# Patient Record
Sex: Male | Born: 1980
Health system: Southern US, Community
[De-identification: ages and names within clinical notes are randomized; demographics above are authoritative.]

## PROBLEM LIST (undated history)

## (undated) DIAGNOSIS — Z8669 Personal history of other diseases of the nervous system and sense organs: Secondary | ICD-10-CM

## (undated) DIAGNOSIS — I10 Essential (primary) hypertension: Secondary | ICD-10-CM

## (undated) DIAGNOSIS — E119 Type 2 diabetes mellitus without complications: Secondary | ICD-10-CM

## (undated) DIAGNOSIS — E039 Hypothyroidism, unspecified: Secondary | ICD-10-CM

## (undated) DIAGNOSIS — M7989 Other specified soft tissue disorders: Secondary | ICD-10-CM

## (undated) DIAGNOSIS — E079 Disorder of thyroid, unspecified: Secondary | ICD-10-CM

## (undated) DIAGNOSIS — R0602 Shortness of breath: Secondary | ICD-10-CM

## (undated) HISTORY — DX: Type 2 diabetes mellitus without complications: E11.9

## (undated) HISTORY — DX: Disorder of thyroid, unspecified: E07.9

## (undated) HISTORY — DX: Hypothyroidism, unspecified: E03.9

## (undated) HISTORY — DX: Personal history of other diseases of the nervous system and sense organs: Z86.69

## (undated) HISTORY — DX: Shortness of breath: R06.02

## (undated) HISTORY — DX: Other specified soft tissue disorders: M79.89

## (undated) HISTORY — DX: Essential (primary) hypertension: I10

---

## 2005-08-05 ENCOUNTER — Encounter: Admission: RE | Admit: 2005-08-05 | Discharge: 2005-09-21 | Payer: Self-pay | Admitting: Family Medicine

## 2010-12-20 ENCOUNTER — Encounter: Payer: Self-pay | Attending: Endocrinology | Admitting: Dietician

## 2013-09-22 HISTORY — PX: WISDOM TOOTH EXTRACTION: SHX21

## 2014-01-10 ENCOUNTER — Ambulatory Visit (INDEPENDENT_AMBULATORY_CARE_PROVIDER_SITE_OTHER): Payer: Managed Care, Other (non HMO)

## 2014-01-10 ENCOUNTER — Encounter: Payer: Self-pay | Admitting: Podiatry

## 2014-01-10 ENCOUNTER — Ambulatory Visit (INDEPENDENT_AMBULATORY_CARE_PROVIDER_SITE_OTHER): Payer: Managed Care, Other (non HMO) | Admitting: Podiatry

## 2014-01-10 VITALS — BP 138/84 | HR 81 | Resp 16 | Ht 68.5 in | Wt 228.0 lb

## 2014-01-10 DIAGNOSIS — M79672 Pain in left foot: Secondary | ICD-10-CM

## 2014-01-10 DIAGNOSIS — M79609 Pain in unspecified limb: Secondary | ICD-10-CM

## 2014-01-10 DIAGNOSIS — M79671 Pain in right foot: Secondary | ICD-10-CM

## 2014-01-10 DIAGNOSIS — L600 Ingrowing nail: Secondary | ICD-10-CM

## 2014-01-10 MED ORDER — NEOMYCIN-POLYMYXIN-HC 3.5-10000-1 OT SOLN: OTIC | Status: DC

## 2014-01-10 NOTE — Patient Instructions (Signed)

## 2014-01-10 NOTE — Progress Notes (Signed)
   Subjective:    Patient ID: Andre Brown, male    DOB: 1981/08/27, 33 y.o.   MRN: [id number]  HPI Comments: i am a diabetic and i noticed the other night that my right great toenail had pus coming out of it. Both great toenails are sore. The right toe has got better, was soaking in epsom salt.   Last a1c was 7.8   Review of Systems  Endocrine:       Diabetic  All other systems reviewed and are negative.      Objective:   Physical Exam: I reviewed his past medical history medications allergies surgeries social history and review of systems. Pulses are strongly palpable bilateral. Neurologic sensorium is intact per Semmes-Weinstein monofilament. Deep tendon reflexes are intact and brisk bilateral. Muscle strength is 5 over 5 dorsiflexors plantar flexors inverters everters all intrinsic musculature is intact orthopedic evaluation demonstrates all joints distal to the ankle a full range of motion without crepitation. Radiographic evaluation does not demonstrate any type of osseous abnormalities. Cutaneous evaluation does demonstrate sharp incurvated nail margins with mild erythema to the tibial border hallux bilateral.        Assessment & Plan:  Assessment: Ingrown nail hallux bilateral with insulin-dependent diabetes mellitus.  Plan: We discussed the etiology pathology conservative versus surgical therapies. At this point we did discuss the pros and cons of performing a chemical matrixectomy. At this point I believe the benefits outweigh the risks. So today we performed a chemical matrixectomy to the hallux tibial border with local anesthetic. He tolerated procedure well and I expect him to heal fully from this. He will watch his sugars very carefully and will followup with me in one week he will soak twice daily in Betadine and water and Cortisporin Otic will be applied for which we have called in a prescription. I will followup with him in one week should he develop fever chills  nausea or vomiting increase in his blood sugar or pain in his toes he'll notify us immediately.

## 2014-01-17 ENCOUNTER — Encounter: Payer: Self-pay | Admitting: Podiatry

## 2014-01-17 ENCOUNTER — Ambulatory Visit (INDEPENDENT_AMBULATORY_CARE_PROVIDER_SITE_OTHER): Payer: Managed Care, Other (non HMO) | Admitting: Podiatry

## 2014-01-17 VITALS — BP 156/80 | HR 76 | Resp 12

## 2014-01-17 DIAGNOSIS — Z9889 Other specified postprocedural states: Secondary | ICD-10-CM

## 2014-01-17 NOTE — Progress Notes (Signed)
He presents today for followup of her matrixectomy hallux bilateral lateral corners. He denies fever chills nausea vomiting muscle aches and pains he states he continues to soak in Betadine and water. He continues to Cortisporin otic as directed.  Objective: Vital signs are stable he is alert and oriented x3. Pulses are strongly palpable bilateral. There is no erythema edema cellulitis drainage or odor at the surgical sites. The appear to be healing nicely.  Assessment: Well-healing surgical toes hallux bilateral.  Plan: Discontinue the use of Betadine start with Epsom salts and water continue to soak twice daily apply Cortisporin otic twice daily covered in today only continue to do all this until completely healed.

## 2014-01-27 ENCOUNTER — Ambulatory Visit: Payer: Self-pay | Admitting: Podiatrist

## 2014-03-28 ENCOUNTER — Encounter: Payer: Self-pay | Admitting: Podiatry

## 2014-03-28 ENCOUNTER — Ambulatory Visit (INDEPENDENT_AMBULATORY_CARE_PROVIDER_SITE_OTHER): Payer: Managed Care, Other (non HMO) | Admitting: Podiatry

## 2014-03-28 VITALS — BP 136/86 | HR 65 | Resp 16

## 2014-03-28 DIAGNOSIS — L600 Ingrowing nail: Secondary | ICD-10-CM

## 2014-03-28 NOTE — Progress Notes (Signed)
He presents today with a chief complaint of a painful red tibial border to the hallux right. Several months ago we had performed a chemical matrixectomy bilaterally the left foot the great the right foot at some issues.  Objective: Vital signs are stable he is alert and oriented x3. Erythema and edema along the tibial border of the hallux right with exquisite pain.  Assessment: Probably has a spicule of nail with mild paronychia hallux right tibial border.  Plan: At this point we performed a small I&D with excision of a nail spicule this was performed after 3 cc of a 50-50 mixture of Marcaine plain and lidocaine plain was injected in a hallux block. He tolerated procedure well and I will followup with him in one week if necessary or on a when necessary basis.

## 2014-04-04 ENCOUNTER — Ambulatory Visit: Payer: Managed Care, Other (non HMO) | Admitting: Podiatry

## 2014-08-05 ENCOUNTER — Ambulatory Visit (INDEPENDENT_AMBULATORY_CARE_PROVIDER_SITE_OTHER): Payer: Managed Care, Other (non HMO) | Admitting: Family Medicine

## 2014-08-05 VITALS — BP 112/70 | HR 75 | Temp 98.4°F | Resp 18 | Ht 69.0 in | Wt 228.6 lb

## 2014-08-05 DIAGNOSIS — R3915 Urgency of urination: Secondary | ICD-10-CM

## 2014-08-05 DIAGNOSIS — M545 Low back pain, unspecified: Secondary | ICD-10-CM

## 2014-08-05 DIAGNOSIS — E119 Type 2 diabetes mellitus without complications: Secondary | ICD-10-CM

## 2014-08-05 LAB — POCT UA - MICROSCOPIC ONLY
Bacteria, U Microscopic: NEGATIVE
CRYSTALS, UR, HPF, POC: NEGATIVE
Casts, Ur, LPF, POC: NEGATIVE
EPITHELIAL CELLS, URINE PER MICROSCOPY: NEGATIVE
Mucus, UA: NEGATIVE
RBC, urine, microscopic: NEGATIVE
WBC, UR, HPF, POC: NEGATIVE
Yeast, UA: NEGATIVE

## 2014-08-05 LAB — POCT CBC
Granulocyte percent: 61.5 %G (ref 37–80)
HCT, POC: 49 % (ref 43.5–53.7)
Hemoglobin: 15.9 g/dL (ref 14.1–18.1)
LYMPH, POC: 2.4 (ref 0.6–3.4)
MCH: 29 pg (ref 27–31.2)
MCHC: 32.4 g/dL (ref 31.8–35.4)
MCV: 86.5 fL (ref 80–97)
MID (CBC): 0.4 (ref 0–0.9)
MPV: 8.7 fL (ref 0–99.8)
PLATELET COUNT, POC: 257 10*3/uL (ref 142–424)
POC GRANULOCYTE: 4.5 (ref 2–6.9)
POC LYMPH %: 33.1 % (ref 10–50)
POC MID %: 5.4 %M (ref 0–12)
RBC: 5.47 M/uL (ref 4.69–6.13)
RDW, POC: 13 %
WBC: 7.3 10*3/uL (ref 4.6–10.2)

## 2014-08-05 LAB — POCT URINALYSIS DIPSTICK
Bilirubin, UA: NEGATIVE
GLUCOSE UA: NEGATIVE
Leukocytes, UA: NEGATIVE
NITRITE UA: NEGATIVE
PROTEIN UA: NEGATIVE
Spec Grav, UA: >=1.03
UROBILINOGEN UA: 0.2
pH, UA: 5.5

## 2014-08-05 MED ORDER — CIPROFLOXACIN HCL 500 MG PO TABS: 500.0000 mg | ORAL_TABLET | Freq: Two times a day (BID) | ORAL | Status: DC

## 2014-08-05 NOTE — Patient Instructions (Signed)
We can start antibiotic for possible prostate infection, but if PSA blood test normal (should receive a call in next few days), could stop antibiotic at that time. Will call you about other tests. Remind us when we call you with these results, and we can send these to your primary provider and endocrinologist. Return to the clinic or go to the nearest emergency room if any of your symptoms worsen or new symptoms occur.

## 2014-08-05 NOTE — Progress Notes (Signed)
Subjective:    Patient ID: Andre Brown, male    DOB: [birth year], 33 y.o.   MRN: [id number] This chart was scribed for Shade Flood, MD by Julian Hy, ED Scribe. The patient was seen in Room 9. The patient's care was started at 1:54 PM.   08/05/2014  Chief Complaint  Patient presents with  . Back Pain    lower back pain x1 week or more  . Urinary Frequency    pt states he has an strong urgency to go; however often it is only a small urine flow; denies fever/chills  . Other    pt wants lab work done as well     HPI HPI Comments: He is a new pt to our office. Andre Brown is a 33 y.o. male who presents to the [facility] complaining of new, constant, moderate gradually worsening lower back pain onset one week ago. Pt denies trauma or injury. He localizes his pain to the middle of his lower back bilaterally. Pt has associated pressure with urination and urgency. He states it feels like he's been holding his urine all day regardless of whether he recently urinated or not. He notes his wife had a UTI about 1.5 week ago before his symptoms began. He denies taking any medications to relieve his symptoms. He denies having past medical history of back pain.  Pt has a hx of diabetes and is followed Dr. Talmage Nap. He last saw Dr. Talmage Nap 4-5 months ago with an A1C of 7.1. Pt notes he regularly takes Lantus and his diabetes is controlled at this time. He denies past medical history of prostate infection, UTI, or bladder infection. He has a past medical history of gonorrhea several years ago. He denies concern of STI at this time. Pt denies dysuria, dyschezia, nausea, vomiting, fevers, or chills.  PCP: Dr. Juline Patch at [facility]   There are no active problems to display for this patient.  Past Medical History  Diagnosis Date  . Diabetes mellitus without complication   . Hypertension   . Thyroid disease    History reviewed. No pertinent past surgical  history. No Known Allergies Prior to Admission medications   Medication Sig Start Date End Date Taking? Authorizing Provider  Insulin Aspart (NOVOLOG FLEXPEN [place]) Inject into the skin.   Yes Historical Provider, MD  insulin glargine (LANTUS) 100 UNIT/ML injection Inject 90 Units into the skin at bedtime.   Yes Historical Provider, MD  levothyroxine (SYNTHROID, LEVOTHROID) 25 MCG tablet Take 25 mcg by mouth daily before breakfast.   Yes Historical Provider, MD  lisinopril (PRINIVIL,ZESTRIL) 10 MG tablet Take 10 mg by mouth daily.   Yes Historical Provider, MD   History   Social History  . Marital Status: Married    Spouse Name: N/A    Number of Children: N/A  . Years of Education: N/A   Occupational History  . Not on file.   Social History Main Topics  . Smoking status: Never Smoker   . Smokeless tobacco: Never Used  . Alcohol Use: Yes  . Drug Use: No  . Sexual Activity: Not on file   Other Topics Concern  . Not on file   Social History Narrative     Review of Systems  Constitutional: Negative for fever.  Gastrointestinal: Negative for nausea and vomiting.  Genitourinary: Positive for urgency. Negative for dysuria.  Musculoskeletal: Positive for back pain.   Objective:   Filed Vitals:   08/05/14 1332  BP: 112/70  Pulse: 75  Temp: 98.4 F (36.9 C)  TempSrc: Oral  Resp: 18  Height: 5\' 9"  (1.753 m)  Weight: 228 lb 9.6 oz (103.692 kg)  SpO2: 98%    Physical Exam  Constitutional: He is oriented to person, place, and time. He appears well-developed and well-nourished.  HENT:  Head: Normocephalic and atraumatic.  Eyes: EOM are normal. Pupils are equal, round, and reactive to light.  Neck: No JVD present. Carotid bruit is not present.  Cardiovascular: Normal rate, regular rhythm and normal heart sounds.   No murmur heard. Pulmonary/Chest: Effort normal and breath sounds normal. He has no rales.  Abdominal: Soft. There is no rebound and no guarding.  Suprapubic  tenderness. Flat bowel sounds.  Genitourinary:  Slight discomfort with exam, but no apparent enlargement or nodularity on exam.   Musculoskeletal: He exhibits no edema.  The are denoted for pain is on the CVA but he has no CVA tenderness upon exam. FROM of low back.  Neurological: He is alert and oriented to person, place, and time.  Skin: Skin is warm and dry.  Psychiatric: He has a normal mood and affect.  Vitals reviewed.  Results for orders placed or performed in visit on 08/05/14  POCT CBC  Result Value Ref Range   WBC 7.3 4.6 - 10.2 K/uL   Lymph, poc 2.4 0.6 - 3.4   POC LYMPH PERCENT 33.1 10 - 50 %L   MID (cbc) 0.4 0 - 0.9   POC MID % 5.4 0 - 12 %M   POC Granulocyte 4.5 2 - 6.9   Granulocyte percent 61.5 37 - 80 %G   RBC 5.47 4.69 - 6.13 M/uL   Hemoglobin 15.9 14.1 - 18.1 g/dL   HCT, POC 16.149.0 09.643.5 - 53.7 %   MCV 86.5 80 - 97 fL   MCH, POC 29.0 27 - 31.2 pg   MCHC 32.4 31.8 - 35.4 g/dL   RDW, POC 04.513.0 %   Platelet Count, POC 257 142 - 424 K/uL   MPV 8.7 0 - 99.8 fL  POCT urinalysis dipstick  Result Value Ref Range   Color, UA yellow    Clarity, UA clear    Glucose, UA neg    Bilirubin, UA neg    Ketones, UA trace    Spec Grav, UA >=1.030    Blood, UA trace-intact    pH, UA 5.5    Protein, UA neg    Urobilinogen, UA 0.2    Nitrite, UA neg    Leukocytes, UA Negative   POCT UA - Microscopic Only  Result Value Ref Range   WBC, Ur, HPF, POC neg    RBC, urine, microscopic neg    Bacteria, U Microscopic neg    Mucus, UA neg    Epithelial cells, urine per micros neg    Crystals, Ur, HPF, POC neg    Casts, Ur, LPF, POC neg    Yeast, UA neg        Assessment & Plan:  2:03 PM- Patient informed of current plan for treatment and evaluation and agrees with plan at this time. Andre JabsShamone T Kimberling is a 33 y.o. male Bilateral low back pain without sciatica - Plan: POCT CBC, Basic metabolic panel, PSA, POCT urinalysis dipstick, POCT UA - Microscopic Only, ciprofloxacin  (CIPRO) 500 MG tablet, Urine culture  Urinary urgency - Plan: POCT CBC, Basic metabolic panel, PSA, POCT urinalysis dipstick, POCT UA - Microscopic Only, ciprofloxacin (CIPRO) 500 MG tablet, Urine culture  Type 2 diabetes mellitus without complication   Possible early prostatitis, with urgency and back symptoms. Options discussed with waiting on PSA result, then starting abx if possible, or starting cipro now, then D/c if PSA ok. Decided to start Cipro, check BMP (underlying DM, check renal function), and urine culture, but reassuring U/A overall in office.  Back care manual given for possible MSK cause of pain. SED and tendon precautions/risks discussed with Cipro. RTC precautions discussed.   Pt requests outside labs to be sent to PCP and endocrinologist.    Meds ordered this encounter  Medications  . ciprofloxacin (CIPRO) 500 MG tablet    Sig: Take 1 tablet (500 mg total) by mouth 2 (two) times daily.    Dispense:  20 tablet    Refill:  0   Patient Instructions  We can start antibiotic for possible prostate infection, but if PSA blood test normal (should receive a call in next few days), could stop antibiotic at that time. Will call you about other tests. Remind us when we call you with these results, and we can send these to your primary provider and endocrinologist. Return to the clinic or go to the nearest emergency room if any of your symptoms worsen or new symptoms occur.

## 2014-08-06 LAB — BASIC METABOLIC PANEL
BUN: 9 mg/dL (ref 6–23)
CALCIUM: 9.6 mg/dL (ref 8.4–10.5)
CO2: 27 meq/L (ref 19–32)
CREATININE: 1 mg/dL (ref 0.50–1.35)
Chloride: 101 mEq/L (ref 96–112)
Glucose, Bld: 103 mg/dL — ABNORMAL HIGH (ref 70–99)
Potassium: 4.4 mEq/L (ref 3.5–5.3)
SODIUM: 138 meq/L (ref 135–145)

## 2014-08-06 LAB — URINE CULTURE
COLONY COUNT: NO GROWTH
ORGANISM ID, BACTERIA: NO GROWTH

## 2014-08-07 LAB — PSA: PSA: 0.59 ng/mL (ref <=4.00)

## 2014-12-14 ENCOUNTER — Other Ambulatory Visit: Payer: Self-pay | Admitting: Internal Medicine

## 2014-12-14 DIAGNOSIS — E079 Disorder of thyroid, unspecified: Secondary | ICD-10-CM

## 2014-12-20 ENCOUNTER — Ambulatory Visit
Admission: RE | Admit: 2014-12-20 | Discharge: 2014-12-20 | Disposition: A | Discharge status: Home / Self Care | Payer: BLUE CROSS/BLUE SHIELD | Source: Ambulatory Visit | Attending: Internal Medicine | Admitting: Internal Medicine

## 2014-12-20 DIAGNOSIS — E079 Disorder of thyroid, unspecified: Secondary | ICD-10-CM

## 2015-12-27 DIAGNOSIS — I10 Essential (primary) hypertension: Secondary | ICD-10-CM | POA: Diagnosis not present

## 2015-12-27 DIAGNOSIS — E039 Hypothyroidism, unspecified: Secondary | ICD-10-CM | POA: Diagnosis not present

## 2015-12-27 DIAGNOSIS — E109 Type 1 diabetes mellitus without complications: Secondary | ICD-10-CM | POA: Diagnosis not present

## 2015-12-27 DIAGNOSIS — E7800 Pure hypercholesterolemia, unspecified: Secondary | ICD-10-CM | POA: Diagnosis not present

## 2016-06-30 DIAGNOSIS — E039 Hypothyroidism, unspecified: Secondary | ICD-10-CM | POA: Diagnosis not present

## 2016-06-30 DIAGNOSIS — E7800 Pure hypercholesterolemia, unspecified: Secondary | ICD-10-CM | POA: Diagnosis not present

## 2016-06-30 DIAGNOSIS — E109 Type 1 diabetes mellitus without complications: Secondary | ICD-10-CM | POA: Diagnosis not present

## 2016-06-30 DIAGNOSIS — R809 Proteinuria, unspecified: Secondary | ICD-10-CM | POA: Diagnosis not present

## 2016-07-17 DIAGNOSIS — E109 Type 1 diabetes mellitus without complications: Secondary | ICD-10-CM | POA: Diagnosis not present

## 2016-11-03 DIAGNOSIS — E7800 Pure hypercholesterolemia, unspecified: Secondary | ICD-10-CM | POA: Diagnosis not present

## 2016-11-03 DIAGNOSIS — E559 Vitamin D deficiency, unspecified: Secondary | ICD-10-CM | POA: Diagnosis not present

## 2016-11-03 DIAGNOSIS — E039 Hypothyroidism, unspecified: Secondary | ICD-10-CM | POA: Diagnosis not present

## 2016-11-03 DIAGNOSIS — I10 Essential (primary) hypertension: Secondary | ICD-10-CM | POA: Diagnosis not present

## 2016-11-03 DIAGNOSIS — E109 Type 1 diabetes mellitus without complications: Secondary | ICD-10-CM | POA: Diagnosis not present

## 2016-11-12 DIAGNOSIS — F4321 Adjustment disorder with depressed mood: Secondary | ICD-10-CM | POA: Diagnosis not present

## 2016-11-19 DIAGNOSIS — F4321 Adjustment disorder with depressed mood: Secondary | ICD-10-CM | POA: Diagnosis not present

## 2016-11-25 DIAGNOSIS — F4321 Adjustment disorder with depressed mood: Secondary | ICD-10-CM | POA: Diagnosis not present

## 2016-12-02 DIAGNOSIS — F4321 Adjustment disorder with depressed mood: Secondary | ICD-10-CM | POA: Diagnosis not present

## 2016-12-08 DIAGNOSIS — F4321 Adjustment disorder with depressed mood: Secondary | ICD-10-CM | POA: Diagnosis not present

## 2016-12-18 DIAGNOSIS — F4321 Adjustment disorder with depressed mood: Secondary | ICD-10-CM | POA: Diagnosis not present

## 2016-12-25 DIAGNOSIS — F4321 Adjustment disorder with depressed mood: Secondary | ICD-10-CM | POA: Diagnosis not present

## 2017-01-06 DIAGNOSIS — F4321 Adjustment disorder with depressed mood: Secondary | ICD-10-CM | POA: Diagnosis not present

## 2017-01-12 DIAGNOSIS — F4321 Adjustment disorder with depressed mood: Secondary | ICD-10-CM | POA: Diagnosis not present

## 2017-01-16 DIAGNOSIS — Z0000 Encounter for general adult medical examination without abnormal findings: Secondary | ICD-10-CM | POA: Diagnosis not present

## 2017-01-16 DIAGNOSIS — E109 Type 1 diabetes mellitus without complications: Secondary | ICD-10-CM | POA: Diagnosis not present

## 2017-01-16 DIAGNOSIS — E039 Hypothyroidism, unspecified: Secondary | ICD-10-CM | POA: Diagnosis not present

## 2017-01-16 DIAGNOSIS — N529 Male erectile dysfunction, unspecified: Secondary | ICD-10-CM | POA: Diagnosis not present

## 2017-01-16 DIAGNOSIS — I10 Essential (primary) hypertension: Secondary | ICD-10-CM | POA: Diagnosis not present

## 2017-02-19 DIAGNOSIS — E039 Hypothyroidism, unspecified: Secondary | ICD-10-CM | POA: Diagnosis not present

## 2017-02-19 DIAGNOSIS — R072 Precordial pain: Secondary | ICD-10-CM | POA: Diagnosis not present

## 2017-02-19 DIAGNOSIS — R42 Dizziness and giddiness: Secondary | ICD-10-CM | POA: Diagnosis not present

## 2017-02-19 DIAGNOSIS — R030 Elevated blood-pressure reading, without diagnosis of hypertension: Secondary | ICD-10-CM | POA: Diagnosis not present

## 2017-02-23 DIAGNOSIS — E039 Hypothyroidism, unspecified: Secondary | ICD-10-CM | POA: Diagnosis not present

## 2017-04-20 DIAGNOSIS — Z6835 Body mass index (BMI) 35.0-35.9, adult: Secondary | ICD-10-CM | POA: Diagnosis not present

## 2017-04-20 DIAGNOSIS — E109 Type 1 diabetes mellitus without complications: Secondary | ICD-10-CM | POA: Diagnosis not present

## 2017-04-20 DIAGNOSIS — I10 Essential (primary) hypertension: Secondary | ICD-10-CM | POA: Diagnosis not present

## 2017-05-04 DIAGNOSIS — E039 Hypothyroidism, unspecified: Secondary | ICD-10-CM | POA: Diagnosis not present

## 2017-05-04 DIAGNOSIS — E109 Type 1 diabetes mellitus without complications: Secondary | ICD-10-CM | POA: Diagnosis not present

## 2017-05-04 DIAGNOSIS — E7800 Pure hypercholesterolemia, unspecified: Secondary | ICD-10-CM | POA: Diagnosis not present

## 2017-05-04 DIAGNOSIS — I10 Essential (primary) hypertension: Secondary | ICD-10-CM | POA: Diagnosis not present

## 2017-07-31 DIAGNOSIS — F4321 Adjustment disorder with depressed mood: Secondary | ICD-10-CM | POA: Diagnosis not present

## 2017-08-07 DIAGNOSIS — F4321 Adjustment disorder with depressed mood: Secondary | ICD-10-CM | POA: Diagnosis not present

## 2017-08-20 DIAGNOSIS — F4321 Adjustment disorder with depressed mood: Secondary | ICD-10-CM | POA: Diagnosis not present

## 2017-08-26 DIAGNOSIS — F4321 Adjustment disorder with depressed mood: Secondary | ICD-10-CM | POA: Diagnosis not present

## 2017-09-25 DIAGNOSIS — E7800 Pure hypercholesterolemia, unspecified: Secondary | ICD-10-CM | POA: Diagnosis not present

## 2017-09-25 DIAGNOSIS — I10 Essential (primary) hypertension: Secondary | ICD-10-CM | POA: Diagnosis not present

## 2017-09-25 DIAGNOSIS — E109 Type 1 diabetes mellitus without complications: Secondary | ICD-10-CM | POA: Diagnosis not present

## 2017-09-25 DIAGNOSIS — E039 Hypothyroidism, unspecified: Secondary | ICD-10-CM | POA: Diagnosis not present

## 2017-10-20 DIAGNOSIS — F4321 Adjustment disorder with depressed mood: Secondary | ICD-10-CM | POA: Diagnosis not present

## 2017-10-27 DIAGNOSIS — F4321 Adjustment disorder with depressed mood: Secondary | ICD-10-CM | POA: Diagnosis not present

## 2017-11-03 DIAGNOSIS — F4321 Adjustment disorder with depressed mood: Secondary | ICD-10-CM | POA: Diagnosis not present

## 2017-11-19 DIAGNOSIS — E039 Hypothyroidism, unspecified: Secondary | ICD-10-CM | POA: Diagnosis not present

## 2017-11-24 DIAGNOSIS — F4321 Adjustment disorder with depressed mood: Secondary | ICD-10-CM | POA: Diagnosis not present

## 2017-12-23 DIAGNOSIS — F4321 Adjustment disorder with depressed mood: Secondary | ICD-10-CM | POA: Diagnosis not present

## 2018-01-13 DIAGNOSIS — F4321 Adjustment disorder with depressed mood: Secondary | ICD-10-CM | POA: Diagnosis not present

## 2018-01-19 DIAGNOSIS — Z0000 Encounter for general adult medical examination without abnormal findings: Secondary | ICD-10-CM | POA: Diagnosis not present

## 2018-01-19 DIAGNOSIS — E039 Hypothyroidism, unspecified: Secondary | ICD-10-CM | POA: Diagnosis not present

## 2018-01-19 DIAGNOSIS — I10 Essential (primary) hypertension: Secondary | ICD-10-CM | POA: Diagnosis not present

## 2018-01-19 DIAGNOSIS — E109 Type 1 diabetes mellitus without complications: Secondary | ICD-10-CM | POA: Diagnosis not present

## 2018-01-19 DIAGNOSIS — E7800 Pure hypercholesterolemia, unspecified: Secondary | ICD-10-CM | POA: Diagnosis not present

## 2018-01-20 DIAGNOSIS — M5386 Other specified dorsopathies, lumbar region: Secondary | ICD-10-CM | POA: Diagnosis not present

## 2018-01-20 DIAGNOSIS — Z0000 Encounter for general adult medical examination without abnormal findings: Secondary | ICD-10-CM | POA: Diagnosis not present

## 2018-01-20 DIAGNOSIS — M545 Low back pain: Secondary | ICD-10-CM | POA: Diagnosis not present

## 2018-01-20 DIAGNOSIS — E109 Type 1 diabetes mellitus without complications: Secondary | ICD-10-CM | POA: Diagnosis not present

## 2018-02-17 ENCOUNTER — Ambulatory Visit: Payer: BLUE CROSS/BLUE SHIELD | Admitting: *Deleted

## 2018-02-25 ENCOUNTER — Ambulatory Visit: Payer: BLUE CROSS/BLUE SHIELD | Admitting: Registered"

## 2018-03-09 DIAGNOSIS — L989 Disorder of the skin and subcutaneous tissue, unspecified: Secondary | ICD-10-CM | POA: Diagnosis not present

## 2018-03-09 DIAGNOSIS — Z794 Long term (current) use of insulin: Secondary | ICD-10-CM | POA: Diagnosis not present

## 2018-03-09 DIAGNOSIS — E1065 Type 1 diabetes mellitus with hyperglycemia: Secondary | ICD-10-CM | POA: Diagnosis not present

## 2018-03-09 DIAGNOSIS — E039 Hypothyroidism, unspecified: Secondary | ICD-10-CM | POA: Diagnosis not present

## 2018-03-10 ENCOUNTER — Encounter: Payer: BLUE CROSS/BLUE SHIELD | Attending: Internal Medicine | Admitting: *Deleted

## 2018-03-10 DIAGNOSIS — Z713 Dietary counseling and surveillance: Secondary | ICD-10-CM | POA: Insufficient documentation

## 2018-03-10 DIAGNOSIS — E119 Type 2 diabetes mellitus without complications: Secondary | ICD-10-CM | POA: Diagnosis not present

## 2018-03-10 NOTE — Patient Instructions (Signed)
Plan:  We discussed using 15 grams of carbohydrate as a bench mark for carb choices and you can use the food group to help with this  Include protein in moderation with your meals and snacks Consider reading food labels for Total Carbohydrate of foods Continue with your activity level daily as tolerated Consider checking BG with Libre at alternate times per day   Continue taking medication as directed by MD

## 2018-03-18 NOTE — Progress Notes (Signed)
Diabetes Self-Management Education  Visit Type: First/Initial  Appt. Start Time: 1115 Appt. End Time: 1245  03/18/2018  [name], identified by name and date of birth, is a 37 y.o. male with a diagnosis of Diabetes: Type 2. 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 Diet history shows good variety of all food groups and beverages are mostly water. He gets some activity daily with walking and lifting car parts, but finds he winds up with hypoglycemia during the night if he works out at [employer] He uses the [employer] CGM instead of fingersticks.   ASSESSMENT  Height 5\' 8"  (1.727 m), weight 234 lb 3.2 oz (106.2 kg). Body mass index is 35.61 kg/m.  Diabetes Self-Management Education - 03/18/18 1521      Visit Information   Visit Type  First/Initial      Initial Visit   Diabetes Type  Type 2    Are you currently following a meal plan?  No    Are you taking your medications as prescribed?  Yes    Date Diagnosed  age 89 (2001)      Health Coping   How would you rate your overall health?  Good      Psychosocial Assessment   Patient Belief/Attitude about Diabetes  Motivated to manage diabetes    Other persons present  Patient    Patient Concerns  Nutrition/Meal planning;Glycemic Control;Weight Control    Special Needs  None    Learning Readiness  Change in progress      Pre-Education Assessment   Patient understands the diabetes disease and treatment process.  Needs Review    Patient understands incorporating nutritional management into lifestyle.  Needs Instruction    Patient undertands incorporating physical activity into lifestyle.  Needs Review    Patient understands using medications safely.  Needs Review    Patient understands monitoring blood glucose, interpreting and using results  Demonstrates understanding / competency    Patient understands prevention, detection, and treatment of acute complications.   Demonstrates understanding / competency    Patient understands prevention, detection, and treatment of chronic complications.  Demonstrates understanding / competency    Patient understands how to develop strategies to address psychosocial issues.  Demonstrates understanding / competency    Patient understands how to develop strategies to promote health/change behavior.  Demonstrates understanding / competency      Complications   Last HgB A1C per patient/outside source  8 %    How often do you check your blood sugar?  > 4 times/day Has Libre CGM    Have you had a dilated eye exam in the past 12 months?  Yes    Have you had a dental exam in the past 12 months?  Yes    Are you checking your feet?  Yes    How many days per week are you checking your feet?  1      Dietary Intake   Breakfast  regular oatmeal in 12 oz bowl OR bacon, eggs sandwich. will have juice if BG is low    Lunch  chicken with veggies and sweet potato fries OR deli sandwich with lean meat on whole grain bread OR frozen convenience meal x 2    Snack (afternoon)  [place] bar with almonds OR chips    Dinner  meat, starch and vegetables, occasionally with a salad    Beverage(s)  water, occasionally one beer with dinner  Exercise   Exercise Type  Light (walking / raking leaves) heavy lifting when working on cars, walks at work daily    How many days per week to you exercise?  5    How many minutes per day do you exercise?  60    Total minutes per week of exercise  300      Patient Education   Previous Diabetes Education  No    Disease state   Factors that contribute to the development of diabetes    Nutrition management   Role of diet in the treatment of diabetes and the relationship between the three main macronutrients and blood glucose level;Carbohydrate counting;Meal timing in regards to the patients' current diabetes medication.;Food label reading, portion sizes and measuring food.;Reviewed blood glucose goals for  pre and post meals and how to evaluate the patients' food intake on their blood glucose level.;Effects of alcohol on blood glucose and safety factors with consumption of alcohol.;Information on hints to eating out and maintain blood glucose control.    Physical activity and exercise   Role of exercise on diabetes management, blood pressure control and cardiac health.    Medications  Reviewed patients medication for diabetes, action, purpose, timing of dose and side effects.    Monitoring  Identified appropriate SMBG and/or A1C goals.      Individualized Goals (developed by patient)   Nutrition  Follow meal plan discussed    Physical Activity  Exercise 3-5 times per week    Medications  take my medication as prescribed    Monitoring   test blood glucose pre and post meals as discussed      Post-Education Assessment   Patient understands the diabetes disease and treatment process.  Demonstrates understanding / competency    Patient understands incorporating nutritional management into lifestyle.  Demonstrates understanding / competency    Patient undertands incorporating physical activity into lifestyle.  Demonstrates understanding / competency    Patient understands using medications safely.  Demonstrates understanding / competency    Patient understands monitoring blood glucose, interpreting and using results  Demonstrates understanding / competency    Patient understands prevention, detection, and treatment of acute complications.  Demonstrates understanding / competency    Patient understands prevention, detection, and treatment of chronic complications.  Demonstrates understanding / competency    Patient understands how to develop strategies to address psychosocial issues.  Demonstrates understanding / competency    Patient understands how to develop strategies to promote health/change behavior.  Demonstrates understanding / competency      Outcomes   Expected Outcomes  Demonstrated  interest in learning. Expect positive outcomes    Future DMSE  4-6 wks    Program Status  Completed       Individualized Plan for Diabetes Self-Management Training:   Learning Objective:  Patient will have a greater understanding of diabetes self-management. Patient education plan is to attend individual and/or group sessions per assessed needs and concerns.   Plan:   Patient Instructions  Plan:  We discussed using 15 grams of carbohydrate as a bench mark for carb choices and you can use the food group to help with this  Include protein in moderation with your meals and snacks Consider reading food labels for Total Carbohydrate of foods Continue with your activity level daily as tolerated Consider checking BG with Libre at alternate times per day   Continue taking medication as directed by MD We may consider using an Insulin to Carb ratio and a Correction Factor  in the future for you, if you are interested.  Expected Outcomes:  Demonstrated interest in learning. Expect positive outcomes  Education material provided: A1C conversion sheet, Meal plan card and Carbohydrate counting sheet, Insulin delivery options including insulin pumps  If problems or questions, patient to contact team via:  Phone  Future DSME appointment: 4-6 wks

## 2018-03-22 DIAGNOSIS — H25812 Combined forms of age-related cataract, left eye: Secondary | ICD-10-CM | POA: Diagnosis not present

## 2018-03-22 DIAGNOSIS — E109 Type 1 diabetes mellitus without complications: Secondary | ICD-10-CM | POA: Diagnosis not present

## 2018-04-27 ENCOUNTER — Encounter: Payer: BLUE CROSS/BLUE SHIELD | Attending: Internal Medicine | Admitting: *Deleted

## 2018-04-27 DIAGNOSIS — Z713 Dietary counseling and surveillance: Secondary | ICD-10-CM | POA: Diagnosis not present

## 2018-04-27 DIAGNOSIS — E119 Type 2 diabetes mellitus without complications: Secondary | ICD-10-CM | POA: Diagnosis not present

## 2018-04-27 DIAGNOSIS — E108 Type 1 diabetes mellitus with unspecified complications: Secondary | ICD-10-CM

## 2018-04-30 NOTE — Patient Instructions (Addendum)
Plan:   Contniue using 15 grams of carbohydrate as a bench mark for carb choices and you can use the food group to help with this   You appear to be eating about 60 grams of Carbohydrate at each meal so you can consider using a Carb Ratio of 1 unit / 5 grams of carbohydrate OR 3 units / Carb Choice to adjust your meal insulin based on actual food intake if OK with your MD.  Include protein in moderation with your meals and snacks  Continue reading food labels for Total Carbohydrate of foods  Continue with your activity level daily as tolerated, great job!  Consider checking BG with Libre at alternate times per day    Continue taking medication as directed by MD

## 2018-04-30 NOTE — Progress Notes (Signed)
Diabetes Self-Management Education  Visit Type: Follow-up  Appt. Start Time: 1545 Appt. End Time: 1615  04/30/2018  [name], identified by name and date of birth, is a 37 y.o. male with a diagnosis of Diabetes: Type 1. Patient states he is doing better, and he is now biking on Saturdays with a group for about 20 miles. He states he also uses his home gym equipment several days a week. He states his Basaglar insulin was decreased from 100 to only 50 units once a day. He feels his BG are higher as a result, so plans to discuss a compromise with his MD at next visit. He is Still taking 12 units of Novolog for his meals, we discussed the Correction dose of 1 unit for every 4 points above his target as being very strong. I suggested he review this with his MD as well.   ASSESSMENT  Height 5' 8.5" (1.74 m), weight 233 lb 3.2 oz (105.8 kg). Body mass index is 34.94 kg/m.  Diabetes Self-Management Education - 04/30/18 1338      Visit Information   Visit Type  Follow-up      Initial Visit   Diabetes Type  Type 1    Are you currently following a meal plan?  Yes    Are you taking your medications as prescribed?  Yes    Date Diagnosed  2001      Psychosocial Assessment   Patient Belief/Attitude about Diabetes  Motivated to manage diabetes    Self-care barriers  None    Other persons present  Patient    Patient Concerns  Glycemic Control;Medication;Monitoring;Support    Special Needs  None    Learning Readiness  Change in progress      Complications   Have you had a dilated eye exam in the past 12 months?  Yes    Have you had a dental exam in the past 12 months?  Yes      Exercise   Exercise Type  Moderate (swimming / aerobic walking)    How many days per week to you exercise?  5    How many minutes per day do you exercise?  60    Total minutes per week of exercise  300      Patient Education   Physical activity and exercise   Identified with patient nutritional and/or  medication changes necessary with exercise.    Medications  Reviewed patients medication for diabetes, action, purpose, timing of dose and side effects.    Monitoring  Other (comment)      Individualized Goals (developed by patient)   Nutrition  Follow meal plan discussed    Physical Activity  Exercise 3-5 times per week    Medications  take my medication as prescribed    Monitoring   test blood glucose pre and post meals as discussed      Patient Self-Evaluation of Goals - Patient rates self as meeting previously set goals (% of time)   Nutrition  >75%    Physical Activity  >75%    Medications  >75%    Monitoring  >75%    Problem Solving  >75%    Reducing Risk  >75%    Health Coping  >75%      Post-Education Assessment   Patient understands the diabetes disease and treatment process.  Demonstrates understanding / competency    Patient understands incorporating nutritional management into lifestyle.  Demonstrates understanding / competency    Patient undertands incorporating  physical activity into lifestyle.  Demonstrates understanding / competency    Patient understands using medications safely.  Demonstrates understanding / competency    Patient understands monitoring blood glucose, interpreting and using results  Demonstrates understanding / competency    Patient understands prevention, detection, and treatment of acute complications.  Demonstrates understanding / competency    Patient understands prevention, detection, and treatment of chronic complications.  Demonstrates understanding / competency    Patient understands how to develop strategies to address psychosocial issues.  Demonstrates understanding / competency    Patient understands how to develop strategies to promote health/change behavior.  Demonstrates understanding / competency      Outcomes   Expected Outcomes  Demonstrated interest in learning. Expect positive outcomes    Future DMSE  PRN    Program Status   Completed      Subsequent Visit   Since your last visit have you continued or begun to take your medications as prescribed?  Yes    Since your last visit have you experienced any weight changes?  No change    Since your last visit, are you checking your blood glucose at least once a day?  Yes       Individualized Plan for Diabetes Self-Management Training:   Learning Objective:  Patient will have a greater understanding of diabetes self-management. Patient education plan is to attend individual and/or group sessions per assessed needs and concerns.   Plan:   Patient Instructions  Plan:   Contniue using 15 grams of carbohydrate as a bench mark for carb choices and you can use the food group to help with this   You appear to be eating about 60 grams of Carbohydrate at each meal so you can consider using a Carb Ratio of 1 unit / 5 grams of carbohydrate OR 3 units / Carb Choice to adjust your meal insulin based on actual food intake if OK with your MD.  Include protein in moderation with your meals and snacks  Continue reading food labels for Total Carbohydrate of foods  Continue with your activity level daily as tolerated, great job!  Consider checking BG with Libre at alternate times per day    Continue taking medication as directed by MD  Expected Outcomes:  Demonstrated interest in learning. Expect positive outcomes  Education material provided: No new handouts today  If problems or questions, patient to contact team via:  Phone  Future DSME appointment: PRN (Informed him of Support Group)

## 2018-06-08 DIAGNOSIS — L819 Disorder of pigmentation, unspecified: Secondary | ICD-10-CM | POA: Diagnosis not present

## 2018-06-08 DIAGNOSIS — E039 Hypothyroidism, unspecified: Secondary | ICD-10-CM | POA: Diagnosis not present

## 2018-06-08 DIAGNOSIS — E1065 Type 1 diabetes mellitus with hyperglycemia: Secondary | ICD-10-CM | POA: Diagnosis not present

## 2018-07-02 DIAGNOSIS — H25813 Combined forms of age-related cataract, bilateral: Secondary | ICD-10-CM | POA: Diagnosis not present

## 2018-09-22 ENCOUNTER — Emergency Department (HOSPITAL_COMMUNITY): Payer: BLUE CROSS/BLUE SHIELD

## 2018-09-22 ENCOUNTER — Emergency Department (HOSPITAL_COMMUNITY)
Admission: EM | Admit: 2018-09-22 | Discharge: 2018-09-23 | Disposition: A | Discharge status: Home / Self Care | Payer: BLUE CROSS/BLUE SHIELD | Attending: Emergency Medicine | Admitting: Emergency Medicine

## 2018-09-22 ENCOUNTER — Ambulatory Visit (HOSPITAL_COMMUNITY)
Admission: EM | Admit: 2018-09-22 | Discharge: 2018-09-22 | Disposition: A | Discharge status: Home / Self Care | Payer: BLUE CROSS/BLUE SHIELD | Source: Home / Self Care

## 2018-09-22 DIAGNOSIS — R51 Headache: Secondary | ICD-10-CM | POA: Insufficient documentation

## 2018-09-22 DIAGNOSIS — R519 Headache, unspecified: Secondary | ICD-10-CM

## 2018-09-22 DIAGNOSIS — R739 Hyperglycemia, unspecified: Secondary | ICD-10-CM

## 2018-09-22 DIAGNOSIS — R0602 Shortness of breath: Secondary | ICD-10-CM | POA: Diagnosis not present

## 2018-09-22 DIAGNOSIS — R400 Somnolence: Secondary | ICD-10-CM | POA: Insufficient documentation

## 2018-09-22 DIAGNOSIS — E1065 Type 1 diabetes mellitus with hyperglycemia: Secondary | ICD-10-CM | POA: Insufficient documentation

## 2018-09-22 DIAGNOSIS — Z794 Long term (current) use of insulin: Secondary | ICD-10-CM | POA: Diagnosis not present

## 2018-09-22 DIAGNOSIS — R531 Weakness: Secondary | ICD-10-CM | POA: Diagnosis not present

## 2018-09-22 DIAGNOSIS — I10 Essential (primary) hypertension: Secondary | ICD-10-CM | POA: Insufficient documentation

## 2018-09-22 DIAGNOSIS — R110 Nausea: Secondary | ICD-10-CM | POA: Diagnosis not present

## 2018-09-22 DIAGNOSIS — R21 Rash and other nonspecific skin eruption: Secondary | ICD-10-CM | POA: Insufficient documentation

## 2018-09-22 DIAGNOSIS — R4182 Altered mental status, unspecified: Secondary | ICD-10-CM | POA: Diagnosis not present

## 2018-09-22 DIAGNOSIS — Z79899 Other long term (current) drug therapy: Secondary | ICD-10-CM | POA: Diagnosis not present

## 2018-09-22 DIAGNOSIS — Z23 Encounter for immunization: Secondary | ICD-10-CM | POA: Insufficient documentation

## 2018-09-22 LAB — URINALYSIS, ROUTINE W REFLEX MICROSCOPIC
Bilirubin Urine: NEGATIVE
Glucose, UA: >=500 mg/dL — AB
Hgb urine dipstick: NEGATIVE
KETONES UR: 20 mg/dL — AB
Leukocytes, UA: NEGATIVE
Nitrite: NEGATIVE
Protein, ur: NEGATIVE mg/dL
Specific Gravity, Urine: 1.025 (ref 1.005–1.030)
pH: 6 (ref 5.0–8.0)

## 2018-09-22 LAB — RAPID URINE DRUG SCREEN, HOSP PERFORMED
Amphetamines: NOT DETECTED
BARBITURATES: NOT DETECTED
Benzodiazepines: NOT DETECTED
COCAINE: NOT DETECTED
Opiates: NOT DETECTED
TETRAHYDROCANNABINOL: NOT DETECTED

## 2018-09-22 LAB — COMPREHENSIVE METABOLIC PANEL
ALBUMIN: 4.2 g/dL (ref 3.5–5.0)
ALK PHOS: 62 U/L (ref 38–126)
ALT: 23 U/L (ref 0–44)
AST: 32 U/L (ref 15–41)
Anion gap: 10 (ref 5–15)
BILIRUBIN TOTAL: 1.4 mg/dL — AB (ref 0.3–1.2)
BUN: 12 mg/dL (ref 6–20)
CALCIUM: 9.2 mg/dL (ref 8.9–10.3)
CO2: 24 mmol/L (ref 22–32)
Chloride: 99 mmol/L (ref 98–111)
Creatinine, Ser: 1.13 mg/dL (ref 0.61–1.24)
GFR calc Af Amer: >60 mL/min (ref >60)
GFR calc non Af Amer: >60 mL/min (ref >60)
Glucose, Bld: 349 mg/dL — ABNORMAL HIGH (ref 70–99)
Potassium: 6.2 mmol/L — ABNORMAL HIGH (ref 3.5–5.1)
Sodium: 133 mmol/L — ABNORMAL LOW (ref 135–145)
TOTAL PROTEIN: 7.5 g/dL (ref 6.5–8.1)

## 2018-09-22 LAB — INFLUENZA PANEL BY PCR (TYPE A & B)
INFLBPCR: NEGATIVE
Influenza A By PCR: NEGATIVE

## 2018-09-22 LAB — CBC
HEMATOCRIT: 47.4 % (ref 39.0–52.0)
HEMOGLOBIN: 15.8 g/dL (ref 13.0–17.0)
MCH: 29.4 pg (ref 26.0–34.0)
MCHC: 33.3 g/dL (ref 30.0–36.0)
MCV: 88.3 fL (ref 80.0–100.0)
Platelets: 245 10*3/uL (ref 150–400)
RBC: 5.37 MIL/uL (ref 4.22–5.81)
RDW: 12 % (ref 11.5–15.5)
WBC: 6.9 10*3/uL (ref 4.0–10.5)
nRBC: 0 % (ref 0.0–0.2)

## 2018-09-22 LAB — CBG MONITORING, ED
Glucose-Capillary: 254 mg/dL — ABNORMAL HIGH (ref 70–99)
Glucose-Capillary: 272 mg/dL — ABNORMAL HIGH (ref 70–99)
Glucose-Capillary: 218 mg/dL — ABNORMAL HIGH (ref 70–99)
Glucose-Capillary: 233 mg/dL — ABNORMAL HIGH (ref 70–99)

## 2018-09-22 LAB — BASIC METABOLIC PANEL
Anion gap: 10 (ref 5–15)
BUN: 11 mg/dL (ref 6–20)
CALCIUM: 9.3 mg/dL (ref 8.9–10.3)
CO2: 21 mmol/L — AB (ref 22–32)
Chloride: 102 mmol/L (ref 98–111)
Creatinine, Ser: 1.12 mg/dL (ref 0.61–1.24)
GFR calc Af Amer: >60 mL/min (ref >60)
GFR calc non Af Amer: >60 mL/min (ref >60)
GLUCOSE: 381 mg/dL — AB (ref 70–99)
Potassium: 5.3 mmol/L — ABNORMAL HIGH (ref 3.5–5.1)
Sodium: 133 mmol/L — ABNORMAL LOW (ref 135–145)

## 2018-09-22 LAB — I-STAT CG4 LACTIC ACID, ED
LACTIC ACID, VENOUS: 1.44 mmol/L (ref 0.5–1.9)
Lactic Acid, Venous: 1.42 mmol/L (ref 0.5–1.9)

## 2018-09-22 LAB — I-STAT TROPONIN, ED: Troponin i, poc: 0 ng/mL (ref 0.00–0.08)

## 2018-09-22 LAB — I-STAT VENOUS BLOOD GAS, ED
ACID-BASE DEFICIT: 1 mmol/L (ref 0.0–2.0)
Bicarbonate: 24.8 mmol/L (ref 20.0–28.0)
O2 Saturation: 73 %
TCO2: 26 mmol/L (ref 22–32)
pCO2, Ven: 42.1 mmHg — ABNORMAL LOW (ref 44.0–60.0)
pH, Ven: 7.378 (ref 7.250–7.430)
pO2, Ven: 40 mmHg (ref 32.0–45.0)

## 2018-09-22 LAB — ETHANOL: Alcohol, Ethyl (B): <10 mg/dL (ref <10)

## 2018-09-22 MED ORDER — DIPHENHYDRAMINE HCL 50 MG/ML IJ SOLN
25.0000 mg | Freq: Once | INTRAMUSCULAR | Status: AC
  Administered 2018-09-22: 25 mg via INTRAVENOUS
  Filled 2018-09-22: qty 1

## 2018-09-22 MED ORDER — METOCLOPRAMIDE HCL 5 MG/ML IJ SOLN
10.0000 mg | Freq: Once | INTRAMUSCULAR | Status: AC
  Administered 2018-09-22: 10 mg via INTRAVENOUS
  Filled 2018-09-22: qty 2

## 2018-09-22 MED ORDER — GADOBUTROL 1 MMOL/ML IV SOLN
10.0000 mL | Freq: Once | INTRAVENOUS | Status: AC | PRN
  Administered 2018-09-22: 10 mL via INTRAVENOUS

## 2018-09-22 MED ORDER — TETANUS-DIPHTH-ACELL PERTUSSIS 5-2.5-18.5 LF-MCG/0.5 IM SUSP
0.5000 mL | Freq: Once | INTRAMUSCULAR | Status: AC
  Administered 2018-09-22: 0.5 mL via INTRAMUSCULAR
  Filled 2018-09-22: qty 0.5

## 2018-09-22 MED ORDER — IBUPROFEN 800 MG PO TABS
800.0000 mg | ORAL_TABLET | Freq: Once | ORAL | Status: AC
  Administered 2018-09-23: 800 mg via ORAL
  Filled 2018-09-22: qty 1

## 2018-09-22 MED ORDER — SODIUM CHLORIDE 0.9 % IV BOLUS
1000.0000 mL | Freq: Once | INTRAVENOUS | Status: AC
  Administered 2018-09-22: 1000 mL via INTRAVENOUS

## 2018-09-22 MED ORDER — HYDROCODONE-ACETAMINOPHEN 5-325 MG PO TABS
1.0000 | ORAL_TABLET | Freq: Once | ORAL | Status: AC
  Administered 2018-09-23: 1 via ORAL
  Filled 2018-09-22: qty 1

## 2018-09-22 MED ORDER — LIDOCAINE HCL (PF) 1 % IJ SOLN
20.0000 mL | Freq: Once | INTRAMUSCULAR | Status: DC
  Filled 2018-09-22: qty 20

## 2018-09-22 MED ORDER — INSULIN ASPART 100 UNIT/ML ~~LOC~~ SOLN
5.0000 [IU] | Freq: Once | SUBCUTANEOUS | Status: AC
  Administered 2018-09-22: 5 [IU] via SUBCUTANEOUS

## 2018-09-22 NOTE — ED Notes (Signed)
Called MRI to check wait time. Per MRI, 2 more pts ahead of him. Informed family. Denies other needs at this time

## 2018-09-22 NOTE — ED Triage Notes (Signed)
Pt here with c/o h/a and general not feeling well . Pt is diabetic , cbg was 247

## 2018-09-22 NOTE — ED Provider Notes (Signed)
[facility] EMERGENCY DEPARTMENT Provider Note   CSN: [id number] Arrival date & time: 09/22/18  1204     History   Chief Complaint Chief Complaint  Patient presents with  . Generalized Body Aches    HPI Andre Brown is a 38 y.o. male.  HPI  Patient is a 38 year old male with a history of type 1 diabetes mellitus, hypertension presenting for left-sided headache, generalized weakness, and feeling "sleepy".  Patient reports that he began having a headache yesterday, which was gradual in onset and on the left side of his head.  He reports that it was more severe this morning and sharp.  He reports that he feels so sleepy that "he can barely open his eyes".  Patient and his wife initially presented to urgent care today, however upon triage in urgent care, it was felt that he was too somnolent to be evaluated there, and triage note indicates that patient stated that he looked like he was "about to pass out".  Patient denies any history of regular headaches.  He denies any focal weakness or numbness, but does report feeling that he has "pins-and-needles" in all of his extremities.  He reports that he ambulated to the car, however has been in a wheelchair ever since.  Patient denies any drug use, reports that he took 1 shot of moonshine last night, from a batch that he has had previously.  He reports that he drinks approximately every couple weeks.  Patient reports that he can best describe the sensation he is feeling as hypoglycemia, however he was unable to assess his blood sugar when his symptoms first began.  He reports that he does not have his meter that continuously measures his blood glucose, and he does not have a point-of-care fingerstick glucometer.  He does report that upon feeling this way this morning, he ate several glucose tabs.  Patient reports vomiting once after his wife gave him food.  Patient denies any neck stiffness or pain.  Denies sore throat, congestion,  or rhinorrhea. Patient denies fever or chills, and is unsure if he was diaphoretic today.  He did receive Tylenol prior to arrival.  [place], and frequently [place] outside.  He does report that he was working on his car yesterday and had a couple of puncture wounds to his left hand.  Tetanus status unknown.  Past Medical History:  Diagnosis Date  . Diabetes mellitus without complication   . Hypertension   . Thyroid disease     There are no active problems to display for this patient.   No past surgical history on file.      Home Medications    Prior to Admission medications   Medication Sig Start Date End Date Taking? Authorizing Provider  ciprofloxacin (CIPRO) 500 MG tablet Take 1 tablet (500 mg total) by mouth 2 (two) times daily. 08/05/14   Shade Flood, MD  Insulin Aspart (NOVOLOG FLEXPEN [place]) Inject 12 Units into the skin 3 (three) times daily before meals.     [provider]  Insulin Glargine (BASAGLAR KWIKPEN) 100 UNIT/ML SOPN Inject 50 Units into the skin at bedtime.    [provider]  insulin glargine (LANTUS) 100 UNIT/ML injection Inject 90 Units into the skin at bedtime.    [provider]  levothyroxine (SYNTHROID, LEVOTHROID) 25 MCG tablet Take 25 mcg by mouth daily before breakfast.    [provider]  lisinopril (PRINIVIL,ZESTRIL) 10 MG  tablet Take 10 mg by mouth daily.    [provider]    Family History No family history on file.  Social History Social History   Tobacco Use  . Smoking status: Never Smoker  . Smokeless tobacco: Never Used  Substance Use Topics  . Alcohol use: Yes  . Drug use: No     Allergies   Patient has no known allergies.   Review of Systems Review of Systems  Constitutional: Positive for fatigue. Negative for chills and fever.  HENT: Negative for congestion and sore throat.   Eyes: Negative for visual disturbance.  Respiratory: Negative for  cough, chest tightness and shortness of breath.   Cardiovascular: Negative for chest pain.  Gastrointestinal: Positive for nausea. Negative for abdominal pain, constipation, diarrhea and vomiting.  Genitourinary: Negative for dysuria and flank pain.  Musculoskeletal: Negative for back pain and myalgias.  Skin: Positive for rash.  Neurological: Positive for weakness and headaches. Negative for dizziness, syncope and light-headedness.       +Generalized weakness.     Physical Exam Updated Vital Signs BP (!) 145/89   Pulse 81   Temp 98.3 F (36.8 C) (Oral)   Resp 14   SpO2 97%   Physical Exam Vitals signs and nursing note reviewed.  Constitutional:      General: He is not in acute distress.    Appearance: He is well-developed.  HENT:     Head: Normocephalic and atraumatic.     Comments: Oropharynx clear and moist. No tonsillar erythema.     Nose: Nose normal.  Eyes:     Conjunctiva/sclera: Conjunctivae normal.     Pupils: Pupils are equal, round, and reactive to light.  Neck:     Musculoskeletal: Normal range of motion and neck supple. No neck rigidity.     Comments: Negative Brudzinski sign.  Negative Kernig sign. Cardiovascular:     Rate and Rhythm: Normal rate and regular rhythm.     Heart sounds: S1 normal and S2 normal. No murmur.  Pulmonary:     Effort: Pulmonary effort is normal.     Breath sounds: Normal breath sounds. No wheezing or rales.  Abdominal:     General: There is no distension.     Palpations: Abdomen is soft.     Tenderness: There is no abdominal tenderness. There is no guarding.  Musculoskeletal: Normal range of motion.        General: No deformity.  Lymphadenopathy:     Cervical: No cervical adenopathy.  Skin:    General: Skin is warm and dry.     Findings: Rash present. No erythema.     Comments: Patient exhibits a fine, maculopapular eruption of the left shoulder and right anterior chest.  Appears migratory on reassessments.  Remainder of skin  of face, neck, trunk, and bilateral lower extremities assessed without any further rash identified.  Neurological:     Mental Status: He is alert.     Comments: Mental Status:  Alert, oriented, but appears slightly somnolent.  Speech fluent without evidence of aphasia. Able to follow 2 step commands without difficulty.  Cranial Nerves:  II:  Peripheral visual fields grossly normal, pupils equal, round, reactive to light III,IV, VI: ptosis not present, extra-ocular motions intact bilaterally  V,VII: smile symmetric, facial light touch sensation equal VIII: hearing grossly normal to voice  X: uvula elevates symmetrically  XI: bilateral shoulder shrug symmetric and strong XII: midline tongue extension without fassiculations Motor:  Normal tone. 5/5 in upper and  lower extremities bilaterally including strong and equal grip strength and dorsiflexion/plantar flexion  Deep Tendon Reflexes: 2+ and symmetric in the biceps and patella. No clonus. Cerebellar: normal finger-to-nose with bilateral upper extremities Gait: Gait deferred.  Stance: No pronator drift and good coordination, strength, and position sense with tapping of bilateral arms (performed in sitting position). CV: distal pulses palpable throughout    Psychiatric:        Behavior: Behavior normal.        Thought Content: Thought content normal.        Judgment: Judgment normal.        ED Treatments / Results  Labs (all labs ordered are listed, but only abnormal results are displayed) Labs Reviewed  COMPREHENSIVE METABOLIC PANEL - Abnormal; Notable for the following components:      Result Value   Sodium 133 (*)    Potassium 6.2 (*)    Glucose, Bld 349 (*)    Total Bilirubin 1.4 (*)    All other components within normal limits  URINALYSIS, ROUTINE W REFLEX MICROSCOPIC - Abnormal; Notable for the following components:   Glucose, UA >=500 (*)    Ketones, ur 20 (*)    Bacteria, UA RARE (*)    All other components within  normal limits  BASIC METABOLIC PANEL - Abnormal; Notable for the following components:   Sodium 133 (*)    Potassium 5.3 (*)    CO2 21 (*)    Glucose, Bld 381 (*)    All other components within normal limits  CBG MONITORING, ED - Abnormal; Notable for the following components:   Glucose-Capillary 254 (*)    All other components within normal limits  I-STAT VENOUS BLOOD GAS, ED - Abnormal; Notable for the following components:   pCO2, Ven 42.1 (*)    All other components within normal limits  CBG MONITORING, ED - Abnormal; Notable for the following components:   Glucose-Capillary 272 (*)    All other components within normal limits  CBG MONITORING, ED - Abnormal; Notable for the following components:   Glucose-Capillary 218 (*)    All other components within normal limits  CULTURE, BLOOD (ROUTINE X 2)  CULTURE, BLOOD (ROUTINE X 2)  CBC  INFLUENZA PANEL BY PCR (TYPE A & B)  ROCKY MTN SPOTTED FVR ABS PNL(IGG+IGM)  I-STAT CG4 LACTIC ACID, ED  I-STAT TROPONIN, ED  I-STAT CG4 LACTIC ACID, ED    EKG EKG Interpretation  Date/Time:  Wednesday September 22 2018 15:52:04 EST Ventricular Rate:  82 PR Interval:    QRS Duration: 77 QT Interval:  369 QTC Calculation: 431 R Axis:   54 Text Interpretation:  Sinus rhythm Nonspecific T wave abnormality No previous tracing Confirmed by Cathren Laine ([id number]) on 09/22/2018 4:06:46 PM   Radiology Dg Chest 2 View  Result Date: 09/22/2018 CLINICAL DATA:  Chills, shortness of breath, and headache. EXAM: CHEST - 2 VIEW COMPARISON:  None. FINDINGS: The cardiomediastinal silhouette is within normal limits for AP technique. The lungs are mildly hypoinflated. No airspace consolidation, edema, pleural effusion, pneumothorax is identified. No acute osseous abnormality is seen. IMPRESSION: No active cardiopulmonary disease. Electronically Signed   By: Sebastian Ache M.D.   On: 09/22/2018 14:35   Ct Head Wo Contrast  Result Date: 09/22/2018 CLINICAL DATA:   Headache. EXAM: CT HEAD WITHOUT CONTRAST TECHNIQUE: Contiguous axial images were obtained from the base of the skull through the vertex without intravenous contrast. COMPARISON:  None. FINDINGS: Brain: No evidence of acute infarction, hemorrhage, hydrocephalus,  extra-axial collection or mass lesion/mass effect. Vascular: No hyperdense vessel or unexpected calcification. Skull: Normal. Negative for fracture or focal lesion. Sinuses/Orbits: No acute finding. Other: None. IMPRESSION: Normal head CT. Electronically Signed   By: Lupita RaiderJames  Green Jr, M.D.   On: 09/22/2018 15:39    Procedures Procedures (including critical care time)  Medications Ordered in ED Medications  Tdap (BOOSTRIX) injection 0.5 mL (has no administration in time range)  sodium chloride 0.9 % bolus 1,000 mL (has no administration in time range)  metoCLOPramide (REGLAN) injection 10 mg (has no administration in time range)  diphenhydrAMINE (BENADRYL) injection 25 mg (has no administration in time range)     Initial Impression / Assessment and Plan / ED Course  I have reviewed the triage vital signs and the nursing notes.  Pertinent labs & imaging results that were available during my care of the patient were reviewed by me and considered in my medical decision making (see chart for details).  Clinical Course as of Sep 22 1812  Wed Sep 22, 2018  1506 Likely 2/2 hemolysis.   Potassium(!): 6.2 [AM]  1546 Reassessed patient at bedside. Patient reporting that he does not want lumbar puncture, due to the fact that he does not want a needle in the spine.  Risks and benefits were discussed with the patient as well as alternatives.  Stated that lumbar puncture was recommended to ultimately rule out subarachnoid hemorrhage or meningitis.   [AM]  1547 Unlcear etiology. No anion gap.   Potassium(!): 5.3 [AM]  1735 Spoke with Dr. Grace IsaacWatts, neuroradiologist.  He recommends MRI with and without contrast if evaluated for encephalitis.  Did state  that if patient has true meningitis, and it is early, and has no meningitis on exam, the MRI may not be sensitive for this. Appreciate this consult.    [AM]    Clinical Course User Index [AM] Elisha PonderMurray, Dan Scearce B, PA-C    Patient is overall nontoxic-appearing, hemodynamically stable, afebrile, however appears slightly somnolent, requires frequent arousals to perform exams and ask questions.  Slight hypertension but not elevated to the level expected with hypertensive encephalopathy. He exhibits no clear meningismus.  He has no focal neurologic deficits.  The sudden onset somnolence, however is very atypical for him per patient and his wife, as he usually does not seek medical care.  He may have had a hypoglycemic event that he was unable to identify due to not having a meter, however it is completely resolved on presentation emergency department without improvement in somnolence.  Differential diagnosis includes encephalitis, CVA, meningitis, ICH, complicated migraine.  Given the insidious onset of the headache, do have lower suspicion for ICH.  Unclear etiology of rash.  Does not appear to be meningococcal in nature.  Does appear to be migratory.  [facility], however rash is not classic, and this would be an atypical season to see this pathology. No known recent tick exposures.   Extensive work-up initiated on patient revealing no leukocytosis, slight hyperkalemia, but otherwise normal electrolytes with no anion gap.  Normal lactic acid.  Negative troponin.  Normal venous blood gas. CT head wo contrast unremarkable.   Recommended to patient lumbar puncture, however patient is declining due to concerns about having a needle in his back.  Risks, benefits, and alternatives were discussed with the patient as well as all questions regarding procedure addressed.  Lumbar puncture was discussed twice with patient by about this Clinical research associatewriter attending physician.  Patient continuing to  request alternatives.  Per discussion with attending physician, recommended MRI.  Consulted with radiology, recommends MRI of brain with and without contrast.  If patient has complete resolution of symptoms, normal neurologic exam on reassessment, normal MRI, may consider discharge, however patient is continuing to have any abnormal reassessments, or abnormalities on MRI, anticipate neurologic consultation.  This is a shared visit with Dr. Shaune Pollack. Patient was independently evaluated by this attending physician. Attending physician consulted in evaluation and management.  Care signed out to Langston Masker, PA-C at 6:14 PM.    Final Clinical Impressions(s) / ED Diagnoses   Final diagnoses:  Left-sided headache  Generalized weakness  Hyperglycemia    ED Discharge Orders    None       Delia Chimes 09/22/18 1817    Shaune Pollack, MD 09/23/18 Windell Moment

## 2018-09-22 NOTE — ED Provider Notes (Signed)
Pt's care turned over to me at 5:50 pm.  MRi pending.  Pt's mri returned and is normal.  Pt continues to refuse LP.  Pt reports he has drank the same moonshine in the same jar several times without problem. (Pt advised not to drink and counseled on dangers)   Pt is able to walk without dizziness or weakness,  He reports headache resolved but seems to be returning slightly.  Pt typing on cell phone sitting on the side of bed.    Dr. Denton Lank in to see and examine pt     Pt labs are normal  I doubt headache is related.  Pt advised to return if symptoms worse.  He should recheck tomorrow with his primary care.  Pt is given referrals to neurology if headaches persist.  Pt advised he has test pending.   Pts repeat cbg has improved.  He should monitor his glucose closely    [name] 09/22/18 2339    Cathren Laine, MD 09/23/18 [phone]

## 2018-09-22 NOTE — Discharge Instructions (Addendum)
See your Physician for recheck tomorrow.  Monitor your glucose. Schedule to see Neurology for evaluation if headaches persist.  Return if symptoms worsen or change.  You have test pending

## 2018-09-22 NOTE — ED Notes (Signed)
ED Provider at bedside. 

## 2018-09-22 NOTE — ED Notes (Signed)
Pt refusing LP

## 2018-09-22 NOTE — ED Triage Notes (Signed)
Pt here with wife, per wife, pt felt dizzy with headache today with high blood pressure of 140s, diabetic. Pt had sugar checked and it was 140 and 200s, pt is nodding off in the wheelchair stating he feels like he is going to pass out. Per Amy Pa, pt needs eval in ER. Family agreeable to plan, will drive him there.

## 2018-09-23 NOTE — ED Notes (Signed)
Patient Alert and oriented to baseline. Stable and ambulatory to baseline. Patient verbalized understanding of the discharge instructions.  Patient belongings were taken by the patient.   

## 2018-09-25 LAB — ROCKY MTN SPOTTED FVR ABS PNL(IGG+IGM)
RMSF IgG: NEGATIVE
RMSF IgM: 0.22 index (ref 0.00–0.89)

## 2018-09-27 LAB — CULTURE, BLOOD (ROUTINE X 2)
Culture: NO GROWTH
Culture: NO GROWTH
SPECIAL REQUESTS: ADEQUATE
Special Requests: ADEQUATE

## 2018-10-29 ENCOUNTER — Ambulatory Visit (HOSPITAL_COMMUNITY)
Admission: EM | Admit: 2018-10-29 | Discharge: 2018-10-29 | Disposition: A | Discharge status: Home / Self Care | Payer: BLUE CROSS/BLUE SHIELD | Attending: Internal Medicine | Admitting: Internal Medicine

## 2018-10-29 ENCOUNTER — Encounter (HOSPITAL_COMMUNITY): Payer: Self-pay | Admitting: Emergency Medicine

## 2018-10-29 ENCOUNTER — Ambulatory Visit (INDEPENDENT_AMBULATORY_CARE_PROVIDER_SITE_OTHER): Payer: BLUE CROSS/BLUE SHIELD

## 2018-10-29 DIAGNOSIS — R05 Cough: Secondary | ICD-10-CM | POA: Diagnosis not present

## 2018-10-29 DIAGNOSIS — R062 Wheezing: Secondary | ICD-10-CM

## 2018-10-29 DIAGNOSIS — R509 Fever, unspecified: Secondary | ICD-10-CM

## 2018-10-29 DIAGNOSIS — J209 Acute bronchitis, unspecified: Secondary | ICD-10-CM | POA: Diagnosis not present

## 2018-10-29 DIAGNOSIS — J029 Acute pharyngitis, unspecified: Secondary | ICD-10-CM | POA: Diagnosis not present

## 2018-10-29 DIAGNOSIS — R69 Illness, unspecified: Secondary | ICD-10-CM

## 2018-10-29 DIAGNOSIS — J111 Influenza due to unidentified influenza virus with other respiratory manifestations: Secondary | ICD-10-CM

## 2018-10-29 DIAGNOSIS — R5381 Other malaise: Secondary | ICD-10-CM

## 2018-10-29 MED ORDER — ALBUTEROL SULFATE HFA 108 (90 BASE) MCG/ACT IN AERS: 1.0000 | INHALATION_SPRAY | RESPIRATORY_TRACT | 0 refills | Status: DC | PRN

## 2018-10-29 MED ORDER — IPRATROPIUM-ALBUTEROL 0.5-2.5 (3) MG/3ML IN SOLN
3.0000 mL | Freq: Once | RESPIRATORY_TRACT | Status: AC
  Administered 2018-10-29: 3 mL via RESPIRATORY_TRACT

## 2018-10-29 MED ORDER — BENZONATATE 200 MG PO CAPS: 200.0000 mg | ORAL_CAPSULE | Freq: Three times a day (TID) | ORAL | 0 refills | Status: DC | PRN

## 2018-10-29 MED ORDER — AMOXICILLIN 875 MG PO TABS: 875.0000 mg | ORAL_TABLET | Freq: Two times a day (BID) | ORAL | 0 refills | Status: AC

## 2018-10-29 MED ORDER — IPRATROPIUM-ALBUTEROL 0.5-2.5 (3) MG/3ML IN SOLN
RESPIRATORY_TRACT | Status: AC
  Filled 2018-10-29: qty 3

## 2018-10-29 NOTE — ED Provider Notes (Signed)
[facility]    CSN: [id number] Arrival date & time: 10/29/18  0935     History   Chief Complaint Chief Complaint  Patient presents with  . Flu Like Symptoms    HPI Andre Brown is a 38 y.o. male.   He presents today with 4-day history of achiness, malaise, fatigue, tactile temperature.  Lots of productive cough, some nausea.  Head congestion, nasal discharge.  Some sore throat, mostly notices this with cough.  Also back is kind of achy.  No vomiting, no diarrhea.  Symptoms came on fairly abruptly about 4 days ago.  Daughter had strep throat last week.    HPI  Past Medical History:  Diagnosis Date  . Diabetes mellitus without complication (HCC)   . Hypertension   . Thyroid disease      History reviewed. No pertinent surgical history.     Home Medications    Prior to Admission medications   Medication Sig Start Date End Date Taking? Authorizing Provider  albuterol (PROVENTIL HFA;VENTOLIN HFA) 108 (90 Base) MCG/ACT inhaler Inhale 1-2 puffs into the lungs every 4 (four) hours as needed for wheezing or shortness of breath. 10/29/18   Isa RankinMurray, Tangala Wiegert Wilson, MD  amoxicillin (AMOXIL) 875 MG tablet Take 1 tablet (875 mg total) by mouth 2 (two) times daily for 10 days. 10/29/18 11/08/18  Isa RankinMurray, Goran Olden Wilson, MD  benzonatate (TESSALON) 200 MG capsule Take 1 capsule (200 mg total) by mouth 3 (three) times daily as needed for cough. 10/29/18   Isa RankinMurray, Dominik Yordy Wilson, MD  Insulin Aspart (NOVOLOG FLEXPEN [place]) Inject 12 Units into the skin 3 (three) times daily before meals.     [provider]  Insulin Degludec (TRESIBA FLEXTOUCH [place]) Inject 50 Units into the skin every morning.    [provider]  levothyroxine (SYNTHROID, LEVOTHROID) 100 MCG tablet Take 100 mcg by mouth daily before breakfast.     [provider]    Family History No family history on file.  Social History Social History   Tobacco Use  . Smoking status: Never Smoker  .  Smokeless tobacco: Never Used  Substance Use Topics  . Alcohol use: Yes  . Drug use: No     Allergies   Patient has no known allergies.   Review of Systems Review of Systems  All other systems reviewed and are negative.    Physical Exam Triage Vital Signs ED Triage Vitals  Enc Vitals Group     BP 10/29/18 1015 127/72     Pulse Rate 10/29/18 1015 71     Resp 10/29/18 1015 18     Temp 10/29/18 1015 98.4 F (36.9 C)     Temp src --      SpO2 10/29/18 1015 100 %     Weight --      Height --      Pain Score 10/29/18 1016 0     Pain Loc --    Updated Vital Signs BP 127/72   Pulse 71   Temp 98.4 F (36.9 C)   Resp 18   SpO2 100%  Physical Exam Vitals signs and nursing note reviewed.  Constitutional:      Comments: Alert, nicely groomed Looks ill but not toxic Very frequent coughing during exam  HENT:     Head: Atraumatic.     Comments: Bilateral TMs are quite dull, red-tinged bilaterally, right greater than left Marked nasal congestion bilaterally with mucopurulent material present Throat is pink with noninflamed tonsils, some  postnasal drainage evident Eyes:     Comments: Conjugate gaze, no eye redness/drainage  Neck:     Musculoskeletal: Neck supple.  Cardiovascular:     Rate and Rhythm: Normal rate and regular rhythm.  Pulmonary:     Effort: No respiratory distress.     Breath sounds: No wheezing or rales.     Comments: Coarse but symmetric breath sounds throughout Patient reports that wife thought the breath sounds were little louder on one side than the other Transient wheeze in the right lower posterior base, clear after cough Abdominal:     General: There is no distension.  Musculoskeletal: Normal range of motion.  Skin:    General: Skin is warm and dry.     Comments: No cyanosis  Neurological:     Mental Status: He is alert and oriented to person, place, and time.      UC Treatments / Results   Radiology Dg Chest 2 View  Result Date:  10/29/2018 CLINICAL DATA:  Cough, congestion, and fever for 1 week. EXAM: CHEST - 2 VIEW COMPARISON:  09/22/2018 FINDINGS: The cardiomediastinal silhouette is unchanged and within normal limits. The lungs are slightly hypoinflated with mild peribronchial thickening. No confluent airspace opacity, edema, pleural effusion, pneumothorax is identified. No acute osseous abnormality is seen. IMPRESSION: Mild peribronchial thickening which may reflect bronchitis. Electronically Signed   By: Sebastian Ache M.D.   On: 10/29/2018 11:18    Procedures Procedures (including critical care time)  Medications Ordered in UC Medications  ipratropium-albuterol (DUONEB) 0.5-2.5 (3) MG/3ML nebulizer solution 3 mL (3 mLs Nebulization Given 10/29/18 1112)    Final Clinical Impressions(s) / UC Diagnoses   Final diagnoses:  Influenza-like illness  Bronchitis with bronchospasm     Discharge Instructions     Anticipate gradual improvement in cough, fevers, well being, over the next several days.  Cough may take a couple weeks to subside.  Chest xray did not show any pneumonia today, but did suggest mild bronchitis (which is often part of a viral process or may be related to history of asthma).  Breathing treatment was given at the urgent care today with improvement in cough.  Prescriptions for albuterol inhaler and benzonatate (for cough) were printed, along with prescription for amoxicillin (antibiotic) for early signs of sinus/ear infection.  Recheck for persistent (>3 more days) ever >100.5, increasing phlegm production/nasal discharge, or if not starting to improve in a few days.       ED Prescriptions    Medication Sig Dispense Auth. Provider   amoxicillin (AMOXIL) 875 MG tablet Take 1 tablet (875 mg total) by mouth 2 (two) times daily for 10 days. 20 tablet Isa Rankin, MD   albuterol (PROVENTIL HFA;VENTOLIN HFA) 108 (90 Base) MCG/ACT inhaler Inhale 1-2 puffs into the lungs every 4 (four) hours as needed  for wheezing or shortness of breath. 1 Inhaler Isa Rankin, MD   benzonatate (TESSALON) 200 MG capsule Take 1 capsule (200 mg total) by mouth 3 (three) times daily as needed for cough. 30 capsule Isa Rankin, MD     Windell Norfolk, MD 10/30/18 [phone]

## 2018-10-29 NOTE — ED Triage Notes (Signed)
Pt c/o flu like symptoms, body aches, cough, started on Monday. Fever and chills.

## 2018-10-29 NOTE — Discharge Instructions (Addendum)
Anticipate gradual improvement in cough, fevers, well being, over the next several days.  Cough may take a couple weeks to subside.  Chest xray did not show any pneumonia today, but did suggest mild bronchitis (which is often part of a viral process or may be related to history of asthma).  Breathing treatment was given at the urgent care today with improvement in cough.  Prescriptions for albuterol inhaler and benzonatate (for cough) were printed, along with prescription for amoxicillin (antibiotic) for early signs of sinus/ear infection.  Recheck for persistent (>3 more days) ever >100.5, increasing phlegm production/nasal discharge, or if not starting to improve in a few days.

## 2018-11-11 DIAGNOSIS — Z794 Long term (current) use of insulin: Secondary | ICD-10-CM | POA: Diagnosis not present

## 2018-11-11 DIAGNOSIS — E109 Type 1 diabetes mellitus without complications: Secondary | ICD-10-CM | POA: Diagnosis not present

## 2018-11-11 DIAGNOSIS — E039 Hypothyroidism, unspecified: Secondary | ICD-10-CM | POA: Diagnosis not present

## 2018-11-11 DIAGNOSIS — E663 Overweight: Secondary | ICD-10-CM | POA: Diagnosis not present

## 2018-11-13 DIAGNOSIS — N4889 Other specified disorders of penis: Secondary | ICD-10-CM | POA: Diagnosis not present

## 2018-11-13 DIAGNOSIS — Z7251 High risk heterosexual behavior: Secondary | ICD-10-CM | POA: Diagnosis not present

## 2018-11-15 DIAGNOSIS — E7800 Pure hypercholesterolemia, unspecified: Secondary | ICD-10-CM | POA: Diagnosis not present

## 2018-11-15 DIAGNOSIS — E109 Type 1 diabetes mellitus without complications: Secondary | ICD-10-CM | POA: Diagnosis not present

## 2018-11-15 DIAGNOSIS — E039 Hypothyroidism, unspecified: Secondary | ICD-10-CM | POA: Diagnosis not present

## 2018-11-15 DIAGNOSIS — Z0000 Encounter for general adult medical examination without abnormal findings: Secondary | ICD-10-CM | POA: Diagnosis not present

## 2018-11-19 DIAGNOSIS — Z136 Encounter for screening for cardiovascular disorders: Secondary | ICD-10-CM | POA: Diagnosis not present

## 2018-11-19 DIAGNOSIS — Z713 Dietary counseling and surveillance: Secondary | ICD-10-CM | POA: Diagnosis not present

## 2018-11-19 DIAGNOSIS — Z6834 Body mass index (BMI) 34.0-34.9, adult: Secondary | ICD-10-CM | POA: Diagnosis not present

## 2018-11-19 DIAGNOSIS — Z13220 Encounter for screening for lipoid disorders: Secondary | ICD-10-CM | POA: Diagnosis not present

## 2018-12-22 ENCOUNTER — Ambulatory Visit (INDEPENDENT_AMBULATORY_CARE_PROVIDER_SITE_OTHER): Payer: BLUE CROSS/BLUE SHIELD | Admitting: Endocrinology

## 2018-12-22 ENCOUNTER — Encounter: Payer: Self-pay | Admitting: Endocrinology

## 2018-12-22 ENCOUNTER — Other Ambulatory Visit: Payer: Self-pay

## 2018-12-22 ENCOUNTER — Encounter: Payer: BLUE CROSS/BLUE SHIELD | Attending: Endocrinology | Admitting: Nutrition

## 2018-12-22 DIAGNOSIS — Z794 Long term (current) use of insulin: Secondary | ICD-10-CM | POA: Insufficient documentation

## 2018-12-22 DIAGNOSIS — E119 Type 2 diabetes mellitus without complications: Secondary | ICD-10-CM | POA: Insufficient documentation

## 2018-12-22 DIAGNOSIS — E10649 Type 1 diabetes mellitus with hypoglycemia without coma: Secondary | ICD-10-CM

## 2018-12-22 DIAGNOSIS — Z8739 Personal history of other diseases of the musculoskeletal system and connective tissue: Secondary | ICD-10-CM

## 2018-12-22 DIAGNOSIS — E109 Type 1 diabetes mellitus without complications: Secondary | ICD-10-CM

## 2018-12-22 MED ORDER — INSULIN DEGLUDEC 100 UNIT/ML ~~LOC~~ SOPN: 80.0000 [IU] | PEN_INJECTOR | Freq: Every day | SUBCUTANEOUS | 3 refills | Status: DC

## 2018-12-22 MED ORDER — INSULIN ASPART 100 UNIT/ML FLEXPEN: 12.0000 [IU] | PEN_INJECTOR | Freq: Three times a day (TID) | SUBCUTANEOUS | 3 refills | Status: DC

## 2018-12-22 MED ORDER — GLUCAGON (RDNA) 1 MG IJ KIT: 1.0000 mg | PACK | Freq: Once | INTRAMUSCULAR | 12 refills | Status: DC | PRN

## 2018-12-22 NOTE — Patient Instructions (Signed)
Go on line and look at each pump model.   Fill out paperwork and fax, or bring to office once you decide which pump you want.  Call when pump comes in to set up an appointment for training

## 2018-12-22 NOTE — Progress Notes (Signed)
Subjective:    Patient ID: Andre Brown, male    DOB: July 05, 1981, 38 y.o.   MRN: [id number]  HPI pt is referred by Dr Nehemiah Settle, for diabetes.  Pt states DM was dx'ed in 1999; he has mild if any neuropathy of the lower extremities; he is unaware of any associated chronic complications; he has been on insulin since 2002; pt says his diet and exercise are fair; he had pancreatitis once, in 2016. he has never had pancreatic surgery.  Last episode of severe hypoglycemia was 2017.  He had DKA once (2002).  He takes tresiba 80-90/day, and novolog 12-25 units 3 times a day (just before each meal).  He has a dexcom.  He awakens with severe muscle stiffness.   Past Medical History:  Diagnosis Date  . Diabetes mellitus without complication (HCC)   . Hypertension   . Thyroid disease     No past surgical history on file.  Social History   Socioeconomic History  . Marital status: Married    Spouse name: Not on file  . Number of children: Not on file  . Years of education: Not on file  . Highest education level: Not on file  Occupational History  . Not on file  Social Needs  . Financial resource strain: Not on file  . Food insecurity:    Worry: Not on file    Inability: Not on file  . Transportation needs:    Medical: Not on file    Non-medical: Not on file  Tobacco Use  . Smoking status: Never Smoker  . Smokeless tobacco: Never Used  Substance and Sexual Activity  . Alcohol use: Yes  . Drug use: No  . Sexual activity: Not on file  Lifestyle  . Physical activity:    Days per week: Not on file    Minutes per session: Not on file  . Stress: Not on file  Relationships  . Social connections:    Talks on phone: Not on file    Gets together: Not on file    Attends religious service: Not on file    Active member of club or organization: Not on file    Attends meetings of clubs or organizations: Not on file    Relationship status: Not on file  . Intimate partner violence:    Fear  of current or ex partner: Not on file    Emotionally abused: Not on file    Physically abused: Not on file    Forced sexual activity: Not on file  Other Topics Concern  . Not on file  Social History Narrative  . Not on file    Current Outpatient Medications on File Prior to Visit  Medication Sig Dispense Refill  . levothyroxine (SYNTHROID, LEVOTHROID) 100 MCG tablet Take 175 mcg by mouth daily before breakfast.     . albuterol (PROVENTIL HFA;VENTOLIN HFA) 108 (90 Base) MCG/ACT inhaler Inhale 1-2 puffs into the lungs every 4 (four) hours as needed for wheezing or shortness of breath. (Patient not taking: Reported on 12/22/2018) 1 Inhaler 0  . benzonatate (TESSALON) 200 MG capsule Take 1 capsule (200 mg total) by mouth 3 (three) times daily as needed for cough. (Patient not taking: Reported on 12/22/2018) 30 capsule 0   No current facility-administered medications on file prior to visit.     No Known Allergies  Family History  Problem Relation Age of Onset  . Diabetes Neg Hx     BP (!) 142/92 (BP Location: Right  Arm, Patient Position: Sitting, Cuff Size: Normal)   Pulse 68   Temp 98.1 F (36.7 C)   Ht 5\' 9"  (1.753 m)   Wt 235 lb 9.6 oz (106.9 kg)   SpO2 97%   BMI 34.79 kg/m     Review of Systems denies weight loss, blurry vision, headache, chest pain, sob, n/v, urinary frequency, muscle cramps, excessive diaphoresis, memory loss, depression, cold intolerance, rhinorrhea, and easy bruising.      Objective:   Physical Exam VS: see vs page GEN: no distress HEAD: head: no deformity eyes: no periorbital swelling, no proptosis external nose and ears are normal mouth: no lesion seen NECK: supple, thyroid is not enlarged.  CHEST WALL: no deformity LUNGS: clear to auscultation CV: reg rate and rhythm, no murmur ABD: abdomen is soft, nontender.  no hepatosplenomegaly.  not distended.  no hernia MUSCULOSKELETAL: muscle bulk and strength are grossly normal.  no obvious joint  swelling.  gait is normal and steady EXTEMITIES: no deformity.  no ulcer on the feet.  feet are of normal color and temp.  Trace bilat leg edema PULSES: dorsalis pedis intact bilat.  no carotid bruit NEURO:  cn 2-12 grossly intact.   readily moves all 4's.  sensation is intact to touch on the feet SKIN:  Normal texture and temperature.  No rash or suspicious lesion is visible.   NODES:  None palpable at the neck PSYCH: alert, well-oriented.  Does not appear anxious nor depressed.  outside test results are reviewed: A1c=8.4%  I have reviewed outside records, and summarized: Pt was noted to have elevated a1c, and referred here.  He has been seen several times in ER over the past few months, but not specifically for DM  I personally reviewed electrocardiogram tracing (09/22/18): Indication: toxin ingestion.  Impression: NSR.  No MI.  NS-TWA No comparison is available      Assessment & Plan:  Muscle stiffness, new: he should r/o stiff-person syndrome.  Type 1 DM: he needs increased rx.  Hypoglycemia: severe exacerbation.  We discussed pump rx.  He agrees to pursue.  Pancreatitis: This limits rx options.    Patient Instructions  Your blood pressure is high today.  Please see your primary care provider soon, to have it rechecked good diet and exercise significantly improve the control of your diabetes.  please let me know if you wish to be referred to a dietician.  high blood sugar is very risky to your health.  you should see an eye doctor and dentist every year.  It is very important to get all recommended vaccinations.  Controlling your blood pressure and cholesterol drastically reduces the damage diabetes does to your body.  Those who smoke should quit.  Please discuss these with your doctor.   Please see a rheumatology specialist.  you will receive a phone call, about a day and time for an appointment.   Please continue the same insulins for now Please see linda, to consider an insulin  pump. Please come back for a follow-up appointment in 2 months.

## 2018-12-22 NOTE — Progress Notes (Signed)
Andre Brown was shown the 3 different insulin pumps.  We discussed how they work, and the advantages and disadvantages of pump therapy as well as each individual pump. He was given brochures on each pump, and he was encouraged to go on line to investigate them further.  He was told that once he decides on which model he wants, he will need to fill out paperwork and fax to the company.  He agreed to do this and had no final questions He is wearing a Dexcom sensor, and was shown how he can get it sent to his phone.  He downloaded the app for this and said he will set up the account later for this.  He had no final questions

## 2018-12-22 NOTE — Patient Instructions (Addendum)
Your blood pressure is high today.  Please see your primary care provider soon, to have it rechecked good diet and exercise significantly improve the control of your diabetes.  please let me know if you wish to be referred to a dietician.  high blood sugar is very risky to your health.  you should see an eye doctor and dentist every year.  It is very important to get all recommended vaccinations.  Controlling your blood pressure and cholesterol drastically reduces the damage diabetes does to your body.  Those who smoke should quit.  Please discuss these with your doctor.   Please see a rheumatology specialist.  you will receive a phone call, about a day and time for an appointment.   Please continue the same insulins for now Please see Andre Brown, to consider an insulin pump. Please come back for a follow-up appointment in 2 months.

## 2019-02-17 ENCOUNTER — Other Ambulatory Visit: Payer: Self-pay

## 2019-02-21 ENCOUNTER — Other Ambulatory Visit: Payer: Self-pay

## 2019-02-21 ENCOUNTER — Ambulatory Visit (INDEPENDENT_AMBULATORY_CARE_PROVIDER_SITE_OTHER): Payer: BLUE CROSS/BLUE SHIELD | Admitting: Endocrinology

## 2019-02-21 ENCOUNTER — Encounter: Payer: Self-pay | Admitting: Endocrinology

## 2019-02-21 VITALS — BP 130/78 | HR 75 | Temp 97.7°F | Wt 231.0 lb

## 2019-02-21 DIAGNOSIS — E10649 Type 1 diabetes mellitus with hypoglycemia without coma: Secondary | ICD-10-CM | POA: Diagnosis not present

## 2019-02-21 DIAGNOSIS — I10 Essential (primary) hypertension: Secondary | ICD-10-CM

## 2019-02-21 DIAGNOSIS — E109 Type 1 diabetes mellitus without complications: Secondary | ICD-10-CM

## 2019-02-21 LAB — POCT GLYCOSYLATED HEMOGLOBIN (HGB A1C): Hemoglobin A1C: 7 % — AB (ref 4.0–5.6)

## 2019-02-21 MED ORDER — INSULIN DEGLUDEC 100 UNIT/ML ~~LOC~~ SOPN: 45.0000 [IU] | PEN_INJECTOR | Freq: Every day | SUBCUTANEOUS | 3 refills | Status: DC

## 2019-02-21 NOTE — Patient Instructions (Addendum)
Your blood pressure is high today.  Please see your primary care provider soon, to have it rechecked.   Please decrease the tresiba to 45 units daily, and:  Take 5 more units with supper.  Please come back for a follow-up appointment in 2 months.

## 2019-02-21 NOTE — Progress Notes (Signed)
Subjective:    Patient ID: Andre Brown, male    DOB: September 03, 1981, 38 y.o.   MRN: [id number]  HPI Pt returns for f/u of diabetes mellitus: DM type: 1 Dx'ed: 1999 Complications: none Therapy: insulin since 2002 DKA: once (2002) Severe hypoglycemia: last episode was 2017 Pancreatitis: once (2016) Pancreatic imaging: never.   Other: he has dexcom continuous glucose monitor.  He is considering pump rx.   Interval history:  pt states he feels well in general.  I reviewed continuous glucose monitor data.  Glucose varies from 68-345.  It is in general highest at HS.  He takes tresiba 50 units qd, and novolog 20-25 units 3 times a day (just before each meal).   Past Medical History:  Diagnosis Date  . Diabetes mellitus without complication (HCC)   . Hypertension   . Thyroid disease     No past surgical history on file.  Social History   Socioeconomic History  . Marital status: Married    Spouse name: Not on file  . Number of children: Not on file  . Years of education: Not on file  . Highest education level: Not on file  Occupational History  . Not on file  Social Needs  . Financial resource strain: Not on file  . Food insecurity:    Worry: Not on file    Inability: Not on file  . Transportation needs:    Medical: Not on file    Non-medical: Not on file  Tobacco Use  . Smoking status: Never Smoker  . Smokeless tobacco: Never Used  Substance and Sexual Activity  . Alcohol use: Yes  . Drug use: No  . Sexual activity: Not on file  Lifestyle  . Physical activity:    Days per week: Not on file    Minutes per session: Not on file  . Stress: Not on file  Relationships  . Social connections:    Talks on phone: Not on file    Gets together: Not on file    Attends religious service: Not on file    Active member of club or organization: Not on file    Attends meetings of clubs or organizations: Not on file    Relationship status: Not on file  . Intimate partner  violence:    Fear of current or ex partner: Not on file    Emotionally abused: Not on file    Physically abused: Not on file    Forced sexual activity: Not on file  Other Topics Concern  . Not on file  Social History Narrative  . Not on file    Current Outpatient Medications on File Prior to Visit  Medication Sig Dispense Refill  . albuterol (PROVENTIL HFA;VENTOLIN HFA) 108 (90 Base) MCG/ACT inhaler Inhale 1-2 puffs into the lungs every 4 (four) hours as needed for wheezing or shortness of breath. (Patient not taking: Reported on 12/22/2018) 1 Inhaler 0  . benzonatate (TESSALON) 200 MG capsule Take 1 capsule (200 mg total) by mouth 3 (three) times daily as needed for cough. (Patient not taking: Reported on 12/22/2018) 30 capsule 0  . glucagon (GLUCAGON EMERGENCY) 1 MG injection Inject 1 mg into the muscle once as needed for up to 1 dose. 1 each 12  . insulin aspart (NOVOLOG FLEXPEN) 100 UNIT/ML FlexPen Inject 12-25 Units into the skin 3 (three) times daily with meals. 20 pen 3  . levothyroxine (SYNTHROID, LEVOTHROID) 100 MCG tablet Take 175 mcg by mouth daily before breakfast.  No current facility-administered medications on file prior to visit.     No Known Allergies  Family History  Problem Relation Age of Onset  . Diabetes Neg Hx     BP 130/78 (BP Location: Left Arm, Patient Position: Sitting, Cuff Size: Normal)   Pulse 75   Temp 97.7 F (36.5 C) (Oral)   Wt 231 lb (104.8 kg)   SpO2 99%   BMI 34.11 kg/m    Review of Systems He denies LOC    Objective:   Physical Exam VITAL SIGNS:  See vs page GENERAL: no distress Pulses: dorsalis pedis intact bilat.   MSK: no deformity of the feet CV: no leg edema Skin:  no ulcer on the feet.  normal color and temp on the feet. Neuro: sensation is intact to touch on the feet   Lab Results  Component Value Date   HGBA1C 7.0 (A) 02/21/2019       Assessment & Plan:  HTN: is noted today Type 1 DM: The pattern of his cbg's  indicates he needs some adjustment in his therapy. Hypoglycemia: mild, but this limits rx options   Patient Instructions  Your blood pressure is high today.  Please see your primary care provider soon, to have it rechecked.   Please decrease the tresiba to 45 units daily, and:  Take 5 more units with supper.  Please come back for a follow-up appointment in 2 months.

## 2019-03-05 ENCOUNTER — Other Ambulatory Visit: Payer: Self-pay | Admitting: Endocrinology

## 2019-04-05 ENCOUNTER — Other Ambulatory Visit: Payer: Self-pay | Admitting: Endocrinology

## 2019-04-19 DIAGNOSIS — Z794 Long term (current) use of insulin: Secondary | ICD-10-CM | POA: Diagnosis not present

## 2019-04-19 DIAGNOSIS — E109 Type 1 diabetes mellitus without complications: Secondary | ICD-10-CM | POA: Diagnosis not present

## 2019-04-26 ENCOUNTER — Telehealth: Payer: Self-pay | Admitting: Nutrition

## 2019-04-26 NOTE — Telephone Encounter (Signed)
Patient left message on my phone to schedule his pump start.  I called him back and told him I can not do this until Dr. Loanne Drilling returns from vacation after 8/17.  Gave him times I can do this on that date.  Telephone number given to call me back

## 2019-05-06 ENCOUNTER — Other Ambulatory Visit: Payer: Self-pay

## 2019-05-09 ENCOUNTER — Telehealth: Payer: Self-pay | Admitting: Endocrinology

## 2019-05-09 ENCOUNTER — Other Ambulatory Visit: Payer: Self-pay

## 2019-05-09 ENCOUNTER — Encounter: Payer: BLUE CROSS/BLUE SHIELD | Admitting: Nutrition

## 2019-05-09 MED ORDER — INSULIN ASPART 100 UNIT/ML ~~LOC~~ SOLN: SUBCUTANEOUS | 2 refills | Status: DC

## 2019-05-09 MED ORDER — INSULIN ASPART 100 UNIT/ML ~~LOC~~ SOLN: SUBCUTANEOUS | 11 refills | Status: DC

## 2019-05-09 NOTE — Telephone Encounter (Signed)
-----   Message from Ocie Doyne, RN sent at 05/09/2019  8:15 AM EDT ----- Please order Novolog in a vial for him.  He will be on approx. 140u/day.  I will be starting him remotely tomorrow.

## 2019-05-09 NOTE — Telephone Encounter (Signed)
OK, I have sent a prescription to your pharmacy.  

## 2019-05-10 ENCOUNTER — Ambulatory Visit: Payer: BLUE CROSS/BLUE SHIELD | Admitting: Endocrinology

## 2019-05-10 ENCOUNTER — Other Ambulatory Visit: Payer: Self-pay

## 2019-05-10 ENCOUNTER — Encounter: Payer: BC Managed Care – PPO | Attending: Endocrinology | Admitting: Nutrition

## 2019-05-10 DIAGNOSIS — E1165 Type 2 diabetes mellitus with hyperglycemia: Secondary | ICD-10-CM

## 2019-05-10 DIAGNOSIS — E119 Type 2 diabetes mellitus without complications: Secondary | ICD-10-CM | POA: Insufficient documentation

## 2019-05-10 DIAGNOSIS — Z794 Long term (current) use of insulin: Secondary | ICD-10-CM | POA: Insufficient documentation

## 2019-05-11 NOTE — Patient Instructions (Addendum)
Pick up instructions sheets at front desk Make an appointment with Dr. Loanne Drilling for Thursday or Friday. Call [employer] help line if questions. Call office if Blood sugars drop below 70 or remain above 250.

## 2019-05-11 NOTE — Progress Notes (Signed)
We discussed the disadvantages of telemedicne, but he chose to do this as an on line training.  We discussed how this pump delivers insulin, and he reported good understanding of this. He was instructed on how to put in the settings per Dr. Cordelia Pen orders.  Basal rate: 1.4, I/C 1.5, ISF: 50, target 110.  Timing 5 hours.  He was instructed on how to give a bolus, and he redemonstrated this appropriately X2,  His blood sugar was 186 fasting and he did a correction dose at this time (9AM).   He is currently wearing a Dexcom G6 with readings going to his phone and his reader.  He was told to stop the reader and we linked his Dexcom to his [employer] pump.  Warm up period will take 2 hours. I will call him at noon to see how he is doing with all of this.   He reports knowing how to count carbs and we reviewed this a little.  He had no questions about this.    12PM:  Patient reported no difficulty giving breakfast bolus, but reports that readings were not still going to his pump.  He had forgotten to DC his reader, and he did this at this time.  He was told to call [employer]'s help desk if the readings did not link to his pump.  He agreed to do this.   He did not want a phone call this evening, and said he would call me if he had questions.   He was told to call the office and make an appointment with Dr. Loanne Drilling on Thursday or Friday of this week.  He agreed to do this, and to come back and see me on Monday, for the rest of the training.   He was told to call the office today before 5PM if blood sugar readings dropped or remained over 250.  He agreed to do this as well.   He filled  A cartridge with Novolog insulin and attached the infusion set to his left upper abdomen with some assistance from his wife,(who is a nurse).  The infusion set was attached to the needle,and he was shown how to attach and release this connection for when he takes a shower.  He did this without any difficulty. He was told that I left  instruction sheets up front for him to pick up on how to "give a bolus", how to "change pump settings", and "how to put the pump in start/stop"., and " how to change the cartridge" I will call him tomorrow to make sure he is doing well, without questions and that he ahs made his appointment with Dr. Loanne Drilling.

## 2019-05-13 ENCOUNTER — Ambulatory Visit (INDEPENDENT_AMBULATORY_CARE_PROVIDER_SITE_OTHER): Payer: BC Managed Care – PPO | Admitting: Endocrinology

## 2019-05-13 ENCOUNTER — Encounter: Payer: Self-pay | Admitting: Endocrinology

## 2019-05-13 ENCOUNTER — Other Ambulatory Visit: Payer: Self-pay

## 2019-05-13 VITALS — BP 118/60 | HR 75 | Ht 69.0 in | Wt 238.6 lb

## 2019-05-13 DIAGNOSIS — E109 Type 1 diabetes mellitus without complications: Secondary | ICD-10-CM | POA: Diagnosis not present

## 2019-05-13 LAB — POCT GLYCOSYLATED HEMOGLOBIN (HGB A1C): Hemoglobin A1C: 7.2 % — AB (ref 4.0–5.6)

## 2019-05-13 NOTE — Progress Notes (Signed)
Subjective:    Patient ID: Andre Brown, male    DOB: 04/25/1981, 38 y.o.   MRN: [id number]  HPI Pt returns for f/u of diabetes mellitus: DM type: 1 Dx'ed: 8938 Complications: none Therapy: insulin since 2002, and pump rx since 2020 DKA: once (2002) Severe hypoglycemia: last episode was 2017 Pancreatitis: once (2016) Pancreatic imaging: never.   Other: he has dexcom continuous glucose monitor.  He is considering pump rx.   Interval history: he takes these settings: basal rate of 1.4 units/hr, 124 hrs per day bolus of 1 unit/1.5 grams carbohydrate correction bolus (which some people call "sensitivity," or "insulin sensitivity ratio," or just "isr") of 1 unit for each 50 by which your glucose exceeds 100  I reviewed continuous glucose monitor data.  Glucose varies from 40-350.  It is in general higher PC than AC. Pt says meal bolus is approx 40 units/day.  However, download says TDD is only 13 units/day.  Past Medical History:  Diagnosis Date  . Diabetes mellitus without complication ([place])   . Hypertension   . Thyroid disease     No past surgical history on file.  Social History   Socioeconomic History  . Marital status: Married    Spouse name: Not on file  . Number of children: Not on file  . Years of education: Not on file  . Highest education level: Not on file  Occupational History  . Not on file  Social Needs  . Financial resource strain: Not on file  . Food insecurity    Worry: Not on file    Inability: Not on file  . Transportation needs    Medical: Not on file    Non-medical: Not on file  Tobacco Use  . Smoking status: Never Smoker  . Smokeless tobacco: Never Used  Substance and Sexual Activity  . Alcohol use: Yes  . Drug use: No  . Sexual activity: Not on file  Lifestyle  . Physical activity    Days per week: Not on file    Minutes per session: Not on file  . Stress: Not on file  Relationships  . Social Herbalist on phone: Not on  file    Gets together: Not on file    Attends religious service: Not on file    Active member of club or organization: Not on file    Attends meetings of clubs or organizations: Not on file    Relationship status: Not on file  . Intimate partner violence    Fear of current or ex partner: Not on file    Emotionally abused: Not on file    Physically abused: Not on file    Forced sexual activity: Not on file  Other Topics Concern  . Not on file  Social History Narrative  . Not on file    Current Outpatient Medications on File Prior to Visit  Medication Sig Dispense Refill  . albuterol (PROVENTIL HFA;VENTOLIN HFA) 108 (90 Base) MCG/ACT inhaler Inhale 1-2 puffs into the lungs every 4 (four) hours as needed for wheezing or shortness of breath. 1 Inhaler 0  . glucagon (GLUCAGON EMERGENCY) 1 MG injection Inject 1 mg into the muscle once as needed for up to 1 dose. 1 each 12  . insulin aspart (NOVOLOG) 100 UNIT/ML injection For use in pump, total of 140 units per day 126 mL 2  . levothyroxine (SYNTHROID, LEVOTHROID) 100 MCG tablet Take 175 mcg by mouth daily before breakfast.  No current facility-administered medications on file prior to visit.     No Known Allergies  Family History  Problem Relation Age of Onset  . Diabetes Neg Hx     BP 118/60 (BP Location: Left Arm, Patient Position: Sitting, Cuff Size: Large)   Pulse 75   Ht 5\' 9"  (1.753 m)   Wt 238 lb 9.6 oz (108.2 kg)   SpO2 96%   BMI 35.24 kg/m    Review of Systems Denies LOC    Objective:   Physical Exam VITAL SIGNS:  See vs page GENERAL: no distress Pulses: dorsalis pedis intact bilat.   MSK: no deformity of the feet CV: trace bilat leg edema Skin:  no ulcer on the feet.  normal color and temp on the feet. Neuro: sensation is intact to touch on the feet       Assessment & Plan:  Type 1 DM: The pattern of his cbg's indicates he needs some adjustment in his therapy.   Device malfunction: We discussed.   In view of this, he declines to make adjustments today Hypoglycemia: this limits aggressiveness of glycemic control   Patient Instructions  Please take these pump settings: basal rate of 1.4 units/hr, 24 HRS per day bolus of 1 unit/1.5 grams carbohydrate.   correction bolus (which some people call "sensitivity," or "insulin sensitivity ratio," or just "isr") of 1 unit for each 50 by which your glucose exceeds 100.  Please see Bonita QuinLinda and I both next week.

## 2019-05-13 NOTE — Patient Instructions (Addendum)
Please take these pump settings: basal rate of 1.4 units/hr, 24 HRS per day bolus of 1 unit/1.5 grams carbohydrate.   correction bolus (which some people call "sensitivity," or "insulin sensitivity ratio," or just "isr") of 1 unit for each 50 by which your glucose exceeds 100.  Please see Vaughan Basta and I both next week.

## 2019-05-16 ENCOUNTER — Other Ambulatory Visit: Payer: Self-pay

## 2019-05-16 ENCOUNTER — Ambulatory Visit (INDEPENDENT_AMBULATORY_CARE_PROVIDER_SITE_OTHER): Payer: BC Managed Care – PPO | Admitting: Endocrinology

## 2019-05-16 ENCOUNTER — Encounter: Payer: Self-pay | Admitting: Endocrinology

## 2019-05-16 ENCOUNTER — Encounter: Payer: BC Managed Care – PPO | Admitting: Nutrition

## 2019-05-16 VITALS — BP 110/64 | HR 75 | Ht 69.0 in | Wt 236.0 lb

## 2019-05-16 DIAGNOSIS — E109 Type 1 diabetes mellitus without complications: Secondary | ICD-10-CM

## 2019-05-16 DIAGNOSIS — E119 Type 2 diabetes mellitus without complications: Secondary | ICD-10-CM

## 2019-05-16 DIAGNOSIS — L089 Local infection of the skin and subcutaneous tissue, unspecified: Secondary | ICD-10-CM | POA: Diagnosis not present

## 2019-05-16 MED ORDER — GLUCAGON EMERGENCY 1 MG IJ KIT: 1.0000 mg | PACK | Freq: Once | INTRAMUSCULAR | 12 refills | Status: AC | PRN

## 2019-05-16 MED ORDER — GLUCAGON EMERGENCY 1 MG IJ KIT: 1.0000 mg | PACK | Freq: Once | INTRAMUSCULAR | 12 refills | Status: DC | PRN

## 2019-05-16 NOTE — Patient Instructions (Addendum)
Please take these pump settings: basal rate of 1.2 units/hr, 24 HRS per day.   bolus of 1 unit/1.2 grams carbohydrate.   correction bolus (which some people call "sensitivity," or "insulin sensitivity ratio," or just "isr") of 1 unit for each 50 by which your glucose exceeds 100.  Please call if you continue to have pus at the infusion sites.   Please come back for a follow-up appointment in 2 weeks.

## 2019-05-16 NOTE — Progress Notes (Signed)
Subjective:    Patient ID: Andre Brown, male    DOB: Jul 01, 1981, 38 y.o.   MRN: [id number]  HPI Pt returns for f/u of diabetes mellitus: DM type: 1 Dx'ed: 1999 Complications: none Therapy: insulin since 2002, and pump rx since 2020 DKA: once (2002) Severe hypoglycemia: last episode was 2017 Pancreatitis: once (2016) Pancreatic imaging: never.   Other: he has dexcom continuous glucose monitor.  He is considering pump rx.   Interval history: he takes these settings: basal rate of 1.4 units/hr, 124 hrs per day bolus of 1 unit/1.5 grams carbohydrate correction bolus (which some people call "sensitivity," or "insulin sensitivity ratio," or just "isr") of 1 unit for each 50 by which your glucose exceeds 100  I reviewed continuous glucose monitor data.  Glucose varies from 40-400.  It is in general higher PC than AC.  Inconsistency has been reconciled, and insulin is delivered as rx'ed.  Pt says he does not miss any boluses.  TDD is 30 units.   He has slight itching at infusion site, and assoc purulent drainage.   Past Medical History:  Diagnosis Date  . Diabetes mellitus without complication ([facility])   . Hypertension   . Thyroid disease     No past surgical history on file.  Social History   Socioeconomic History  . Marital status: Married    Spouse name: Not on file  . Number of children: Not on file  . Years of education: Not on file  . Highest education level: Not on file  Occupational History  . Not on file  Social Needs  . Financial resource strain: Not on file  . Food insecurity    Worry: Not on file    Inability: Not on file  . Transportation needs    Medical: Not on file    Non-medical: Not on file  Tobacco Use  . Smoking status: Never Smoker  . Smokeless tobacco: Never Used  Substance and Sexual Activity  . Alcohol use: Yes  . Drug use: No  . Sexual activity: Not on file  Lifestyle  . Physical activity    Days per week: Not on file    Minutes per  session: Not on file  . Stress: Not on file  Relationships  . Social Musicianconnections    Talks on phone: Not on file    Gets together: Not on file    Attends religious service: Not on file    Active member of club or organization: Not on file    Attends meetings of clubs or organizations: Not on file    Relationship status: Not on file  . Intimate partner violence    Fear of current or ex partner: Not on file    Emotionally abused: Not on file    Physically abused: Not on file    Forced sexual activity: Not on file  Other Topics Concern  . Not on file  Social History Narrative  . Not on file    Current Outpatient Medications on File Prior to Visit  Medication Sig Dispense Refill  . albuterol (PROVENTIL HFA;VENTOLIN HFA) 108 (90 Base) MCG/ACT inhaler Inhale 1-2 puffs into the lungs every 4 (four) hours as needed for wheezing or shortness of breath. 1 Inhaler 0  . insulin aspart (NOVOLOG) 100 UNIT/ML injection For use in pump, total of 140 units per day 126 mL 2  . levothyroxine (SYNTHROID, LEVOTHROID) 100 MCG tablet Take 175 mcg by mouth daily before breakfast.  No current facility-administered medications on file prior to visit.     No Known Allergies  Family History  Problem Relation Age of Onset  . Diabetes Neg Hx     BP 110/64 (BP Location: Left Arm, Patient Position: Sitting, Cuff Size: Large)   Pulse 75   Ht 5\' 9"  (1.753 m)   Wt 236 lb (107 kg)   SpO2 97%   BMI 34.85 kg/m   Review of Systems Denies LOC and fever.    Objective:   Physical Exam VITAL SIGNS:  See vs page GENERAL: no distress SKIN:  Insulin infusion site at the anterior abdomen has a few mm pustule.       Assessment & Plan:  Type 1 DM: The pattern of his cbg's indicates he needs some adjustment in his therapy Infusion site infection, new. He may need to change site more often.   Patient Instructions  Please take these pump settings: basal rate of 1.2 units/hr, 24 HRS per day.   bolus of 1  unit/1.2 grams carbohydrate.   correction bolus (which some people call "sensitivity," or "insulin sensitivity ratio," or just "isr") of 1 unit for each 50 by which your glucose exceeds 100.  Please call if you continue to have pus at the infusion sites.   Please come back for a follow-up appointment in 2 weeks.

## 2019-05-17 NOTE — Progress Notes (Signed)
We reviewed all topics on the pump checklist and sick day guidelines, high blood sugar protocols and using the pump and CGM.  He signed the checklist as understanding all topics.  He was given the checklist of topics that he did not pick up last weekend.  He downloaded the new t-connect mobil app that will allow continuous streaming of his pump to the clouds. He had no final questions.

## 2019-05-17 NOTE — Patient Instructions (Signed)
Read over handouts given Download the new t connect mobil app

## 2019-05-26 DIAGNOSIS — H25812 Combined forms of age-related cataract, left eye: Secondary | ICD-10-CM | POA: Diagnosis not present

## 2019-05-26 DIAGNOSIS — Z01818 Encounter for other preprocedural examination: Secondary | ICD-10-CM | POA: Diagnosis not present

## 2019-05-31 ENCOUNTER — Other Ambulatory Visit: Payer: Self-pay

## 2019-06-02 ENCOUNTER — Ambulatory Visit (INDEPENDENT_AMBULATORY_CARE_PROVIDER_SITE_OTHER): Payer: BC Managed Care – PPO | Admitting: Endocrinology

## 2019-06-02 ENCOUNTER — Other Ambulatory Visit: Payer: Self-pay

## 2019-06-02 ENCOUNTER — Encounter: Payer: Self-pay | Admitting: Endocrinology

## 2019-06-02 ENCOUNTER — Telehealth: Payer: Self-pay | Admitting: Endocrinology

## 2019-06-02 VITALS — BP 98/66 | HR 75 | Temp 97.4°F | Wt 239.6 lb

## 2019-06-02 DIAGNOSIS — L539 Erythematous condition, unspecified: Secondary | ICD-10-CM

## 2019-06-02 DIAGNOSIS — T8089XA Other complications following infusion, transfusion and therapeutic injection, initial encounter: Secondary | ICD-10-CM

## 2019-06-02 DIAGNOSIS — L299 Pruritus, unspecified: Secondary | ICD-10-CM

## 2019-06-02 DIAGNOSIS — E109 Type 1 diabetes mellitus without complications: Secondary | ICD-10-CM | POA: Diagnosis not present

## 2019-06-02 NOTE — Telephone Encounter (Signed)
please contact patient: As I am going through your pump download more, I see your basal is really 1.5 units/hr. Therefore, please reduce to 1.4 units/hr.  I'll see you next time.

## 2019-06-02 NOTE — Patient Instructions (Addendum)
Please take these pump settings: basal rate of 1.3 units/hr, 24 HRS per day.   bolus of 1 unit/1.5 grams carbohydrate.   correction bolus (which some people call "sensitivity," or "insulin sensitivity ratio," or just "isr") of 1 unit for each 50 by which your glucose exceeds 100.  Please see Andre Brown, about the problems at the infusion sites.   Please come back for a follow-up appointment in 2 weeks.

## 2019-06-02 NOTE — Progress Notes (Signed)
Subjective:    Patient ID: Andre Brown, male    DOB: Jul 10, 1981, 38 y.o.   MRN: [id number]  HPI Pt returns for f/u of diabetes mellitus: DM type: 1 Dx'ed: 6295 Complications: none Therapy: insulin since 2002, and pump rx since 2020 DKA: once (2002) Severe hypoglycemia: last episode was 2017 Pancreatitis: once (2016) Pancreatic imaging: never.   Other: he has dexcom continuous glucose monitor.  Interval history: he takes these settings: basal rate of 1.4 units/hr, 24 HRS per day.   bolus of 1 unit/1.5 grams carbohydrate.   correction bolus (which some people call "sensitivity," or "insulin sensitivity ratio," or just "isr") of 1 unit for each 50 by which your glucose exceeds 100.   I reviewed continuous glucose monitor data.  Glucose varies from 50-400.  It is highest and most variable at 6 PM.  Pt says this is due to drinking soda to rx hypoglycemia at work.  He still has erythema at infusion sites, and itching there.   TDD is 85 units (53% bolus) Past Medical History:  Diagnosis Date  . Diabetes mellitus without complication ([place])   . Hypertension   . Thyroid disease     History reviewed. No pertinent surgical history.  Social History   Socioeconomic History  . Marital status: Married    Spouse name: Not on file  . Number of children: Not on file  . Years of education: Not on file  . Highest education level: Not on file  Occupational History  . Not on file  Social Needs  . Financial resource strain: Not on file  . Food insecurity    Worry: Not on file    Inability: Not on file  . Transportation needs    Medical: Not on file    Non-medical: Not on file  Tobacco Use  . Smoking status: Never Smoker  . Smokeless tobacco: Never Used  Substance and Sexual Activity  . Alcohol use: Yes  . Drug use: No  . Sexual activity: Not on file  Lifestyle  . Physical activity    Days per week: Not on file    Minutes per session: Not on file  . Stress: Not on file   Relationships  . Social Herbalist on phone: Not on file    Gets together: Not on file    Attends religious service: Not on file    Active member of club or organization: Not on file    Attends meetings of clubs or organizations: Not on file    Relationship status: Not on file  . Intimate partner violence    Fear of current or ex partner: Not on file    Emotionally abused: Not on file    Physically abused: Not on file    Forced sexual activity: Not on file  Other Topics Concern  . Not on file  Social History Narrative  . Not on file    Current Outpatient Medications on File Prior to Visit  Medication Sig Dispense Refill  . albuterol (PROVENTIL HFA;VENTOLIN HFA) 108 (90 Base) MCG/ACT inhaler Inhale 1-2 puffs into the lungs every 4 (four) hours as needed for wheezing or shortness of breath. 1 Inhaler 0  . glucagon (GLUCAGON EMERGENCY) 1 MG injection Inject 1 mg into the muscle once as needed for up to 1 dose. 1 each 12  . insulin aspart (NOVOLOG) 100 UNIT/ML injection For use in pump, total of 140 units per day 126 mL 2  . levothyroxine (SYNTHROID,  LEVOTHROID) 100 MCG tablet Take 175 mcg by mouth daily before breakfast.      No current facility-administered medications on file prior to visit.     No Known Allergies  Family History  Problem Relation Age of Onset  . Diabetes Neg Hx     BP 98/66   Pulse 75   Temp (!) 97.4 F (36.3 C)   Wt 239 lb 9.6 oz (108.7 kg)   SpO2 97%   BMI 35.38 kg/m    Review of Systems Denies LOC    Objective:   Physical Exam VITAL SIGNS:  See vs page GENERAL: no distress Pulses: dorsalis pedis intact bilat.   MSK: no deformity of the feet CV: trace bilat leg edema Skin:  no ulcer on the feet.  normal color and temp on the feet. Neuro: sensation is intact to touch on the feet  Lab Results  Component Value Date   HGBA1C 7.2 (A) 05/13/2019       Assessment & Plan:  Type 1 DM: The pattern of his cbg's indicates he needs  some adjustment in his therapy Hypoglycemia: this limits aggressiveness of glycemic control Infusion site reaction: please see Bonita QuinLinda, to address  Patient Instructions  Please take these pump settings: basal rate of 1.3 units/hr, 24 HRS per day.   bolus of 1 unit/1.5 grams carbohydrate.   correction bolus (which some people call "sensitivity," or "insulin sensitivity ratio," or just "isr") of 1 unit for each 50 by which your glucose exceeds 100.  Please see Bonita QuinLinda, about the problems at the infusion sites.   Please come back for a follow-up appointment in 2 weeks.

## 2019-06-02 NOTE — Telephone Encounter (Signed)
Called pt and gave him MD message. Pt verbalized understanding and started that he did know how to change the basal rate himself.

## 2019-06-10 DIAGNOSIS — H2512 Age-related nuclear cataract, left eye: Secondary | ICD-10-CM | POA: Diagnosis not present

## 2019-06-10 DIAGNOSIS — H25812 Combined forms of age-related cataract, left eye: Secondary | ICD-10-CM | POA: Diagnosis not present

## 2019-06-13 ENCOUNTER — Other Ambulatory Visit: Payer: Self-pay

## 2019-06-15 ENCOUNTER — Telehealth: Payer: Self-pay | Admitting: Endocrinology

## 2019-06-15 ENCOUNTER — Ambulatory Visit: Payer: BC Managed Care – PPO | Admitting: Nutrition

## 2019-06-15 ENCOUNTER — Other Ambulatory Visit: Payer: Self-pay

## 2019-06-15 ENCOUNTER — Ambulatory Visit (INDEPENDENT_AMBULATORY_CARE_PROVIDER_SITE_OTHER): Payer: BC Managed Care – PPO | Admitting: Endocrinology

## 2019-06-15 ENCOUNTER — Encounter: Payer: Self-pay | Admitting: Endocrinology

## 2019-06-15 ENCOUNTER — Encounter: Payer: BC Managed Care – PPO | Attending: Endocrinology | Admitting: Nutrition

## 2019-06-15 VITALS — BP 100/66 | HR 91 | Ht 69.0 in | Wt 235.2 lb

## 2019-06-15 DIAGNOSIS — E1165 Type 2 diabetes mellitus with hyperglycemia: Secondary | ICD-10-CM | POA: Insufficient documentation

## 2019-06-15 DIAGNOSIS — E119 Type 2 diabetes mellitus without complications: Secondary | ICD-10-CM | POA: Insufficient documentation

## 2019-06-15 DIAGNOSIS — Z794 Long term (current) use of insulin: Secondary | ICD-10-CM | POA: Diagnosis not present

## 2019-06-15 DIAGNOSIS — E109 Type 1 diabetes mellitus without complications: Secondary | ICD-10-CM | POA: Diagnosis not present

## 2019-06-15 NOTE — Progress Notes (Signed)
Subjective:    Patient ID: Andre Brown, male    DOB: 1981-03-15, 38 y.o.   MRN: [id number]  HPI Pt returns for f/u of diabetes mellitus: DM type: 1 Dx'ed: 6160 Complications: none Therapy: insulin since 2002, and pump rx since 2020 DKA: once (2002) Severe hypoglycemia: last episode was 2017 Pancreatitis: once (2016) Pancreatic imaging: never.   Other: he has dexcom continuous glucose monitor.  Interval history: he takes these settings:  basal rate of 1.4 units/hr, 24 HRS per day.   bolus of 1 unit/1.5 grams carbohydrate.   correction bolus (which some people call "sensitivity," or "insulin sensitivity ratio," or just "isr") of 1 unit for each 50 by which your glucose exceeds 100.  I reviewed continuous glucose monitor data.  Glucose varies from 40-381.  It is highest and most variable in the evening.  Pt says this is due to drinking soda to rx hypoglycemia at work.   TDD is 78 units (56% bolus).   Past Medical History:  Diagnosis Date  . Diabetes mellitus without complication ([place])   . Hypertension   . Thyroid disease     No past surgical history on file.  Social History   Socioeconomic History  . Marital status: Married    Spouse name: Not on file  . Number of children: Not on file  . Years of education: Not on file  . Highest education level: Not on file  Occupational History  . Not on file  Social Needs  . Financial resource strain: Not on file  . Food insecurity    Worry: Not on file    Inability: Not on file  . Transportation needs    Medical: Not on file    Non-medical: Not on file  Tobacco Use  . Smoking status: Never Smoker  . Smokeless tobacco: Never Used  Substance and Sexual Activity  . Alcohol use: Yes  . Drug use: No  . Sexual activity: Not on file  Lifestyle  . Physical activity    Days per week: Not on file    Minutes per session: Not on file  . Stress: Not on file  Relationships  . Social Herbalist on phone: Not on file     Gets together: Not on file    Attends religious service: Not on file    Active member of club or organization: Not on file    Attends meetings of clubs or organizations: Not on file    Relationship status: Not on file  . Intimate partner violence    Fear of current or ex partner: Not on file    Emotionally abused: Not on file    Physically abused: Not on file    Forced sexual activity: Not on file  Other Topics Concern  . Not on file  Social History Narrative  . Not on file    Current Outpatient Medications on File Prior to Visit  Medication Sig Dispense Refill  . albuterol (PROVENTIL HFA;VENTOLIN HFA) 108 (90 Base) MCG/ACT inhaler Inhale 1-2 puffs into the lungs every 4 (four) hours as needed for wheezing or shortness of breath. 1 Inhaler 0  . glucagon (GLUCAGON EMERGENCY) 1 MG injection Inject 1 mg into the muscle once as needed for up to 1 dose. 1 each 12  . insulin aspart (NOVOLOG) 100 UNIT/ML injection For use in pump, total of 140 units per day 126 mL 2  . levothyroxine (SYNTHROID, LEVOTHROID) 100 MCG tablet Take 175 mcg by mouth  daily before breakfast.      No current facility-administered medications on file prior to visit.     No Known Allergies  Family History  Problem Relation Age of Onset  . Diabetes Neg Hx     BP 100/66 (BP Location: Left Arm, Patient Position: Sitting, Cuff Size: Large)   Pulse 91   Ht 5\' 9"  (1.753 m)   Wt 235 lb 3.2 oz (106.7 kg)   SpO2 98%   BMI 34.73 kg/m    Review of Systems Denies LOC.  He attributes insomnia to nocturia and fluctuating cbg's.      Objective:   Physical Exam VITAL SIGNS:  See vs page GENERAL: no distress Pulses: dorsalis pedis intact bilat.   MSK: no deformity of the feet CV: trace bilat leg edema Skin:  no ulcer on the feet.  normal color and temp on the feet.  Slight erythema at infusion sites.   Neuro: sensation is intact to touch on the feet.      Assessment & Plan:  Type 1 DM: not well-controlled.   Device malfunction: new.   Hypoglycemia: this complicates glycemic control.    Patient Instructions  Please take these pump settings: basal rate of 1.3 units/hr, 24 HRS per day.   bolus of 1 unit/1.5 grams carbohydrate.   correction bolus (which some people call "sensitivity," or "insulin sensitivity ratio," or just "isr") of 1 unit for each 50 by which your glucose exceeds 100.  Please see , about the problems at the infusion sites, and the problems with your continuous glucose monitor  Please come back for a follow-up appointment in 2 weeks.

## 2019-06-15 NOTE — Telephone Encounter (Signed)
Pt states he does not understand your question. He eats in the evening and this would account for variations in his blood sugars. He expressed confusion about why he would be asked this because he felt this would be assumed and felt you may be reading the wrong data.

## 2019-06-15 NOTE — Telephone Encounter (Signed)
LVM requesting returned call 

## 2019-06-15 NOTE — Progress Notes (Signed)
Patient says he has not felt well since going on this pump.  Infusion sites are red and stay red for 1-2 weeks after the canula is removed.  Red area is only at the canula site, not the tape.  He was given a needle infusion to try for this.   His infusion set is 1 inch away from his sensor.  Said that the infusion set must be at least 4 inches away to get a true reading. Tested blood sugar.  His blood reading was 50 points higher than his sensor reading.   He has never calibrated his blood sugar with his Dexcom.  He was shown how to do this on his pump. Says blood sugars are climbing from 3AM to 6AM.  Suggested he talk to Dr. Loanne Drilling about this, so his basal rate may need to be adjusted upward at that time.  Pump download show this.

## 2019-06-15 NOTE — Telephone Encounter (Signed)
Please add 4 units to your calculated supper bolus. I'll see you next time.

## 2019-06-15 NOTE — Telephone Encounter (Signed)
please contact patient: I didn't catch you when you were here.  I got continuous glucose monitor info.  Why do you think it varies so much in the evening?

## 2019-06-15 NOTE — Patient Instructions (Addendum)
Please take these pump settings: basal rate of 1.3 units/hr, 24 HRS per day.   bolus of 1 unit/1.5 grams carbohydrate.   correction bolus (which some people call "sensitivity," or "insulin sensitivity ratio," or just "isr") of 1 unit for each 50 by which your glucose exceeds 100.  Please see Vaughan Basta, about the problems at the infusion sites, and the problems with your continuous glucose monitor  Please come back for a follow-up appointment in 2 weeks.

## 2019-06-16 NOTE — Telephone Encounter (Signed)
Called pt and made him aware. Verbalized acceptance and understanding. 

## 2019-06-16 NOTE — Patient Instructions (Signed)
Calibrate sensor once a week.   Keep infusion set 4 inches from sensor. Try a needle infusion and let me know if this causes redness.   Tell Dr. Loanne Drilling about blood sugar rises from 3-6AM.   Call if questions

## 2019-06-17 ENCOUNTER — Other Ambulatory Visit: Payer: Self-pay

## 2019-06-17 DIAGNOSIS — Z20822 Contact with and (suspected) exposure to covid-19: Secondary | ICD-10-CM

## 2019-06-17 DIAGNOSIS — R6889 Other general symptoms and signs: Secondary | ICD-10-CM | POA: Diagnosis not present

## 2019-06-18 LAB — NOVEL CORONAVIRUS, NAA: SARS-CoV-2, NAA: NOT DETECTED

## 2019-06-19 ENCOUNTER — Telehealth: Payer: Self-pay | Admitting: Family Medicine

## 2019-06-19 ENCOUNTER — Other Ambulatory Visit: Payer: Self-pay | Admitting: Endocrinology

## 2019-06-19 DIAGNOSIS — E1065 Type 1 diabetes mellitus with hyperglycemia: Secondary | ICD-10-CM

## 2019-06-19 NOTE — Telephone Encounter (Signed)
Received call from [facility] RN. Patient is out of his CGM and needs a refill so he can monitor his glucose. He needs a 90 day supply. There is a refill request in the system and I went ahead and refilled for 90 days. I will send to Dr Ellison to arrange for further refills.  

## 2019-06-19 NOTE — Telephone Encounter (Signed)
Received call from [facility] RN. Patient is out of his CGM and needs a refill so he can monitor his glucose. He needs a 90 day supply. There is a refill request in the system and I went ahead and refilled for 90 days. I will send to Dr Loanne Drilling to arrange for further refills.

## 2019-06-19 NOTE — Telephone Encounter (Signed)
thanks

## 2019-06-24 DIAGNOSIS — H2511 Age-related nuclear cataract, right eye: Secondary | ICD-10-CM | POA: Diagnosis not present

## 2019-06-24 DIAGNOSIS — H25811 Combined forms of age-related cataract, right eye: Secondary | ICD-10-CM | POA: Diagnosis not present

## 2019-06-28 ENCOUNTER — Other Ambulatory Visit: Payer: Self-pay

## 2019-06-28 DIAGNOSIS — Z20828 Contact with and (suspected) exposure to other viral communicable diseases: Secondary | ICD-10-CM | POA: Diagnosis not present

## 2019-06-28 DIAGNOSIS — Z20822 Contact with and (suspected) exposure to covid-19: Secondary | ICD-10-CM

## 2019-06-29 ENCOUNTER — Other Ambulatory Visit: Payer: Self-pay

## 2019-07-01 ENCOUNTER — Ambulatory Visit: Payer: BC Managed Care – PPO | Admitting: Endocrinology

## 2019-07-01 DIAGNOSIS — H2513 Age-related nuclear cataract, bilateral: Secondary | ICD-10-CM | POA: Diagnosis not present

## 2019-07-01 LAB — NOVEL CORONAVIRUS, NAA: SARS-CoV-2, NAA: NOT DETECTED

## 2019-07-04 ENCOUNTER — Ambulatory Visit: Payer: BC Managed Care – PPO | Admitting: Endocrinology

## 2019-07-04 DIAGNOSIS — Z0289 Encounter for other administrative examinations: Secondary | ICD-10-CM

## 2019-07-06 DIAGNOSIS — H2513 Age-related nuclear cataract, bilateral: Secondary | ICD-10-CM | POA: Diagnosis not present

## 2019-08-08 ENCOUNTER — Telehealth: Payer: Self-pay

## 2019-08-08 NOTE — Telephone Encounter (Signed)
Company: [employer]  Document: [employer] pump supplies Other records requested: None requested  All above requested information has been faxed successfully to [employer] listed above. Documents and fax confirmation have been placed in the faxed file for future reference.

## 2019-08-09 DIAGNOSIS — E109 Type 1 diabetes mellitus without complications: Secondary | ICD-10-CM | POA: Diagnosis not present

## 2019-08-09 DIAGNOSIS — E1065 Type 1 diabetes mellitus with hyperglycemia: Secondary | ICD-10-CM | POA: Diagnosis not present

## 2019-08-09 DIAGNOSIS — Z794 Long term (current) use of insulin: Secondary | ICD-10-CM | POA: Diagnosis not present

## 2019-08-16 ENCOUNTER — Other Ambulatory Visit: Payer: Self-pay | Admitting: Internal Medicine

## 2019-08-16 ENCOUNTER — Ambulatory Visit
Admission: RE | Admit: 2019-08-16 | Discharge: 2019-08-16 | Disposition: A | Discharge status: Home / Self Care | Payer: BC Managed Care – PPO | Source: Ambulatory Visit | Attending: Internal Medicine | Admitting: Internal Medicine

## 2019-08-16 DIAGNOSIS — R05 Cough: Secondary | ICD-10-CM

## 2019-08-16 DIAGNOSIS — E039 Hypothyroidism, unspecified: Secondary | ICD-10-CM | POA: Diagnosis not present

## 2019-08-16 DIAGNOSIS — R059 Cough, unspecified: Secondary | ICD-10-CM

## 2019-08-16 DIAGNOSIS — E7800 Pure hypercholesterolemia, unspecified: Secondary | ICD-10-CM | POA: Diagnosis not present

## 2019-08-16 DIAGNOSIS — Z23 Encounter for immunization: Secondary | ICD-10-CM | POA: Diagnosis not present

## 2019-08-31 ENCOUNTER — Ambulatory Visit
Admission: EM | Admit: 2019-08-31 | Discharge: 2019-08-31 | Disposition: A | Discharge status: Home / Self Care | Payer: BC Managed Care – PPO | Attending: Emergency Medicine | Admitting: Emergency Medicine

## 2019-08-31 DIAGNOSIS — N481 Balanitis: Secondary | ICD-10-CM | POA: Insufficient documentation

## 2019-08-31 DIAGNOSIS — R360 Urethral discharge without blood: Secondary | ICD-10-CM | POA: Diagnosis not present

## 2019-08-31 MED ORDER — TRIAMCINOLONE ACETONIDE 0.1 % EX CREA: 1.0000 "application " | TOPICAL_CREAM | Freq: Two times a day (BID) | CUTANEOUS | 0 refills | Status: AC

## 2019-08-31 MED ORDER — KETOCONAZOLE 2 % EX CREA: 1.0000 "application " | TOPICAL_CREAM | Freq: Every day | CUTANEOUS | 0 refills | Status: AC

## 2019-08-31 NOTE — Discharge Instructions (Addendum)
Testing for chlamydia, gonorrhea, trichomonas is pending: please look for these results on the MyChart app/website.  We will notify you if you are positive and outline treatment at that time.  Apply equal part mixture of ketoconazole, triamcinolone and apply to affected area.

## 2019-08-31 NOTE — ED Provider Notes (Signed)
Andre Brown    CSN: [id number] Arrival date & time: 08/31/19  0904      History   Chief Complaint Chief Complaint  Patient presents with  . penile discharge    HPI Andre Brown is a 38 y.o. male with history diabetes, hypertension, presenting for 2-day course of meatal irritation, swelling, penile discharge.  Endorsing burning with urination this morning.  Denies abdominal, back pain.  No hematuria, decreased urine stream.  Patient currently sexually active with 1 male partner not routinely using condoms.  Patient does have history of balanitis second to yeast, for which he uses ketoconazole.  States he ran out of this, so he tried Listerine.  Denies testicular pain, swelling.   Past Medical History:  Diagnosis Date  . Diabetes mellitus without complication (HCC)   . Thyroid disease     Patient Active Problem List   Diagnosis Date Noted  . History of muscle stiffness 12/22/2018  . Diabetes (HCC) 12/22/2018    History reviewed. No pertinent surgical history.     Home Medications    Prior to Admission medications   Medication Sig Start Date End Date Taking? Authorizing Provider  Insulin Degludec (TRESIBA [place]) Inject into the skin.   Yes [provider]  albuterol (PROVENTIL HFA;VENTOLIN HFA) 108 (90 Base) MCG/ACT inhaler Inhale 1-2 puffs into the lungs every 4 (four) hours as needed for wheezing or shortness of breath. 10/29/18   Isa RankinMurray, Laura Wilson, MD  Continuous Blood Gluc Sensor (DEXCOM G6 SENSOR) MISC USE TO MONITOR GLUCOSE. CHANGE EVERY 10 DAYS. 06/19/19   Glori LuisSonnenberg, Eric G, MD  Continuous Blood Gluc Transmit (DEXCOM G6 TRANSMITTER) MISC 1 EACH BY MISC.(NON-DRUG COMBO ROUTE) ROUTE EVERY 3 MONTHS. 06/19/19   Glori LuisSonnenberg, Eric G, MD  glucagon (GLUCAGON EMERGENCY) 1 MG injection Inject 1 mg into the muscle once as needed for up to 1 dose. 05/16/19   Romero BellingEllison, Sean, MD  insulin aspart (NOVOLOG) 100 UNIT/ML injection For use in pump, total of 140  units per day 05/09/19   Romero BellingEllison, Sean, MD  ketoconazole (NIZORAL) 2 % cream Apply 1 application topically daily. 08/31/19   Hall-Potvin, [place], PA-C  levothyroxine (SYNTHROID, LEVOTHROID) 100 MCG tablet Take 175 mcg by mouth daily before breakfast.     [provider]  triamcinolone cream (KENALOG) 0.1 % Apply 1 application topically 2 (two) times daily. 08/31/19   Hall-Potvin, [place], PA-C    Family History Family History  Problem Relation Age of Onset  . Diabetes Neg Hx     Social History Social History   Tobacco Use  . Smoking status: Never Smoker  . Smokeless tobacco: Never Used  Substance Use Topics  . Alcohol use: Yes  . Drug use: No     Allergies   Patient has no known allergies.   Review of Systems Review of Systems  Constitutional: Negative for fatigue and fever.  Respiratory: Negative for cough and shortness of breath.   Cardiovascular: Negative for chest pain and palpitations.  Gastrointestinal: Negative for abdominal pain, diarrhea and vomiting.  Genitourinary: Positive for discharge and dysuria. Negative for flank pain, frequency, genital sores, hematuria, penile pain, penile swelling, scrotal swelling, testicular pain and urgency.  Musculoskeletal: Negative for arthralgias and myalgias.  Skin: Negative for rash and wound.  Neurological: Negative for speech difficulty and headaches.  All other systems reviewed and are negative.    Physical Exam Triage Vital Signs ED Triage Vitals  Enc Vitals Group     BP  Pulse      Resp      Temp      Temp src      SpO2      Weight      Height      Head Circumference      Peak Flow      Pain Score      Pain Loc      Pain Edu?      Excl. in [place]?    No data found.  Updated Vital Signs BP (!) 158/99 (BP Location: Left Arm)   Pulse 95   Temp 98.4 F (36.9 C) (Oral)   Resp 18   SpO2 95%   Visual Acuity Right Eye Distance:   Left Eye Distance:   Bilateral Distance:    Right Eye Near:    Left Eye Near:    Bilateral Near:     Physical Exam Exam conducted with a chaperone present.  Constitutional:      General: He is not in acute distress. HENT:     Head: Normocephalic and atraumatic.  Eyes:     General: No scleral icterus.    Pupils: Pupils are equal, round, and reactive to light.  Cardiovascular:     Rate and Rhythm: Normal rate.  Pulmonary:     Effort: Pulmonary effort is normal. No respiratory distress.     Breath sounds: No wheezing.  Genitourinary:    Penis: Uncircumcised. No phimosis, paraphimosis, tenderness or lesions.      Scrotum/Testes: Normal.     Epididymis:     Right: Normal.     Left: Normal.     Tanner stage (genital): 5.     Comments: Mild erythema, swelling at meatus with scant clear discharge, no smegma. Skin:    Coloration: Skin is not jaundiced or pale.  Neurological:     Mental Status: He is alert and oriented to person, place, and time.      UC Treatments / Results  Labs (all labs ordered are listed, but only abnormal results are displayed) Labs Reviewed  CYTOLOGY, (ORAL, ANAL, URETHRAL) ANCILLARY ONLY    EKG   Radiology No results found.  Procedures Procedures (including critical Brown time)  Medications Ordered in UC Medications - No data to display  Initial Impression / Assessment and Plan / UC Course  I have reviewed the triage vital signs and the nursing notes.  Pertinent labs & imaging results that were available during my Brown of the patient were reviewed by me and considered in my medical decision making (see chart for details).     Patient afebrile, nontoxic.  Symptoms likely second to meatal irritation from Listerine use.  Low concern for epididymitis, UTI at this time.  STD panel pending: We will treat if indicated.  Will provide symptom relief with equal part mixture of ketoconazole, triamcinolone as outlined below.  Return precautions discussed, patient verbalized understanding and is agreeable to plan.  Final Clinical Impressions(s) / UC Diagnoses   Final diagnoses:  Balanitis  Penile discharge, without blood     Discharge Instructions     Testing for chlamydia, gonorrhea, trichomonas is pending: please look for these results on the MyChart app/website.  We will notify you if you are positive and outline treatment at that time.  Apply equal part mixture of ketoconazole, triamcinolone and apply to affected area.    ED Prescriptions    Medication Sig Dispense Auth. Provider   ketoconazole (NIZORAL) 2 % cream Apply 1 application topically  daily. 15 g Hall-Potvin, [place], PA-C   triamcinolone cream (KENALOG) 0.1 % Apply 1 application topically 2 (two) times daily. 30 g Hall-Potvin, [place], PA-C     PDMP not reviewed this encounter.   Hall-Potvin, [place], [place] 08/31/19 1014

## 2019-08-31 NOTE — ED Triage Notes (Signed)
Pt c/o clear penile discharge 2days and had burning on urination this am

## 2019-09-01 ENCOUNTER — Other Ambulatory Visit: Payer: Self-pay

## 2019-09-01 ENCOUNTER — Ambulatory Visit
Admission: EM | Admit: 2019-09-01 | Discharge: 2019-09-01 | Disposition: A | Discharge status: Home / Self Care | Payer: BC Managed Care – PPO | Attending: Emergency Medicine | Admitting: Emergency Medicine

## 2019-09-01 DIAGNOSIS — R8281 Pyuria: Secondary | ICD-10-CM | POA: Diagnosis not present

## 2019-09-01 LAB — POCT URINALYSIS DIP (MANUAL ENTRY)
Bilirubin, UA: NEGATIVE
Blood, UA: NEGATIVE
Glucose, UA: NEGATIVE mg/dL
Nitrite, UA: NEGATIVE
Protein Ur, POC: 30 mg/dL — AB
Spec Grav, UA: >=1.03 — AB (ref 1.010–1.025)
Urobilinogen, UA: 1 E.U./dL
pH, UA: 6 (ref 5.0–8.0)

## 2019-09-01 LAB — CYTOLOGY, (ORAL, ANAL, URETHRAL) ANCILLARY ONLY
Chlamydia: NEGATIVE
Neisseria Gonorrhea: NEGATIVE
Trichomonas: NEGATIVE

## 2019-09-01 NOTE — ED Notes (Signed)
Spoke with [place], APP about results.  Patient informed no need for treatment based on results at this time.  Will send for culture.  Discussed AZO for possible symptom treatment, told him for further medical advice he would need to be seen for a full visit.  Patient verbalized understanding of discharge instructions and follow-up.

## 2019-09-01 NOTE — ED Notes (Signed)
Patient able to ambulate independently  

## 2019-09-01 NOTE — ED Triage Notes (Signed)
PT seen yesterday for penile discomfort, and requesting following up UA.  After discussion with [place], APP, verbal order received for urinalysis and nurse visit for patient.  No further APP assessment necessary at this time.

## 2019-09-02 ENCOUNTER — Other Ambulatory Visit: Payer: Self-pay | Admitting: Endocrinology

## 2019-09-02 DIAGNOSIS — E1065 Type 1 diabetes mellitus with hyperglycemia: Secondary | ICD-10-CM

## 2019-09-02 MED ORDER — DEXCOM G6 TRANSMITTER MISC: 1.0000 | 0 refills | Status: DC

## 2019-09-02 MED ORDER — DEXCOM G6 SENSOR MISC: 3.0000 | 0 refills | Status: DC

## 2019-09-03 LAB — URINE CULTURE: Culture: NO GROWTH

## 2019-09-05 ENCOUNTER — Other Ambulatory Visit: Payer: Self-pay

## 2019-09-05 ENCOUNTER — Ambulatory Visit
Admission: EM | Admit: 2019-09-05 | Discharge: 2019-09-05 | Disposition: A | Discharge status: Home / Self Care | Payer: BC Managed Care – PPO | Attending: Emergency Medicine | Admitting: Emergency Medicine

## 2019-09-05 ENCOUNTER — Encounter: Payer: Self-pay | Admitting: Emergency Medicine

## 2019-09-05 DIAGNOSIS — H1013 Acute atopic conjunctivitis, bilateral: Secondary | ICD-10-CM

## 2019-09-05 DIAGNOSIS — H5789 Other specified disorders of eye and adnexa: Secondary | ICD-10-CM

## 2019-09-05 MED ORDER — KETOTIFEN FUMARATE 0.025 % OP SOLN: 1.0000 [drp] | Freq: Two times a day (BID) | OPHTHALMIC | 0 refills | Status: DC

## 2019-09-05 NOTE — Discharge Instructions (Signed)
Keep follow-up ointment with corneal specialist on Wednesday for further evaluation/management if needed. Use antihistamine drops as directed. If you use your lubricant/moisturizing drops be sure to wait 10 to 50 minutes prior to application as this can decrease efficacy of antihistamine. Follow-up with eye doctor sooner if you develop change in vision, fever, headache, mucoid discharge.

## 2019-09-05 NOTE — ED Notes (Signed)
Patient able to ambulate independently  

## 2019-09-05 NOTE — ED Triage Notes (Addendum)
PT presents to [facility] for assessment of bilateral itching and redness x 2 days.  States he stopped taking his Claritin because he felt it was causing his penile issues he was seen for recently, and now his eyes are itching.  Cataract surgery in September

## 2019-09-05 NOTE — ED Provider Notes (Signed)
[facility]    CSN: [id number] Arrival date & time: 09/05/19  1050      History   Chief Complaint Chief Complaint  Patient presents with  . Itchy eyes    HPI Andre Brown is a 38 y.o. male with history of diabetes, thyroid disease presenting for 2-day course of bilateral eye redness, itching.  States he stopped taking Claritin for allergies due to clear penile discharge that developed shortly after beginning course.  Denies change in vision, mucoid discharge, fever, eye pain.  No foreign body sensation or exposure.  Has tried lubricant eyedrops with minimal relief.  Of note patient is status post cataract surgery: Has follow-up with his eye provider on Wednesday, intends to keep appointment.   Past Medical History:  Diagnosis Date  . Diabetes mellitus without complication ([place])   . Thyroid disease     Patient Active Problem List   Diagnosis Date Noted  . History of muscle stiffness 12/22/2018  . Diabetes ([facility]) 12/22/2018    History reviewed. No pertinent surgical history.     Home Medications    Prior to Admission medications   Medication Sig Start Date End Date Taking? Authorizing Provider  albuterol (PROVENTIL HFA;VENTOLIN HFA) 108 (90 Base) MCG/ACT inhaler Inhale 1-2 puffs into the lungs every 4 (four) hours as needed for wheezing or shortness of breath. 10/29/18   Wynona Luna, MD  Continuous Blood Gluc Sensor (DEXCOM G6 SENSOR) MISC USE TO MONITOR GLUCOSE. CHANGE EVERY 10 DAYS. 09/02/19   Renato Shin, MD  Continuous Blood Gluc Sensor (DEXCOM G6 SENSOR) MISC 3 each by Other route See admin instructions. Apply one sensor to body once every 10 days. 09/02/19   Elayne Snare, MD  Continuous Blood Gluc Transmit (DEXCOM G6 TRANSMITTER) MISC USE AS DIRECTED EVERY 3 MONTHS 09/02/19   Renato Shin, MD  Continuous Blood Gluc Transmit (DEXCOM G6 TRANSMITTER) MISC 1 each by Other route See admin instructions. Use one transmitter once every 90 days.  09/02/19   Elayne Snare, MD  glucagon (GLUCAGON EMERGENCY) 1 MG injection Inject 1 mg into the muscle once as needed for up to 1 dose. 05/16/19   Renato Shin, MD  insulin aspart (NOVOLOG) 100 UNIT/ML injection For use in pump, total of 140 units per day 05/09/19   Renato Shin, MD  Insulin Degludec (TRESIBA [place]) Inject into the skin.    [provider]  ketoconazole (NIZORAL) 2 % cream Apply 1 application topically daily. 08/31/19   Hall-Potvin, [place], PA-C  ketotifen (ZADITOR) 0.025 % ophthalmic solution Place 1 drop into both eyes 2 (two) times daily. 09/05/19   Hall-Potvin, [place], PA-C  levothyroxine (SYNTHROID, LEVOTHROID) 100 MCG tablet Take 175 mcg by mouth daily before breakfast.     [provider]  triamcinolone cream (KENALOG) 0.1 % Apply 1 application topically 2 (two) times daily. 08/31/19   Hall-Potvin, [place], PA-C    Family History Family History  Problem Relation Age of Onset  . Diabetes Neg Hx     Social History Social History   Tobacco Use  . Smoking status: Never Smoker  . Smokeless tobacco: Never Used  Substance Use Topics  . Alcohol use: Yes  . Drug use: No     Allergies   Patient has no known allergies.   Review of Systems Review of Systems  Constitutional: Negative for fatigue and fever.  HENT: Negative for congestion, dental problem, ear pain, facial swelling, hearing loss, sinus pain, sore throat, trouble swallowing and voice change.  Eyes: Positive for discharge, redness and itching. Negative for photophobia, pain and visual disturbance.  Respiratory: Negative for cough and shortness of breath.   Cardiovascular: Negative for chest pain and palpitations.  Gastrointestinal: Negative for diarrhea and vomiting.  Musculoskeletal: Negative for arthralgias and myalgias.  Neurological: Negative for dizziness and headaches.     Physical Exam Triage Vital Signs ED Triage Vitals [09/05/19 1109]  Enc Vitals Group     BP 134/88      Pulse Rate 85     Resp 18     Temp 98.2 F (36.8 C)     Temp Source Oral     SpO2 98 %     Weight      Height      Head Circumference      Peak Flow      Pain Score 0     Pain Loc      Pain Edu?      Excl. in [place]?    No data found.  Updated Vital Signs BP 134/88 (BP Location: Left Arm)   Pulse 85   Temp 98.2 F (36.8 C) (Oral)   Resp 18   SpO2 98%   Visual Acuity Right Eye Distance:   Left Eye Distance:   Bilateral Distance:    Right Eye Near:   Left Eye Near:    Bilateral Near:     Physical Exam Constitutional:      General: He is not in acute distress. HENT:     Head: Normocephalic and atraumatic.     Nose: Nose normal.     Mouth/Throat:     Mouth: Mucous membranes are moist.     Pharynx: Oropharynx is clear.  Eyes:     General: No scleral icterus.    Extraocular Movements: Extraocular movements intact.     Pupils: Pupils are equal, round, and reactive to light.     Comments: Bilateral conjunctival injection with clear, watery discharge.  No foreign body with lid eversion.  Cardiovascular:     Rate and Rhythm: Normal rate.  Pulmonary:     Effort: Pulmonary effort is normal. No respiratory distress.     Breath sounds: No wheezing.  Musculoskeletal:     Cervical back: No tenderness.  Lymphadenopathy:     Cervical: No cervical adenopathy.  Skin:    Coloration: Skin is not jaundiced or pale.  Neurological:     Mental Status: He is alert and oriented to person, place, and time.      UC Treatments / Results  Labs (all labs ordered are listed, but only abnormal results are displayed) Labs Reviewed - No data to display  EKG   Radiology No results found.  Procedures Procedures (including critical care time)  Medications Ordered in UC Medications - No data to display  Initial Impression / Assessment and Plan / UC Course  I have reviewed the triage vital signs and the nursing notes.  Pertinent labs & imaging results that were available  during my care of the patient were reviewed by me and considered in my medical decision making (see chart for details).     H&P consistent with allergic conjunctivitis versus viral: We will try antihistamines or eyedrops keep follow-up appointment with corneal specialist on Wednesday.  Return precautions discussed, patient verbalized understanding and is agreeable to plan. Final Clinical Impressions(s) / UC Diagnoses   Final diagnoses:  Allergic conjunctivitis of both eyes     Discharge Instructions     Keep follow-up ointment  with corneal specialist on Wednesday for further evaluation/management if needed. Use antihistamine drops as directed. If you use your lubricant/moisturizing drops be sure to wait 10 to 50 minutes prior to application as this can decrease efficacy of antihistamine. Follow-up with eye doctor sooner if you develop change in vision, fever, headache, mucoid discharge.    ED Prescriptions    Medication Sig Dispense Auth. Provider   ketotifen (ZADITOR) 0.025 % ophthalmic solution Place 1 drop into both eyes 2 (two) times daily. 10 mL Hall-Potvin, [place], PA-C     PDMP not reviewed this encounter.   Hall-Potvin, [place], [place] 09/05/19 1130

## 2019-09-07 LAB — NOVEL CORONAVIRUS, NAA: SARS-CoV-2, NAA: NOT DETECTED

## 2019-09-23 HISTORY — PX: CATARACT EXTRACTION, BILATERAL: SHX1313

## 2019-10-05 DIAGNOSIS — N476 Balanoposthitis: Secondary | ICD-10-CM | POA: Diagnosis not present

## 2019-10-05 DIAGNOSIS — N481 Balanitis: Secondary | ICD-10-CM | POA: Diagnosis not present

## 2019-10-05 DIAGNOSIS — N5201 Erectile dysfunction due to arterial insufficiency: Secondary | ICD-10-CM | POA: Diagnosis not present

## 2019-10-05 DIAGNOSIS — R369 Urethral discharge, unspecified: Secondary | ICD-10-CM | POA: Diagnosis not present

## 2019-11-06 ENCOUNTER — Other Ambulatory Visit: Payer: Self-pay | Admitting: Endocrinology

## 2019-11-09 ENCOUNTER — Other Ambulatory Visit: Payer: Self-pay | Admitting: Endocrinology

## 2019-11-10 ENCOUNTER — Encounter: Payer: Self-pay | Admitting: Endocrinology

## 2019-11-10 ENCOUNTER — Other Ambulatory Visit: Payer: Self-pay

## 2019-11-10 DIAGNOSIS — E109 Type 1 diabetes mellitus without complications: Secondary | ICD-10-CM

## 2019-11-10 MED ORDER — INSULIN ASPART 100 UNIT/ML ~~LOC~~ SOLN: 140.0000 [IU] | Freq: Every day | SUBCUTANEOUS | 0 refills | Status: DC

## 2019-11-14 ENCOUNTER — Ambulatory Visit: Payer: BC Managed Care – PPO | Admitting: Endocrinology

## 2019-11-14 ENCOUNTER — Other Ambulatory Visit: Payer: Self-pay

## 2019-11-14 ENCOUNTER — Encounter: Payer: Self-pay | Admitting: Endocrinology

## 2019-11-14 VITALS — BP 110/68 | HR 82 | Ht 69.0 in | Wt 237.8 lb

## 2019-11-14 DIAGNOSIS — E109 Type 1 diabetes mellitus without complications: Secondary | ICD-10-CM | POA: Diagnosis not present

## 2019-11-14 DIAGNOSIS — N529 Male erectile dysfunction, unspecified: Secondary | ICD-10-CM | POA: Diagnosis not present

## 2019-11-14 LAB — POCT GLYCOSYLATED HEMOGLOBIN (HGB A1C): Hemoglobin A1C: 7.3 % — AB (ref 4.0–5.6)

## 2019-11-14 LAB — TSH: TSH: 0.11 u[IU]/mL — ABNORMAL LOW (ref 0.35–4.50)

## 2019-11-14 MED ORDER — SILDENAFIL CITRATE 100 MG PO TABS: 50.0000 mg | ORAL_TABLET | Freq: Every day | ORAL | 11 refills | Status: DC | PRN

## 2019-11-14 MED ORDER — NOVOLOG FLEXPEN 100 UNIT/ML ~~LOC~~ SOPN: 15.0000 [IU] | PEN_INJECTOR | Freq: Three times a day (TID) | SUBCUTANEOUS | 3 refills | Status: DC

## 2019-11-14 MED ORDER — TRESIBA FLEXTOUCH 200 UNIT/ML ~~LOC~~ SOPN: 90.0000 [IU] | PEN_INJECTOR | Freq: Every day | SUBCUTANEOUS | 3 refills | Status: DC

## 2019-11-14 NOTE — Patient Instructions (Addendum)
Please decrease the Tresiba to 90 units daily, and: Please continue the same Novolog Blood tests are requested for you today.  We'll let you know about the results.  Please come back for a follow-up appointment in 2-3 months.

## 2019-11-14 NOTE — Progress Notes (Signed)
Subjective:    Patient ID: Andre Brown, male    DOB: [birth year], 39 y.o.   MRN: [id number]  HPI Pt returns for f/u of diabetes mellitus: DM type: 1 Dx'ed: 4332 Complications: none Therapy: insulin since 2002, and pump rx since 2020 DKA: once (2002) Severe hypoglycemia: last episode was 2017 Pancreatitis: once (2016) Pancreatic imaging: never.   Other: he has dexcom continuous glucose monitor; he took pump rx for a few mos in 2020 Interval history:  He chose to d/c insulin pump 2 mos ago.  He takes [place] 100/d, and Novolog (12 units + more according to the size of the meal, for a total of 15-25 units 3 times a day (just before each meal).  However, he is having to reduce the Novolog from the calculated dose, due to postprandial hypoglycemia.  I reviewed continuous glucose monitor data.  Glucose varies from 65-320.  There is no trend throughout the day, except it is highest at HS.   Past Medical History:  Diagnosis Date  . Diabetes mellitus without complication ([place])   . Thyroid disease     No past surgical history on file.  Social History   Socioeconomic History  . Marital status: Married    Spouse name: Not on file  . Number of children: Not on file  . Years of education: Not on file  . Highest education level: Not on file  Occupational History  . Not on file  Tobacco Use  . Smoking status: Never Smoker  . Smokeless tobacco: Never Used  Substance and Sexual Activity  . Alcohol use: Yes  . Drug use: No  . Sexual activity: Not on file  Other Topics Concern  . Not on file  Social History Narrative  . Not on file   Social Determinants of Health   Financial Resource Strain:   . Difficulty of Paying Living Expenses: Not on file  Food Insecurity:   . Worried About Charity fundraiser in the Last Year: Not on file  . Ran Out of Food in the Last Year: Not on file  Transportation Needs:   . Lack of Transportation (Medical): Not on file  . Lack of Transportation  (Non-Medical): Not on file  Physical Activity:   . Days of Exercise per Week: Not on file  . Minutes of Exercise per Session: Not on file  Stress:   . Feeling of Stress : Not on file  Social Connections:   . Frequency of Communication with Friends and Family: Not on file  . Frequency of Social Gatherings with Friends and Family: Not on file  . Attends Religious Services: Not on file  . Active Member of Clubs or Organizations: Not on file  . Attends Archivist Meetings: Not on file  . Marital Status: Not on file  Intimate Partner Violence:   . Fear of Current or Ex-Partner: Not on file  . Emotionally Abused: Not on file  . Physically Abused: Not on file  . Sexually Abused: Not on file    Current Outpatient Medications on File Prior to Visit  Medication Sig Dispense Refill  . albuterol (PROVENTIL HFA;VENTOLIN HFA) 108 (90 Base) MCG/ACT inhaler Inhale 1-2 puffs into the lungs every 4 (four) hours as needed for wheezing or shortness of breath. 1 Inhaler 0  . Continuous Blood Gluc Sensor (DEXCOM G6 SENSOR) MISC 3 each by Other route See admin instructions. Apply one sensor to body once every 10 days. 9 each 0  . Continuous  Blood Gluc Transmit (DEXCOM G6 TRANSMITTER) MISC 1 each by Other route See admin instructions. Use one transmitter once every 90 days. 1 each 0  . glucagon (GLUCAGON EMERGENCY) 1 MG injection Inject 1 mg into the muscle once as needed for up to 1 dose. 1 each 12  . ketoconazole (NIZORAL) 2 % cream Apply 1 application topically daily. 15 g 0  . ketotifen (ZADITOR) 0.025 % ophthalmic solution Place 1 drop into both eyes 2 (two) times daily. 10 mL 0  . triamcinolone cream (KENALOG) 0.1 % Apply 1 application topically 2 (two) times daily. 30 g 0   No current facility-administered medications on file prior to visit.    No Known Allergies  Family History  Problem Relation Age of Onset  . Diabetes Neg Hx     BP 110/68 (BP Location: Left Arm, Patient Position:  Sitting, Cuff Size: Large)   Pulse 82   Ht 5\' 9"  (1.753 m)   Wt 237 lb 12.8 oz (107.9 kg)   SpO2 98%   BMI 35.12 kg/m    Review of Systems Denies LOC.  He has ED sxs.     Objective:   Physical Exam VITAL SIGNS:  See vs page GENERAL: no distress Pulses: dorsalis pedis intact bilat.   MSK: no deformity of the feet CV: trace bilat leg edema Skin:  no ulcer on the feet.  normal color and temp on the feet. Neuro: sensation is intact to touch on the feet   Lab Results  Component Value Date   HGBA1C 7.3 (A) 11/14/2019       Assessment & Plan:  Type 1 DM: worse Hypoglycemia: this limits aggressiveness of glycemic control, so we'll have to increase insulin cautiously   Patient Instructions  Please decrease the Tresiba to 90 units daily, and: Please continue the same Novolog Blood tests are requested for you today.  We'll let you know about the results.  Please come back for a follow-up appointment in 2-3 months.

## 2019-11-15 ENCOUNTER — Encounter: Payer: Self-pay | Admitting: Endocrinology

## 2019-11-15 LAB — BASIC METABOLIC PANEL
BUN: 13 mg/dL (ref 6–23)
CO2: 26 mEq/L (ref 19–32)
Calcium: 9.5 mg/dL (ref 8.4–10.5)
Chloride: 101 mEq/L (ref 96–112)
Creatinine, Ser: 1.06 mg/dL (ref 0.40–1.50)
GFR: 77.2 mL/min (ref >60.00)
Glucose, Bld: 88 mg/dL (ref 70–99)
Potassium: 3.9 mEq/L (ref 3.5–5.1)
Sodium: 137 mEq/L (ref 135–145)

## 2019-11-15 LAB — TESTOSTERONE,FREE AND TOTAL
Testosterone, Free: 11.4 pg/mL (ref 6.8–21.5)
Testosterone: 416 ng/dL (ref 264–916)

## 2019-11-16 ENCOUNTER — Encounter: Payer: Self-pay | Admitting: Endocrinology

## 2019-11-16 MED ORDER — LEVOTHYROXINE SODIUM 150 MCG PO TABS: 150.0000 ug | ORAL_TABLET | Freq: Every day | ORAL | 3 refills | Status: DC

## 2019-11-17 ENCOUNTER — Telehealth: Payer: Self-pay

## 2019-11-17 NOTE — Telephone Encounter (Signed)
PRIOR AUTHORIZATION  PA initiation date: 11/17/19   Insurance Company: [employer] Submission completed electronically through [employer] My Meds: Yes  Will await insurance response re: approval/denial.  Andre Brown (Key: [id number])  Your information has been submitted to [employer]. To check for an updated outcome later, reopen this PA request from your dashboard.  If [employer] has not responded to your request within 24 hours, contact [employer] at [phone]. If you think there may be a problem with your PA request, use our live chat feature at the bottom right.  Andre Brown Key: [id number] - PA Case ID: [id number] Need help? Call us at [phone] Status Sent to [facility] Insulin Aspart 100UNIT/ML solution Form Ambulance person PA Form (NCPDP)

## 2019-11-17 NOTE — Telephone Encounter (Signed)
DENIAL  Received notification from [facility] [employer] that PA for Novolog Flexpen has been denied. Following reasons provided: Covered alternatives are Candie Mile (pt has already been on in past AND was included in PA) and Novolog. Documents have been labeled and given to Dr. Everardo All for him to review and address.

## 2019-11-21 ENCOUNTER — Other Ambulatory Visit: Payer: Self-pay

## 2019-11-21 DIAGNOSIS — E109 Type 1 diabetes mellitus without complications: Secondary | ICD-10-CM

## 2019-11-21 MED ORDER — INSULIN ASPART 100 UNIT/ML ~~LOC~~ SOLN: SUBCUTANEOUS | 0 refills | Status: DC

## 2019-11-21 NOTE — Telephone Encounter (Signed)
Dr. Everardo All reviewed denial letter and agreed to change Rx to covered alternative, Novolog vial form. Rx has been sent per [employer] [employer] instructions and Dr. George Hugh orders.

## 2019-12-06 DIAGNOSIS — E109 Type 1 diabetes mellitus without complications: Secondary | ICD-10-CM | POA: Diagnosis not present

## 2019-12-06 DIAGNOSIS — R0789 Other chest pain: Secondary | ICD-10-CM | POA: Diagnosis not present

## 2019-12-06 DIAGNOSIS — Z0000 Encounter for general adult medical examination without abnormal findings: Secondary | ICD-10-CM | POA: Diagnosis not present

## 2019-12-06 DIAGNOSIS — E7800 Pure hypercholesterolemia, unspecified: Secondary | ICD-10-CM | POA: Diagnosis not present

## 2019-12-06 DIAGNOSIS — E039 Hypothyroidism, unspecified: Secondary | ICD-10-CM | POA: Diagnosis not present

## 2019-12-08 ENCOUNTER — Other Ambulatory Visit: Payer: Self-pay

## 2019-12-08 ENCOUNTER — Encounter: Payer: Self-pay | Admitting: Endocrinology

## 2019-12-08 ENCOUNTER — Other Ambulatory Visit: Payer: Self-pay | Admitting: Endocrinology

## 2019-12-08 DIAGNOSIS — E1065 Type 1 diabetes mellitus with hyperglycemia: Secondary | ICD-10-CM

## 2019-12-08 MED ORDER — DEXCOM G6 SENSOR MISC: 3.0000 | 0 refills | Status: DC

## 2019-12-08 MED ORDER — DEXCOM G6 TRANSMITTER MISC: 1.0000 | 0 refills | Status: DC

## 2020-01-17 ENCOUNTER — Other Ambulatory Visit: Payer: Self-pay

## 2020-01-19 ENCOUNTER — Other Ambulatory Visit: Payer: Self-pay

## 2020-01-19 ENCOUNTER — Encounter: Payer: Self-pay | Admitting: Endocrinology

## 2020-01-19 ENCOUNTER — Ambulatory Visit: Payer: BC Managed Care – PPO | Admitting: Endocrinology

## 2020-01-19 VITALS — BP 120/70 | HR 75 | Ht 69.0 in | Wt 242.0 lb

## 2020-01-19 DIAGNOSIS — E109 Type 1 diabetes mellitus without complications: Secondary | ICD-10-CM

## 2020-01-19 DIAGNOSIS — E1065 Type 1 diabetes mellitus with hyperglycemia: Secondary | ICD-10-CM

## 2020-01-19 LAB — POCT GLYCOSYLATED HEMOGLOBIN (HGB A1C): Hemoglobin A1C: 7.4 % — AB (ref 4.0–5.6)

## 2020-01-19 LAB — TSH: TSH: 5.79 u[IU]/mL — ABNORMAL HIGH (ref 0.35–4.50)

## 2020-01-19 MED ORDER — INSULIN ASPART 100 UNIT/ML ~~LOC~~ SOLN: 18.0000 [IU] | Freq: Three times a day (TID) | SUBCUTANEOUS | 3 refills | Status: DC

## 2020-01-19 MED ORDER — TRESIBA FLEXTOUCH 200 UNIT/ML ~~LOC~~ SOPN: 80.0000 [IU] | PEN_INJECTOR | Freq: Every day | SUBCUTANEOUS | 3 refills | Status: DC

## 2020-01-19 NOTE — Progress Notes (Signed)
Subjective:    Patient ID: Andre Brown, male    DOB: 01/05/1981, 39 y.o.   MRN: [id number]  HPI Pt returns for f/u of diabetes mellitus: DM type: 1 Dx'ed: 3875 Complications: none Therapy: insulin since 2002, and pump rx since 2020 DKA: once (2002) Severe hypoglycemia: last episode was 2017 Pancreatitis: once (2016) Pancreatic imaging: never.   Other: he has dexcom continuous glucose monitor; he took pump rx for a few mos in 2020 Interval history:  He chose to d/c insulin pump 2 mos ago.  He takes [place] 90/d, and Novolog (12 units TID, + more according to the size of the meal, for a total of 15-18 units 3 times a day (just before each meal).  I reviewed continuous glucose monitor data.  Glucose varies from 68-520.  It is in general higher as the day goes on   Past Medical History:  Diagnosis Date  . Diabetes mellitus without complication ([place])   . Thyroid disease     No past surgical history on file.  Social History   Socioeconomic History  . Marital status: Married    Spouse name: Not on file  . Number of children: Not on file  . Years of education: Not on file  . Highest education level: Not on file  Occupational History  . Not on file  Tobacco Use  . Smoking status: Never Smoker  . Smokeless tobacco: Never Used  Substance and Sexual Activity  . Alcohol use: Yes  . Drug use: No  . Sexual activity: Not on file  Other Topics Concern  . Not on file  Social History Narrative  . Not on file   Social Determinants of Health   Financial Resource Strain:   . Difficulty of Paying Living Expenses:   Food Insecurity:   . Worried About Charity fundraiser in the Last Year:   . Arboriculturist in the Last Year:   Transportation Needs:   . Film/video editor (Medical):   Marland Kitchen Lack of Transportation (Non-Medical):   Physical Activity:   . Days of Exercise per Week:   . Minutes of Exercise per Session:   Stress:   . Feeling of Stress :   Social Connections:     . Frequency of Communication with Friends and Family:   . Frequency of Social Gatherings with Friends and Family:   . Attends Religious Services:   . Active Member of Clubs or Organizations:   . Attends Archivist Meetings:   Marland Kitchen Marital Status:   Intimate Partner Violence:   . Fear of Current or Ex-Partner:   . Emotionally Abused:   Marland Kitchen Physically Abused:   . Sexually Abused:     Current Outpatient Medications on File Prior to Visit  Medication Sig Dispense Refill  . albuterol (PROVENTIL HFA;VENTOLIN HFA) 108 (90 Base) MCG/ACT inhaler Inhale 1-2 puffs into the lungs every 4 (four) hours as needed for wheezing or shortness of breath. 1 Inhaler 0  . Continuous Blood Gluc Sensor (DEXCOM G6 SENSOR) MISC 3 each by Other route See admin instructions. Apply one sensor to body once every 10 days. 9 each 0  . Continuous Blood Gluc Transmit (DEXCOM G6 TRANSMITTER) MISC 1 each by Other route See admin instructions. Use one transmitter once every 90 days. 1 each 0  . Continuous Blood Gluc Transmit (DEXCOM G6 TRANSMITTER) MISC 1 each by Other route See admin instructions. Use one transmitter once every 90 days. 1 each 0  .  glucagon (GLUCAGON EMERGENCY) 1 MG injection Inject 1 mg into the muscle once as needed for up to 1 dose. 1 each 12  . ketoconazole (NIZORAL) 2 % cream Apply 1 application topically daily. 15 g 0  . ketotifen (ZADITOR) 0.025 % ophthalmic solution Place 1 drop into both eyes 2 (two) times daily. 10 mL 0  . levothyroxine (SYNTHROID) 150 MCG tablet Take 1 tablet (150 mcg total) by mouth daily. 90 tablet 3  . sildenafil (VIAGRA) 100 MG tablet Take 0.5-1 tablets (50-100 mg total) by mouth daily as needed for erectile dysfunction. 10 tablet 11  . triamcinolone cream (KENALOG) 0.1 % Apply 1 application topically 2 (two) times daily. 30 g 0   No current facility-administered medications on file prior to visit.    No Known Allergies  Family History  Problem Relation Age of Onset   . Diabetes Neg Hx     BP 120/70   Pulse 75   Ht 5\' 9"  (1.753 m)   Wt 242 lb (109.8 kg)   SpO2 99%   BMI 35.74 kg/m    Review of Systems He has gained weight.    Objective:   Physical Exam VITAL SIGNS:  See vs page GENERAL: no distress Pulses: dorsalis pedis intact bilat.   MSK: no deformity of the feet CV: trace bilat leg edema Skin:  no ulcer on the feet.  normal color and temp on the feet.   Neuro: sensation is intact to touch on the feet.    Lab Results  Component Value Date   HGBA1C 7.4 (A) 01/19/2020   Lab Results  Component Value Date   CREATININE 1.06 11/14/2019   BUN 13 11/14/2019   NA 137 11/14/2019   K 3.9 11/14/2019   CL 101 11/14/2019   CO2 26 11/14/2019       Assessment & Plan:  Insulin-requiring type 2 DM: The pattern of his cbg's indicates he needs some adjustment in his therapy Hypoglycemia, due to insulin: this limits aggressiveness of glycemic control Hypothyroidism: recheck today   Patient Instructions  Please decrease the Tresiba to 80 units daily, and: Increase the Novolog to 18-22 units 3 times a day (just before each meal).   Blood tests are requested for you today.  We'll let you know about the results.  Please consider adding "trulicity" or "ozempic."    Please come back for a follow-up appointment in 3 months.

## 2020-01-19 NOTE — Patient Instructions (Addendum)
Please decrease the Tresiba to 80 units daily, and: Increase the Novolog to 18-22 units 3 times a day (just before each meal).   Blood tests are requested for you today.  We'll let you know about the results.  Please consider adding "trulicity" or "ozempic."    Please come back for a follow-up appointment in 3 months.

## 2020-02-09 ENCOUNTER — Other Ambulatory Visit: Payer: Self-pay | Admitting: Endocrinology

## 2020-02-18 IMAGING — MR MR HEAD WO/W CM
11 of 13 series · 40 of 48 positions shown · IV contrast (gadavist)
Comparison: Head CT 09/22/2018

CLINICAL DATA: Altered mental status

EXAM:
MRI HEAD WITHOUT AND WITH CONTRAST
TECHNIQUE: Multiplanar, multiecho pulse sequences of the brain and surrounding
structures were obtained without and with intravenous contrast.
CONTRAST:  10 mL Gadavist

[Series 5: DWI · axial · 3.0mm · 0.92mm/px · z∈[-26,+111]mm · 9 of 94 slices shown (1 of 4)]
[im 1/94]
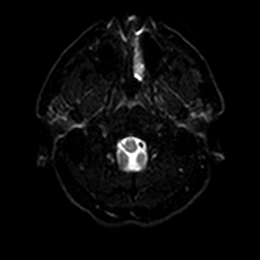
[im 12/94]
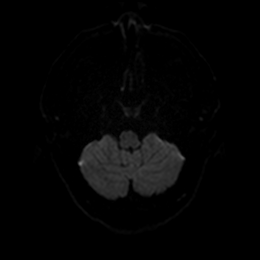
[im 24/94]
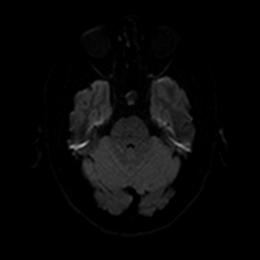
[im 35/94]
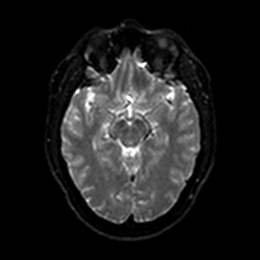
[im 47/94]
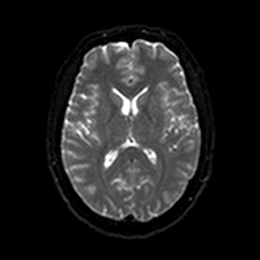
[im 59/94]
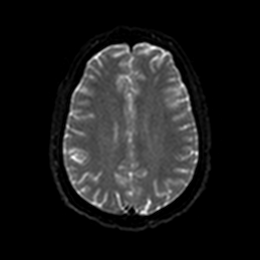
[im 70/94]
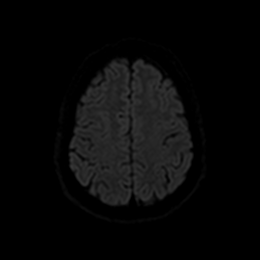
[im 82/94]
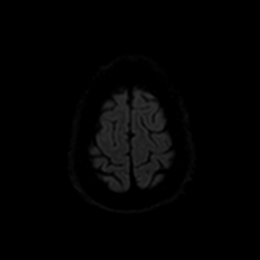
[im 94/94]
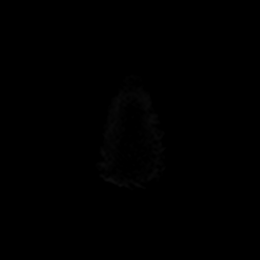

[Series 6: DWI · axial · 3.0mm · 0.92mm/px · z∈[-26,+111]mm · 5 of 47 slices shown (2 of 4)]
[im 1/47]
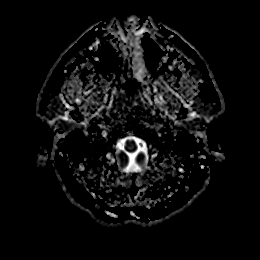
[im 12/47]
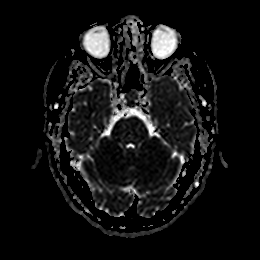
[im 24/47]
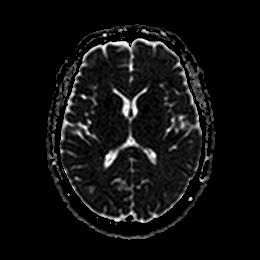
[im 35/47]
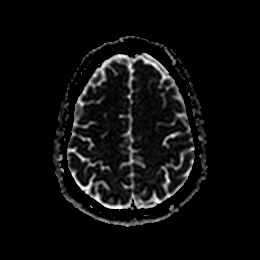
[im 47/47]
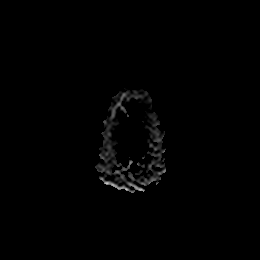

[Series 7: DWI · coronal · 4.0mm · 0.88mm/px · 5 of 64 slices shown (3 of 4)]
[im 1/64]
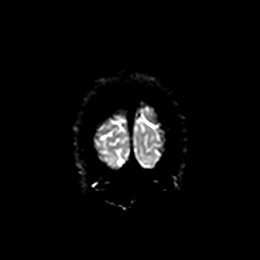
[im 16/64]
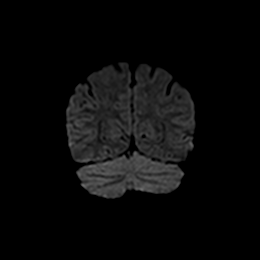
[im 32/64]
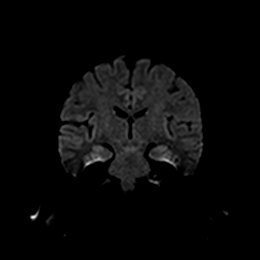
[im 48/64]
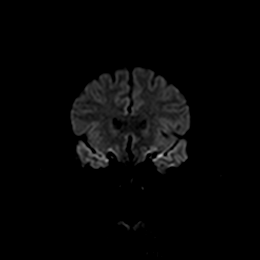
[im 64/64]
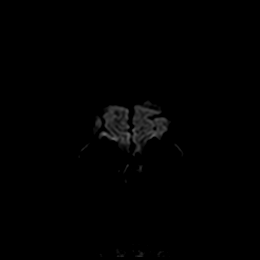

[Series 8: DWI · coronal · 4.0mm · 0.88mm/px · 3 of 32 slices shown (4 of 4)]
[im 1/32]
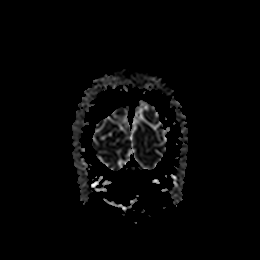
[im 16/32]
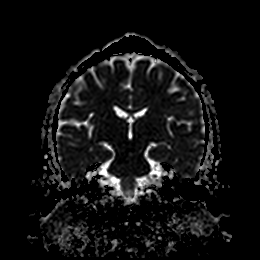
[im 32/32]
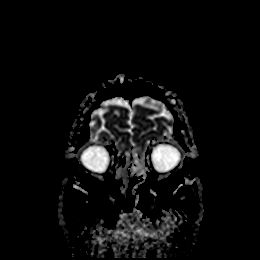

[Series 9: T1 · sagittal · 5.0mm · 0.75mm/px · 2 of 23 slices shown]
[im 1/23]
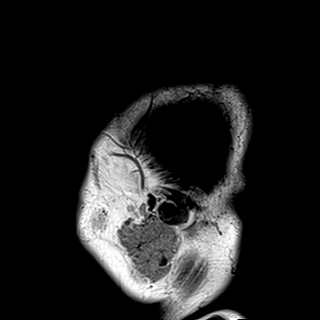
[im 23/23]
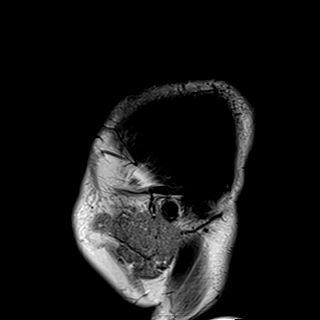

[Series 10: T2 · axial · 5.0mm · 0.72mm/px · z∈[-30,+114]mm · 2 of 25 slices shown]
[im 1/25]
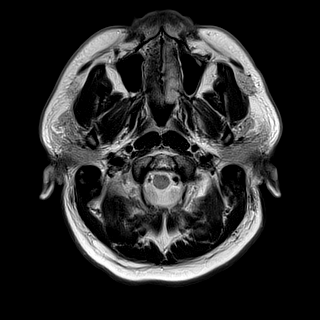
[im 25/25]
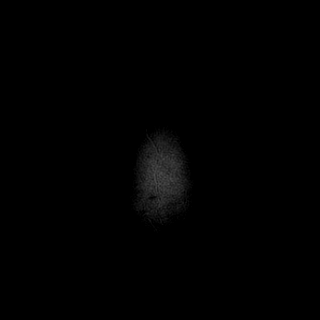

[Series 11: FLAIR · axial · 5.0mm · 0.45mm/px · z∈[-30,+113]mm · 2 of 25 slices shown]
[im 1/25]
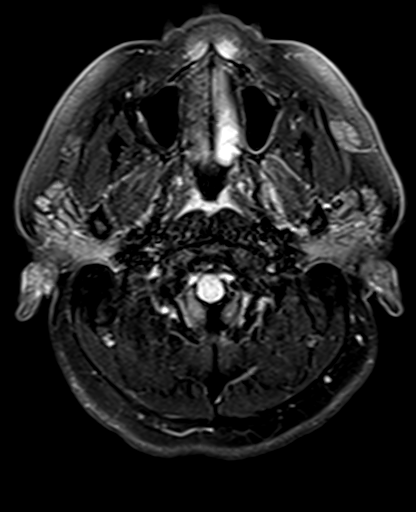
[im 25/25]
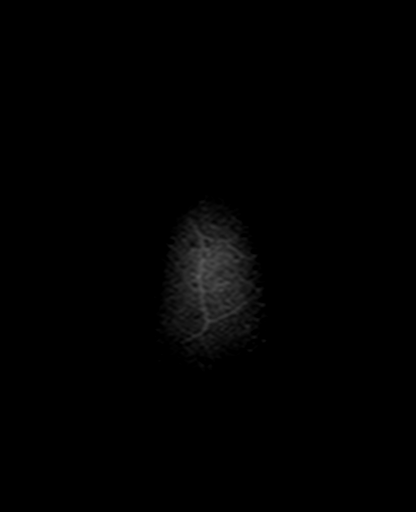

[Series 12: swi_images · axial · 3.0mm · 0.90mm/px · z∈[-32,+121]mm · 4 of 52 slices shown]
[im 1/52]
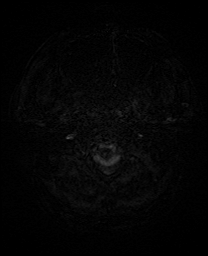
[im 18/52]
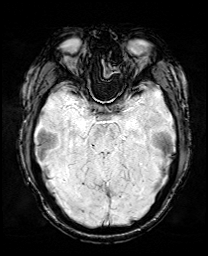
[im 35/52]
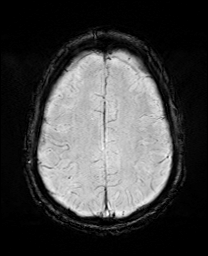
[im 52/52]
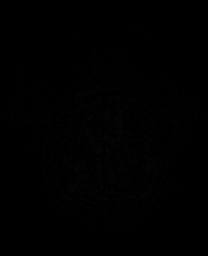

[Series 13: mip_images(sw) · axial · 24.0mm · 0.90mm/px · z∈[-22,+110]mm · 4 of 45 slices shown]
[im 1/45]
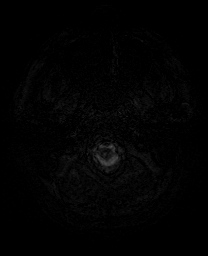
[im 15/45]
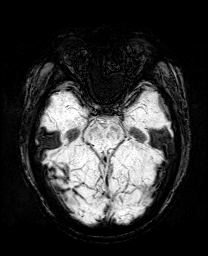
[im 30/45]
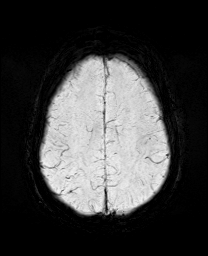
[im 45/45]
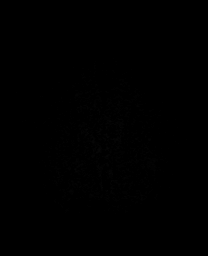

[Series 15: T2 post-contrast · coronal · 5.0mm · 0.72mm/px · 2 of 29 slices shown]
[im 1/29]
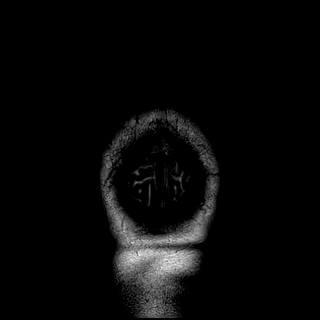
[im 29/29]
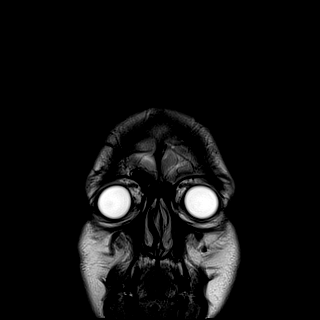

[Series 17: T1 post-contrast · coronal · 5.0mm · 0.34mm/px · 2 of 29 slices shown]
[im 1/29]
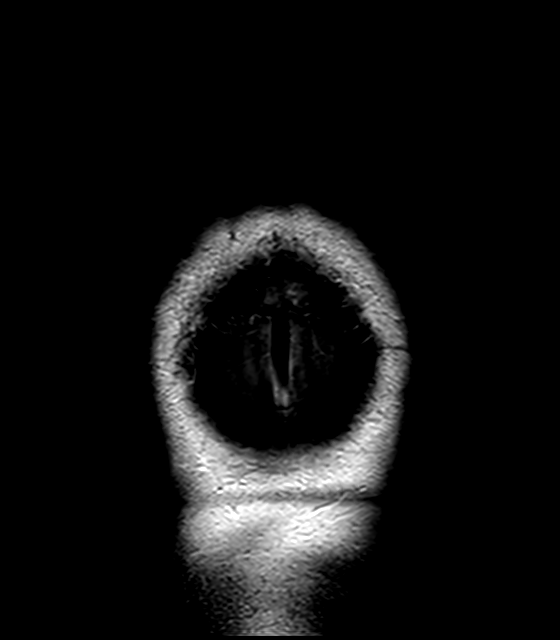
[im 29/29]
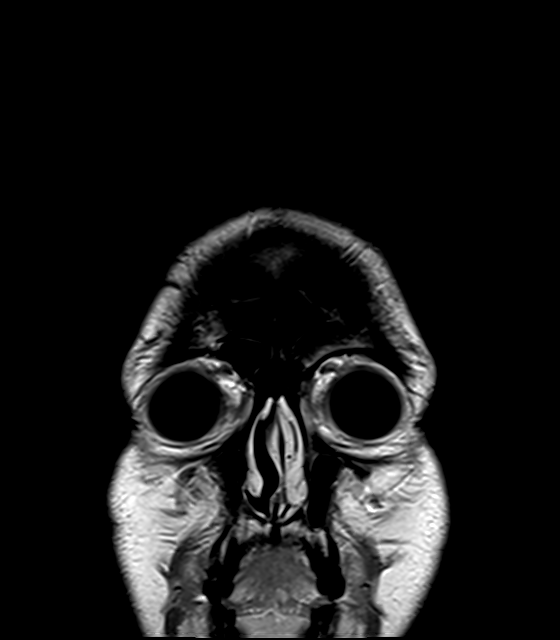

[40 of 48 positions shown; findings below may reference images not displayed]

FINDINGS: BRAIN: There is no acute infarct, acute hemorrhage, hydrocephalus or
extra-axial collection. The midline structures are normal. No
midline shift or other mass effect. There are no old infarcts. The
white matter signal is normal for the patient's age. The cerebral
and cerebellar volume are age-appropriate. Susceptibility-sensitive
sequences show no chronic microhemorrhage or superficial siderosis.
No abnormal contrast enhancement.

VASCULAR: Major intracranial arterial and venous sinus flow voids
are normal.

SKULL AND UPPER CERVICAL SPINE: Calvarial bone marrow signal is
normal. There is no skull base mass. Visualized upper cervical spine
and soft tissues are normal.

SINUSES/ORBITS: No fluid levels or advanced mucosal thickening. No
mastoid or middle ear effusion. The orbits are normal.
IMPRESSION: Normal brain MRI.

## 2020-02-18 IMAGING — CT CT HEAD W/O CM
4 series · 17 of 47 positions shown, 19 images · non-contrast
Comparison: None.

CLINICAL DATA: Headache.

EXAM:
CT HEAD WITHOUT CONTRAST
TECHNIQUE: Contiguous axial images were obtained from the base of the skull
through the vertex without intravenous contrast.

[Series 3: head without · axial · non-contrast · 0.43mm/px · z∈[-148,-23]mm · 7 of 35 slices shown, 9 images]
[im 5/35  brain]
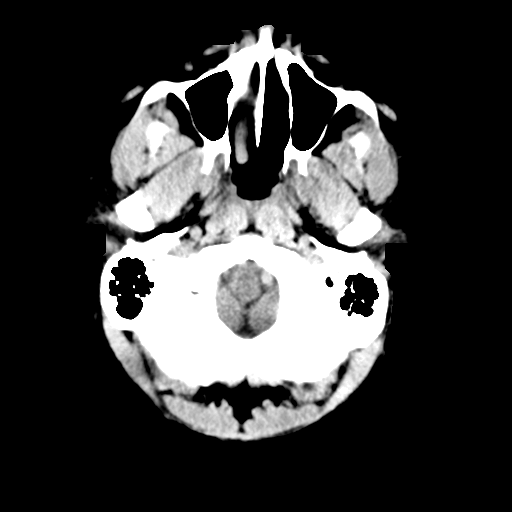
[im 5/35  bone]
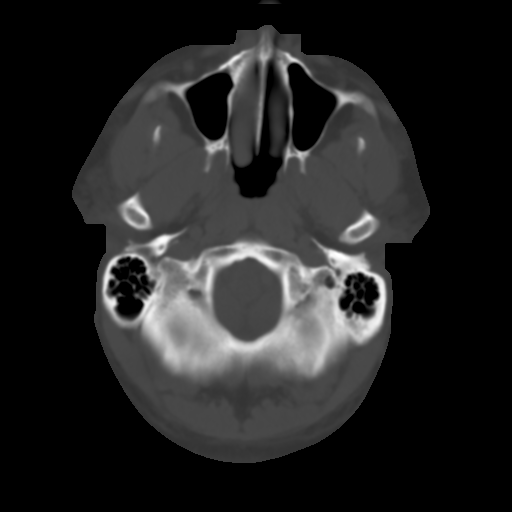
[im 9/35  brain]
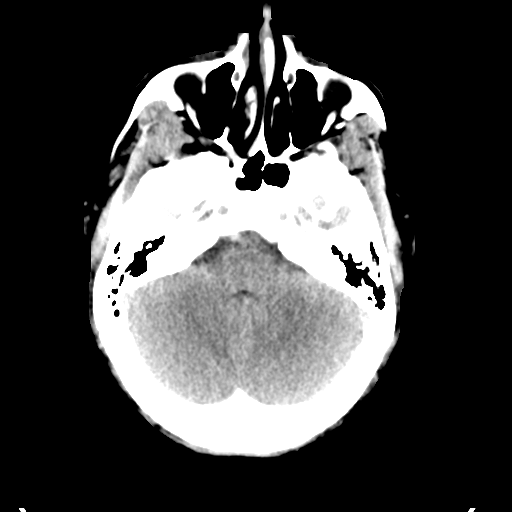
[im 13/35  brain]
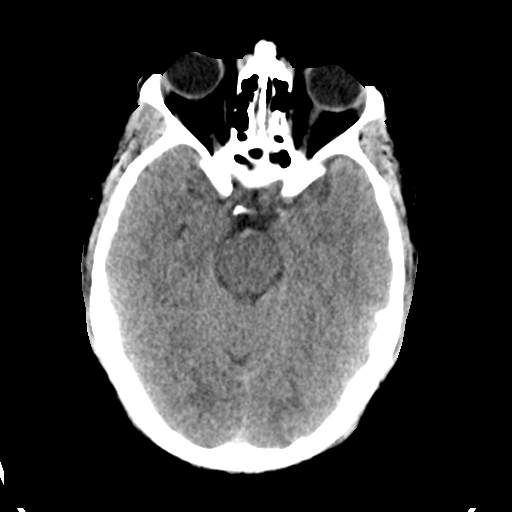
[im 18/35  brain]
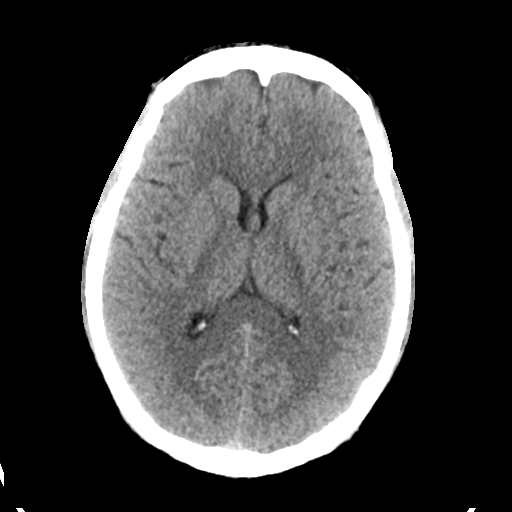
[im 22/35  brain]
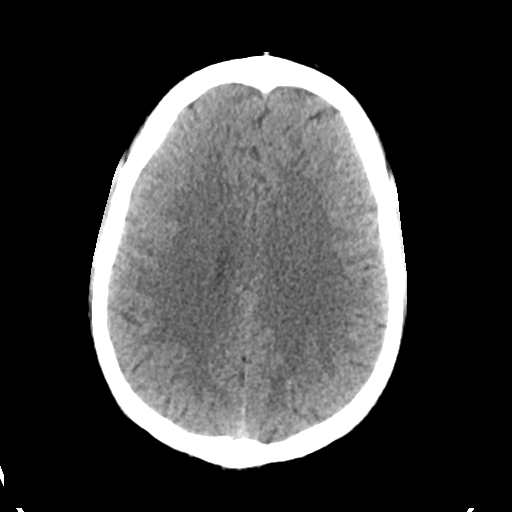
[im 22/35  bone]
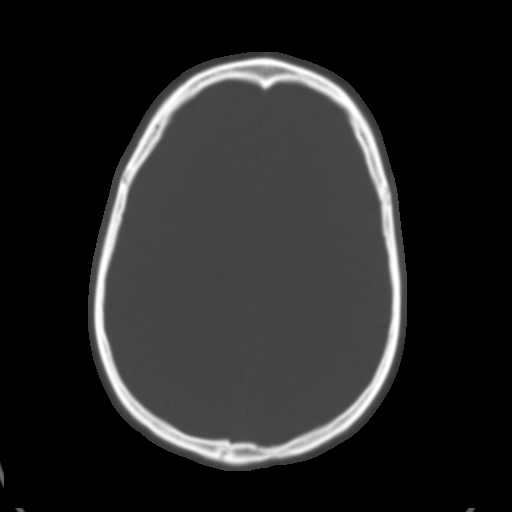
[im 26/35  brain]
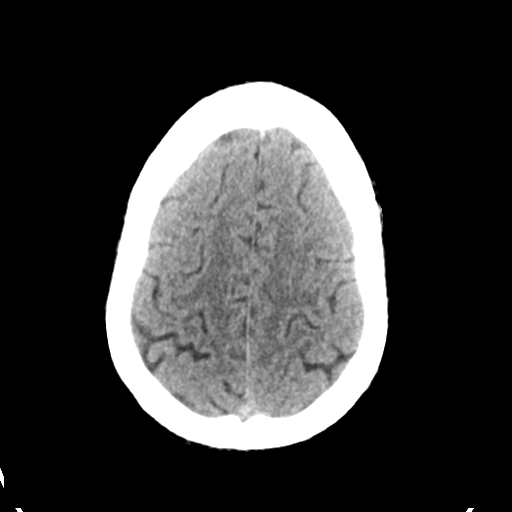
[im 30/35  brain]
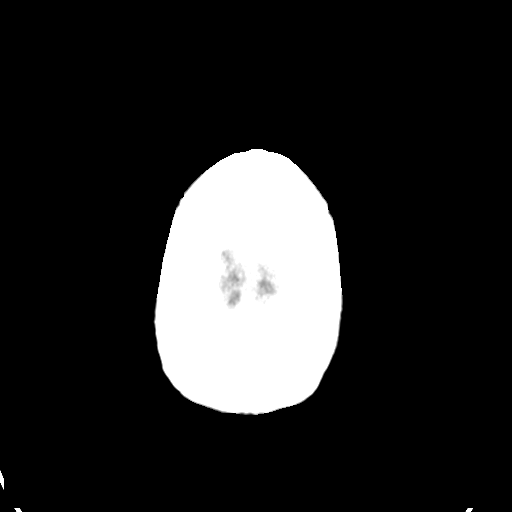

[Series 4: head bone · axial · 0.43mm/px · z∈[-152,-92]mm · 4 of 87 slices shown]
[im 9/87  bone]
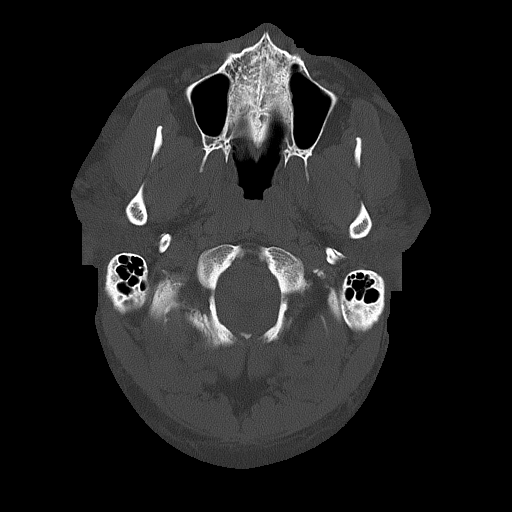
[im 18/87  bone]
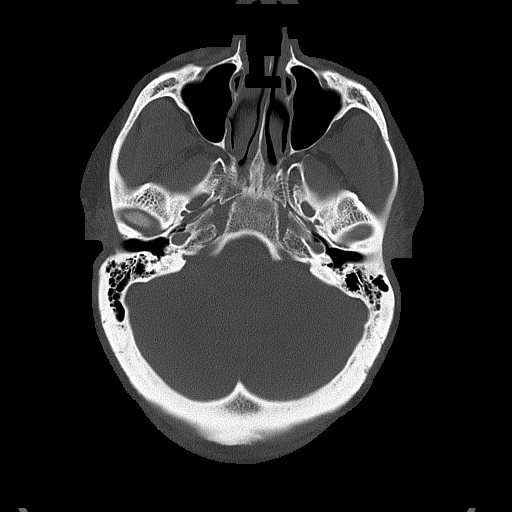
[im 26/87  bone]
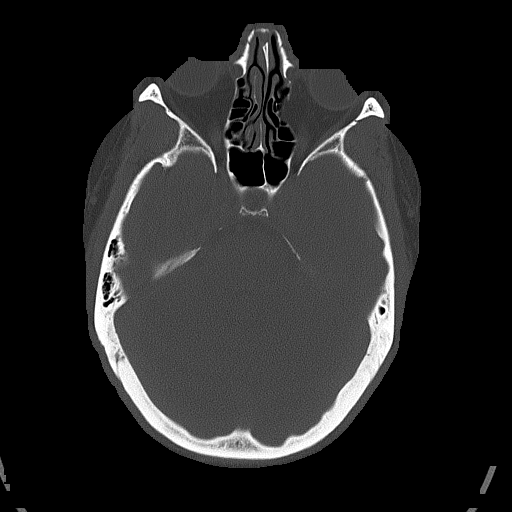
[im 39/87  bone]
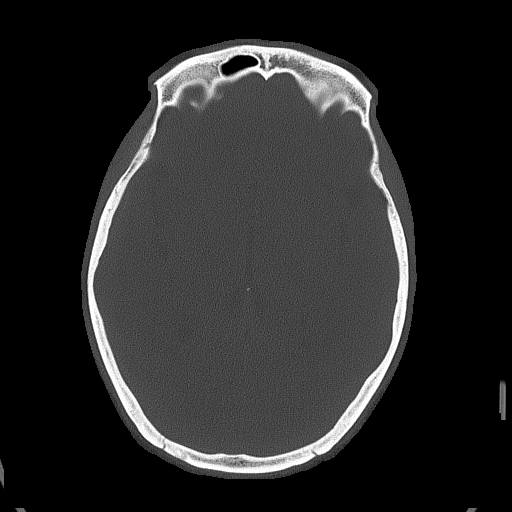

[Series 5: head without cor · coronal · non-contrast · 0.33mm/px · 3 of 72 slices shown]
[im 24/72  brain]
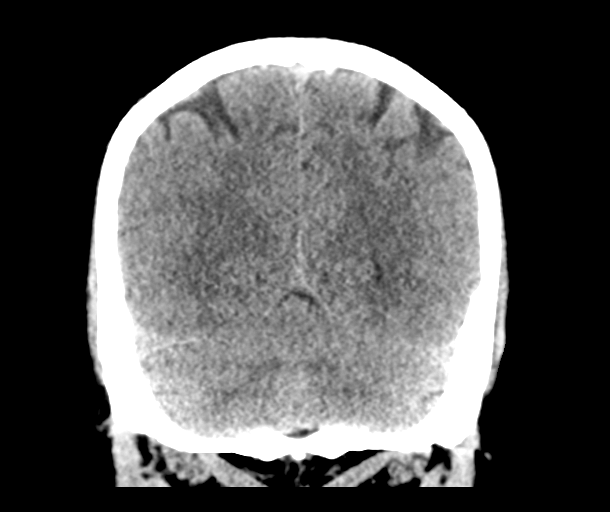
[im 32/72  brain]
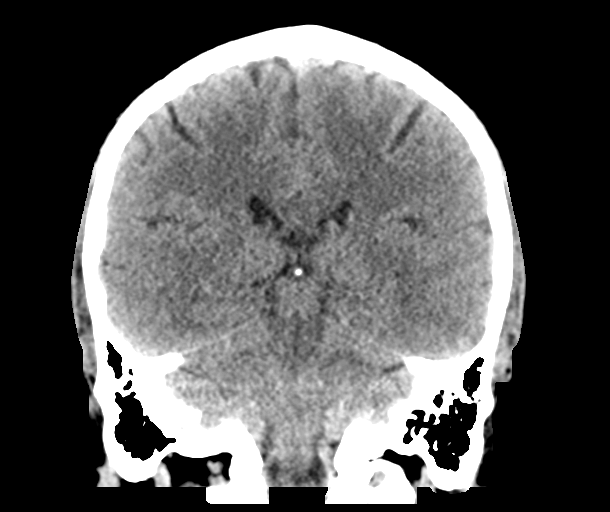
[im 40/72  brain]
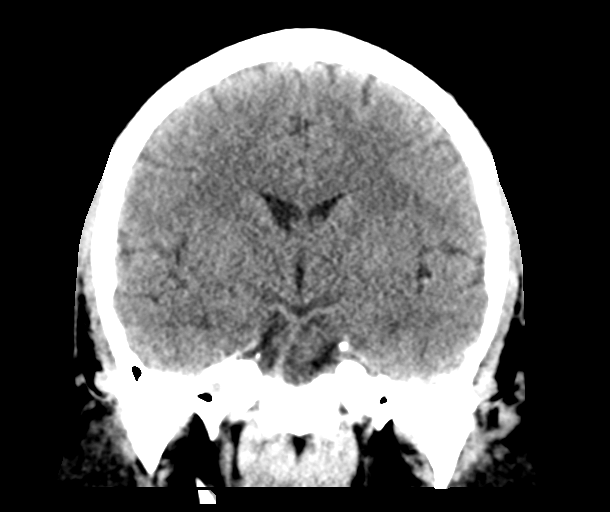

[Series 6: head without sag · sagittal · non-contrast · 0.35mm/px · 3 of 56 slices shown]
[im 19/56  brain]
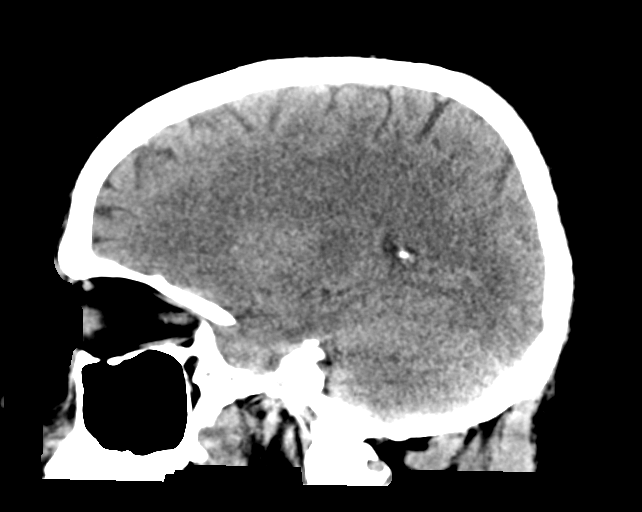
[im 28/56  brain]
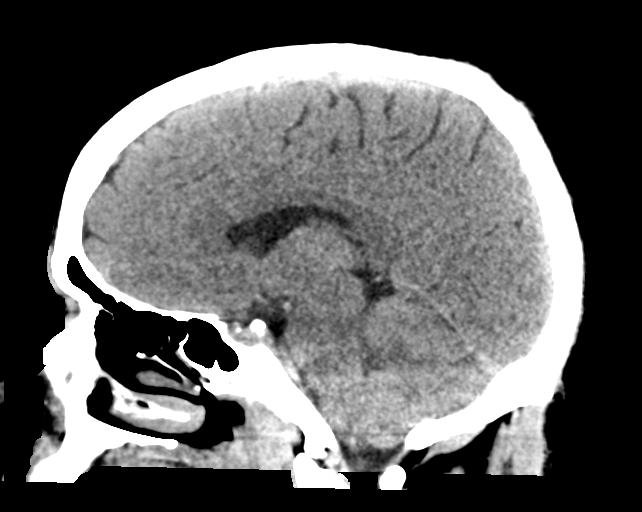
[im 37/56  brain]
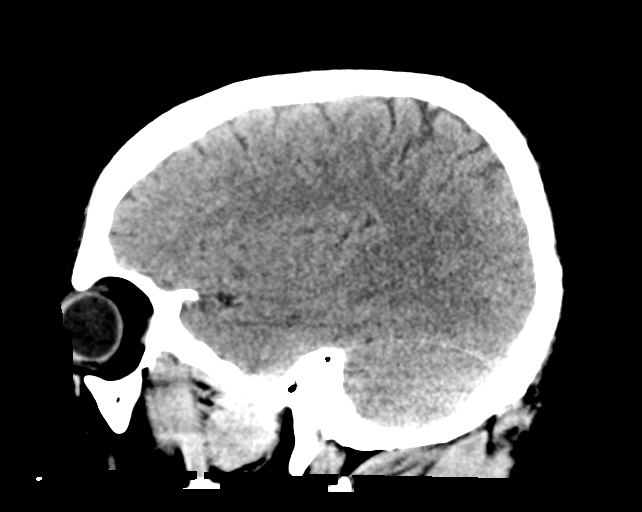

[17 of 47 positions shown; findings below may reference images not displayed]

FINDINGS: Brain: No evidence of acute infarction, hemorrhage, hydrocephalus,
extra-axial collection or mass lesion/mass effect.

Vascular: No hyperdense vessel or unexpected calcification.

Skull: Normal. Negative for fracture or focal lesion.

Sinuses/Orbits: No acute finding.

Other: None.
IMPRESSION: Normal head CT.

## 2020-02-21 ENCOUNTER — Other Ambulatory Visit: Payer: Self-pay

## 2020-02-21 ENCOUNTER — Encounter: Payer: Self-pay | Admitting: Endocrinology

## 2020-02-21 ENCOUNTER — Other Ambulatory Visit: Payer: Self-pay | Admitting: Endocrinology

## 2020-02-21 DIAGNOSIS — E1065 Type 1 diabetes mellitus with hyperglycemia: Secondary | ICD-10-CM

## 2020-02-21 MED ORDER — DEXCOM G6 TRANSMITTER MISC: 1.0000 | 2 refills | Status: DC

## 2020-04-09 DIAGNOSIS — M7989 Other specified soft tissue disorders: Secondary | ICD-10-CM | POA: Diagnosis not present

## 2020-04-19 ENCOUNTER — Other Ambulatory Visit: Payer: Self-pay

## 2020-04-19 ENCOUNTER — Ambulatory Visit: Payer: BC Managed Care – PPO | Admitting: Endocrinology

## 2020-04-19 ENCOUNTER — Encounter: Payer: Self-pay | Admitting: Endocrinology

## 2020-04-19 VITALS — BP 102/60 | HR 79 | Ht 69.0 in | Wt 246.2 lb

## 2020-04-19 DIAGNOSIS — E1065 Type 1 diabetes mellitus with hyperglycemia: Secondary | ICD-10-CM | POA: Diagnosis not present

## 2020-04-19 DIAGNOSIS — E109 Type 1 diabetes mellitus without complications: Secondary | ICD-10-CM

## 2020-04-19 LAB — POCT GLYCOSYLATED HEMOGLOBIN (HGB A1C): Hemoglobin A1C: 7.1 % — AB (ref 4.0–5.6)

## 2020-04-19 MED ORDER — INSULIN ASPART 100 UNIT/ML ~~LOC~~ SOLN: SUBCUTANEOUS | 3 refills | Status: DC

## 2020-04-19 MED ORDER — TRESIBA FLEXTOUCH 200 UNIT/ML ~~LOC~~ SOPN: 70.0000 [IU] | PEN_INJECTOR | Freq: Every day | SUBCUTANEOUS | 3 refills | Status: DC

## 2020-04-19 NOTE — Patient Instructions (Addendum)
Please decrease the Tresiba to 70 units daily, and:  Increase the Novolog to 18-22 units with breakfast, and 22-26 units with lunch and supper.   Please come back for a follow-up appointment in 3 months.

## 2020-04-19 NOTE — Progress Notes (Signed)
Subjective:    Patient ID: Andre Brown, male    DOB: 1981/07/27, 39 y.o.   MRN: [id number]  HPI Pt returns for f/u of diabetes mellitus: DM type: 1 Dx'ed: 1999 Complications: none Therapy: insulin since 2002. DKA: once (2002) Severe hypoglycemia: last episode was 2017.  Pancreatitis: once (2016) Pancreatic imaging: never.   Other: he has dexcom continuous glucose monitor; he took pump rx for a few mos in 2020; he takes multiple daily injections Interval history: I reviewed continuous glucose monitor data.  Glucose varies from 65-290.  It is in general highest after lunch and supper.   Past Medical History:  Diagnosis Date  . Diabetes mellitus without complication ([facility])   . Thyroid disease     No past surgical history on file.  Social History   Socioeconomic History  . Marital status: Married    Spouse name: Not on file  . Number of children: Not on file  . Years of education: Not on file  . Highest education level: Not on file  Occupational History  . Not on file  Tobacco Use  . Smoking status: Never Smoker  . Smokeless tobacco: Never Used  Substance and Sexual Activity  . Alcohol use: Yes  . Drug use: No  . Sexual activity: Not on file  Other Topics Concern  . Not on file  Social History Narrative  . Not on file   Social Determinants of Health   Financial Resource Strain:   . Difficulty of Paying Living Expenses:   Food Insecurity:   . Worried About Programme researcher, broadcasting/film/video in the Last Year:   . Barista in the Last Year:   Transportation Needs:   . Freight forwarder (Medical):   Marland Kitchen Lack of Transportation (Non-Medical):   Physical Activity:   . Days of Exercise per Week:   . Minutes of Exercise per Session:   Stress:   . Feeling of Stress :   Social Connections:   . Frequency of Communication with Friends and Family:   . Frequency of Social Gatherings with Friends and Family:   . Attends Religious Services:   . Active Member of Clubs or  Organizations:   . Attends Banker Meetings:   Marland Kitchen Marital Status:   Intimate Partner Violence:   . Fear of Current or Ex-Partner:   . Emotionally Abused:   Marland Kitchen Physically Abused:   . Sexually Abused:     Current Outpatient Medications on File Prior to Visit  Medication Sig Dispense Refill  . albuterol (PROVENTIL HFA;VENTOLIN HFA) 108 (90 Base) MCG/ACT inhaler Inhale 1-2 puffs into the lungs every 4 (four) hours as needed for wheezing or shortness of breath. 1 Inhaler 0  . B-D ULTRAFINE III SHORT PEN 31G X 8 MM MISC USE AS DIRECTED 5 TIMES A DAY 200 each 2  . Continuous Blood Gluc Sensor (DEXCOM G6 SENSOR) MISC 3 each by Other route See admin instructions. Apply one sensor to body once every 10 days; E11.9 9 each 0  . Continuous Blood Gluc Transmit (DEXCOM G6 TRANSMITTER) MISC 1 each by Other route See admin instructions. Use one transmitter once every 90 days. 1 each 2  . glucagon (GLUCAGON EMERGENCY) 1 MG injection Inject 1 mg into the muscle once as needed for up to 1 dose. 1 each 12  . ketoconazole (NIZORAL) 2 % cream Apply 1 application topically daily. 15 g 0  . ketotifen (ZADITOR) 0.025 % ophthalmic solution Place 1 drop  into both eyes 2 (two) times daily. 10 mL 0  . levothyroxine (SYNTHROID) 150 MCG tablet Take 1 tablet (150 mcg total) by mouth daily. 90 tablet 3  . sildenafil (VIAGRA) 100 MG tablet Take 0.5-1 tablets (50-100 mg total) by mouth daily as needed for erectile dysfunction. 10 tablet 11  . triamcinolone cream (KENALOG) 0.1 % Apply 1 application topically 2 (two) times daily. 30 g 0   No current facility-administered medications on file prior to visit.    No Known Allergies  Family History  Problem Relation Age of Onset  . Diabetes Neg Hx     BP (!) 102/60   Pulse 79   Ht 5\' 9"  (1.753 m)   Wt (!) 246 lb 3.2 oz (111.7 kg)   SpO2 95%   BMI 36.36 kg/m    Review of Systems Denies LOC.  He has gained weight.      Objective:   Physical Exam VITAL  SIGNS:  See vs page GENERAL: no distress Pulses: dorsalis pedis intact bilat.   MSK: no deformity of the feet CV: trace bilat leg edema.   Skin:  no ulcer on the feet.  normal color and temp on the feet.  Neuro: sensation is intact to touch on the feet.    A1c=7.1%     Assessment & Plan:  Type 1 DM: The pattern of his cbg's indicates he needs some adjustment in his therapy Hypoglycemia, due to insulin: this limits aggressiveness of glycemic control   Patient Instructions  Please decrease the Tresiba to 70 units daily, and:  Increase the Novolog to 18-22 units with breakfast, and 22-26 units with lunch and supper.   Please come back for a follow-up appointment in 3 months.

## 2020-04-25 DIAGNOSIS — R600 Localized edema: Secondary | ICD-10-CM | POA: Diagnosis not present

## 2020-05-16 ENCOUNTER — Other Ambulatory Visit: Payer: Self-pay | Admitting: Endocrinology

## 2020-05-29 ENCOUNTER — Other Ambulatory Visit: Payer: Self-pay

## 2020-05-29 ENCOUNTER — Other Ambulatory Visit: Payer: Self-pay | Admitting: Endocrinology

## 2020-05-29 ENCOUNTER — Encounter: Payer: Self-pay | Admitting: Endocrinology

## 2020-05-29 DIAGNOSIS — E1065 Type 1 diabetes mellitus with hyperglycemia: Secondary | ICD-10-CM

## 2020-07-25 ENCOUNTER — Ambulatory Visit: Payer: BC Managed Care – PPO | Admitting: Endocrinology

## 2020-07-25 ENCOUNTER — Other Ambulatory Visit: Payer: Self-pay

## 2020-07-25 ENCOUNTER — Encounter: Payer: Self-pay | Admitting: Endocrinology

## 2020-07-25 VITALS — BP 116/78 | HR 72 | Ht 69.0 in | Wt 250.0 lb

## 2020-07-25 DIAGNOSIS — E1065 Type 1 diabetes mellitus with hyperglycemia: Secondary | ICD-10-CM

## 2020-07-25 DIAGNOSIS — E109 Type 1 diabetes mellitus without complications: Secondary | ICD-10-CM

## 2020-07-25 LAB — T4, FREE: Free T4: 0.99 ng/dL (ref 0.60–1.60)

## 2020-07-25 LAB — BASIC METABOLIC PANEL
BUN: 10 mg/dL (ref 6–23)
CO2: 31 mEq/L (ref 19–32)
Calcium: 9.4 mg/dL (ref 8.4–10.5)
Chloride: 101 mEq/L (ref 96–112)
Creatinine, Ser: 1.18 mg/dL (ref 0.40–1.50)
GFR: 76.84 mL/min (ref >60.00)
Glucose, Bld: 90 mg/dL (ref 70–99)
Potassium: 4.4 mEq/L (ref 3.5–5.1)
Sodium: 137 mEq/L (ref 135–145)

## 2020-07-25 LAB — POCT GLYCOSYLATED HEMOGLOBIN (HGB A1C): Hemoglobin A1C: 7.2 % — AB (ref 4.0–5.6)

## 2020-07-25 LAB — MICROALBUMIN / CREATININE URINE RATIO
Creatinine,U: 191.4 mg/dL
Microalb Creat Ratio: 0.4 mg/g (ref 0.0–30.0)
Microalb, Ur: <0.7 mg/dL (ref 0.0–1.9)

## 2020-07-25 LAB — TSH: TSH: 4.3 u[IU]/mL (ref 0.35–4.50)

## 2020-07-25 MED ORDER — INSULIN ASPART 100 UNIT/ML ~~LOC~~ SOLN: SUBCUTANEOUS | 3 refills | Status: DC

## 2020-07-25 MED ORDER — TRESIBA FLEXTOUCH 200 UNIT/ML ~~LOC~~ SOPN: 65.0000 [IU] | PEN_INJECTOR | Freq: Every day | SUBCUTANEOUS | Status: DC

## 2020-07-25 NOTE — Patient Instructions (Addendum)
Please see Dr Nehemiah Settle, to follow up the foot swelling and pain.  Please decrease the Tresiba to 65 units daily, and:  Increase the Novolog to 18-22 units with breakfast, and 22-26 units with lunch and 27-31 with supper.   Blood and urine tests are requested for you today.  We'll let you know about the results.  Please come back for a follow-up appointment in 3 months.

## 2020-07-25 NOTE — Progress Notes (Signed)
Subjective:    Patient ID: Andre Brown, male    DOB: 1981-04-04, 39 y.o.   MRN: [id number]  HPI Pt returns for f/u of diabetes mellitus: DM type: 1 Dx'ed: 1999 Complications: none Therapy: insulin since 2002. DKA: once (2002) Severe hypoglycemia: last episode was 2017.  Pancreatitis: once (2016) Pancreatic imaging: never.   Other: he has dexcom continuous glucose monitor; he took pump rx for a few mos in 2020; he takes multiple daily injections.   Interval history: I reviewed continuous glucose monitor data.  Glucose varies from 66-340.  It is in general higher as the day goes on, but not necessarily so.  Main symptoms are pain and swelling of the legs.   Past Medical History:  Diagnosis Date  . Diabetes mellitus without complication ([facility])   . Thyroid disease     No past surgical history on file.  Social History   Socioeconomic History  . Marital status: Married    Spouse name: Not on file  . Number of children: Not on file  . Years of education: Not on file  . Highest education level: Not on file  Occupational History  . Not on file  Tobacco Use  . Smoking status: Never Smoker  . Smokeless tobacco: Never Used  Substance and Sexual Activity  . Alcohol use: Yes  . Drug use: No  . Sexual activity: Not on file  Other Topics Concern  . Not on file  Social History Narrative  . Not on file   Social Determinants of Health   Financial Resource Strain:   . Difficulty of Paying Living Expenses: Not on file  Food Insecurity:   . Worried About Programme researcher, broadcasting/film/video in the Last Year: Not on file  . Ran Out of Food in the Last Year: Not on file  Transportation Needs:   . Lack of Transportation (Medical): Not on file  . Lack of Transportation (Non-Medical): Not on file  Physical Activity:   . Days of Exercise per Week: Not on file  . Minutes of Exercise per Session: Not on file  Stress:   . Feeling of Stress : Not on file  Social Connections:   . Frequency of  Communication with Friends and Family: Not on file  . Frequency of Social Gatherings with Friends and Family: Not on file  . Attends Religious Services: Not on file  . Active Member of Clubs or Organizations: Not on file  . Attends Banker Meetings: Not on file  . Marital Status: Not on file  Intimate Partner Violence:   . Fear of Current or Ex-Partner: Not on file  . Emotionally Abused: Not on file  . Physically Abused: Not on file  . Sexually Abused: Not on file    Current Outpatient Medications on File Prior to Visit  Medication Sig Dispense Refill  . albuterol (PROVENTIL HFA;VENTOLIN HFA) 108 (90 Base) MCG/ACT inhaler Inhale 1-2 puffs into the lungs every 4 (four) hours as needed for wheezing or shortness of breath. 1 Inhaler 0  . Continuous Blood Gluc Sensor (DEXCOM G6 SENSOR) MISC 3 EACH BY OTHER ROUTE SEE ADMIN INSTRUCTIONS. APPLY ONE SENSOR TO BODY ONCE EVERY 10 DAYS E11.9 9 each 12  . Continuous Blood Gluc Transmit (DEXCOM G6 TRANSMITTER) MISC 1 each by Other route See admin instructions. Use one transmitter once every 90 days. 1 each 2  . glucagon (GLUCAGON EMERGENCY) 1 MG injection Inject 1 mg into the muscle once as needed for up to  1 dose. 1 each 12  . Insulin Pen Needle (B-D ULTRAFINE III SHORT PEN) 31G X 8 MM MISC 1 each by Other route 4 (four) times daily. E11.9 400 each 0  . ketoconazole (NIZORAL) 2 % cream Apply 1 application topically daily. 15 g 0  . ketotifen (ZADITOR) 0.025 % ophthalmic solution Place 1 drop into both eyes 2 (two) times daily. 10 mL 0  . levothyroxine (SYNTHROID) 150 MCG tablet Take 1 tablet (150 mcg total) by mouth daily. 90 tablet 3  . sildenafil (VIAGRA) 100 MG tablet Take 0.5-1 tablets (50-100 mg total) by mouth daily as needed for erectile dysfunction. 10 tablet 11  . triamcinolone cream (KENALOG) 0.1 % Apply 1 application topically 2 (two) times daily. 30 g 0   No current facility-administered medications on file prior to visit.     No Known Allergies  Family History  Problem Relation Age of Onset  . Diabetes Neg Hx     BP 116/78   Pulse 72   Ht 5\' 9"  (1.753 m)   Wt 250 lb (113.4 kg)   SpO2 98%   BMI 36.92 kg/m    Review of Systems     Objective:   Physical Exam VITAL SIGNS:  See vs page GENERAL: no distress Pulses: dorsalis pedis intact bilat.   MSK: no deformity of the feet CV: 2+ bilat leg edema Skin:  no ulcer on the feet.  normal color and temp on the feet. Neuro: sensation is intact to touch on the feet.   Lab Results  Component Value Date   HGBA1C 7.2 (A) 07/25/2020   Lab Results  Component Value Date   CREATININE 1.06 11/14/2019   BUN 13 11/14/2019   NA 137 11/14/2019   K 3.9 11/14/2019   CL 101 11/14/2019   CO2 26 11/14/2019       Assessment & Plan:  Type 1 DM: The pattern of his cbg's indicates he needs some adjustment in his therapy.   Hypoglycemia, due to insulin: this limits aggressiveness of glycemic control   Patient Instructions  Please see Dr 11/16/2019, to follow up the foot swelling and pain.  Please decrease the Tresiba to 65 units daily, and:  Increase the Novolog to 18-22 units with breakfast, and 22-26 units with lunch and 27-31 with supper.   Blood and urine tests are requested for you today.  We'll let you know about the results.  Please come back for a follow-up appointment in 3 months.

## 2020-08-15 ENCOUNTER — Other Ambulatory Visit: Payer: Self-pay | Admitting: Endocrinology

## 2020-08-28 ENCOUNTER — Other Ambulatory Visit: Payer: Self-pay | Admitting: Endocrinology

## 2020-10-10 ENCOUNTER — Other Ambulatory Visit: Payer: Self-pay | Admitting: Endocrinology

## 2020-10-23 ENCOUNTER — Other Ambulatory Visit: Payer: Self-pay

## 2020-10-25 ENCOUNTER — Ambulatory Visit: Payer: BC Managed Care – PPO | Admitting: Endocrinology

## 2020-10-25 ENCOUNTER — Other Ambulatory Visit: Payer: Self-pay

## 2020-10-25 ENCOUNTER — Encounter: Payer: Self-pay | Admitting: Endocrinology

## 2020-10-25 VITALS — BP 130/80 | HR 79 | Ht 68.0 in | Wt 246.6 lb

## 2020-10-25 DIAGNOSIS — E1065 Type 1 diabetes mellitus with hyperglycemia: Secondary | ICD-10-CM | POA: Diagnosis not present

## 2020-10-25 LAB — POCT GLYCOSYLATED HEMOGLOBIN (HGB A1C): Hemoglobin A1C: 7.5 % — AB (ref 4.0–5.6)

## 2020-10-25 MED ORDER — TRESIBA FLEXTOUCH 100 UNIT/ML ~~LOC~~ SOPN
45.0000 [IU] | PEN_INJECTOR | Freq: Every day | SUBCUTANEOUS | 3 refills | Status: DC
Start: 2020-10-25 — End: 2021-01-24

## 2020-10-25 MED ORDER — NOVOLOG FLEXPEN 100 UNIT/ML ~~LOC~~ SOPN
17.0000 [IU] | PEN_INJECTOR | Freq: Three times a day (TID) | SUBCUTANEOUS | 1 refills | Status: DC
Start: 2020-10-25 — End: 2021-03-23

## 2020-10-25 NOTE — Progress Notes (Signed)
Subjective:    Patient ID: Andre Brown, male    DOB: [birth year], 40 y.o.   MRN: [id number]  HPI Pt returns for f/u of diabetes mellitus:  DM type: 1 Dx'ed: 1999 Complications: none Therapy: insulin since 2002.  DKA: once (2002) Severe hypoglycemia: last episode was 2017.  Pancreatitis: once (2016) Pancreatic imaging: never.   Other: he has dexcom continuous glucose monitor; he took pump rx for a few mos in 2020; he takes multiple daily injections; he takes Novolog based on CHO.    Interval history: I reviewed continuous glucose monitor data.  Glucose varies from 55-280.  It is in general lowest at 3AM, and higher PC than AC.  pt states he feels well in general.  He is uncertain how much [place] he takes, but believes it to be 50 units qd.  He misses 1 dose per 4 days.    Past Medical History:  Diagnosis Date  . Diabetes mellitus without complication (HCC)   . Thyroid disease     No past surgical history on file.  Social History   Socioeconomic History  . Marital status: Married    Spouse name: Not on file  . Number of children: Not on file  . Years of education: Not on file  . Highest education level: Not on file  Occupational History  . Not on file  Tobacco Use  . Smoking status: Never Smoker  . Smokeless tobacco: Never Used  Substance and Sexual Activity  . Alcohol use: Yes  . Drug use: No  . Sexual activity: Not on file  Other Topics Concern  . Not on file  Social History Narrative  . Not on file   Social Determinants of Health   Financial Resource Strain: Not on file  Food Insecurity: Not on file  Transportation Needs: Not on file  Physical Activity: Not on file  Stress: Not on file  Social Connections: Not on file  Intimate Partner Violence: Not on file    Current Outpatient Medications on File Prior to Visit  Medication Sig Dispense Refill  . albuterol (PROVENTIL HFA;VENTOLIN HFA) 108 (90 Base) MCG/ACT inhaler Inhale 1-2 puffs into the lungs  every 4 (four) hours as needed for wheezing or shortness of breath. 1 Inhaler 0  . B-D ULTRAFINE III SHORT PEN 31G X 8 MM MISC USE 4 TIMES A DAY 400 each 0  . Continuous Blood Gluc Sensor (DEXCOM G6 SENSOR) MISC 3 EACH BY OTHER ROUTE SEE ADMIN INSTRUCTIONS. APPLY ONE SENSOR TO BODY ONCE EVERY 10 DAYS E11.9 9 each 12  . Continuous Blood Gluc Transmit (DEXCOM G6 TRANSMITTER) MISC 1 each by Other route See admin instructions. Use one transmitter once every 90 days. 1 each 2  . glucagon (GLUCAGON EMERGENCY) 1 MG injection Inject 1 mg into the muscle once as needed for up to 1 dose. 1 each 12  . ketoconazole (NIZORAL) 2 % cream Apply 1 application topically daily. 15 g 0  . ketotifen (ZADITOR) 0.025 % ophthalmic solution Place 1 drop into both eyes 2 (two) times daily. 10 mL 0  . levothyroxine (SYNTHROID) 150 MCG tablet TAKE 1 TABLET BY MOUTH EVERY DAY 90 tablet 3  . sildenafil (VIAGRA) 100 MG tablet Take 0.5-1 tablets (50-100 mg total) by mouth daily as needed for erectile dysfunction. 10 tablet 11  . triamcinolone cream (KENALOG) 0.1 % Apply 1 application topically 2 (two) times daily. 30 g 0   No current facility-administered medications on file prior to visit.  No Known Allergies  Family History  Problem Relation Age of Onset  . Diabetes Neg Hx     BP 130/80 (BP Location: Right Arm, Patient Position: Sitting, Cuff Size: Large)   Pulse 79   Ht 5\' 8"  (1.727 m)   Wt 246 lb 9.6 oz (111.9 kg)   SpO2 96%   BMI 37.50 kg/m    Review of Systems     Objective:   Physical Exam VITAL SIGNS:  See vs page GENERAL: no distress Pulses: dorsalis pedis intact bilat.   MSK: no deformity of the feet CV: trace bilat leg edema Skin:  no ulcer on the feet.  normal color and temp on the feet. Neuro: sensation is intact to touch on the feet  Lab Results  Component Value Date   CREATININE 1.18 07/25/2020   BUN 10 07/25/2020   NA 137 07/25/2020   K 4.4 07/25/2020   CL 101 07/25/2020   CO2 31  07/25/2020   Lab Results  Component Value Date   HGBA1C 7.5 (A) 10/25/2020       Assessment & Plan:  Type 1 DM Hypoglycemia, due to insulin: this limits aggressiveness of glycemic control   Patient Instructions  Please change the insulins to the numbers listed below.     Please come back for a follow-up appointment in 3 months.

## 2020-10-25 NOTE — Patient Instructions (Signed)
Please change the insulins to the numbers listed below.   ?Please come back for a follow-up appointment in 3 months.   ?

## 2020-11-13 ENCOUNTER — Other Ambulatory Visit: Payer: Self-pay | Admitting: Endocrinology

## 2020-11-13 DIAGNOSIS — E1065 Type 1 diabetes mellitus with hyperglycemia: Secondary | ICD-10-CM

## 2020-11-14 ENCOUNTER — Other Ambulatory Visit: Payer: Self-pay | Admitting: Endocrinology

## 2020-11-14 ENCOUNTER — Encounter: Payer: Self-pay | Admitting: Endocrinology

## 2020-11-14 NOTE — Telephone Encounter (Signed)
FYI

## 2020-12-25 DIAGNOSIS — E039 Hypothyroidism, unspecified: Secondary | ICD-10-CM | POA: Diagnosis not present

## 2020-12-25 DIAGNOSIS — Z0000 Encounter for general adult medical examination without abnormal findings: Secondary | ICD-10-CM | POA: Diagnosis not present

## 2020-12-25 DIAGNOSIS — E109 Type 1 diabetes mellitus without complications: Secondary | ICD-10-CM | POA: Diagnosis not present

## 2020-12-25 DIAGNOSIS — E7800 Pure hypercholesterolemia, unspecified: Secondary | ICD-10-CM | POA: Diagnosis not present

## 2021-01-11 IMAGING — CR DG CHEST 2V
2 series · 2 of 2 positions shown · non-contrast
Comparison: October 29, 2018

CLINICAL DATA: Cough

EXAM:
CHEST - 2 VIEW

[w chest pa]
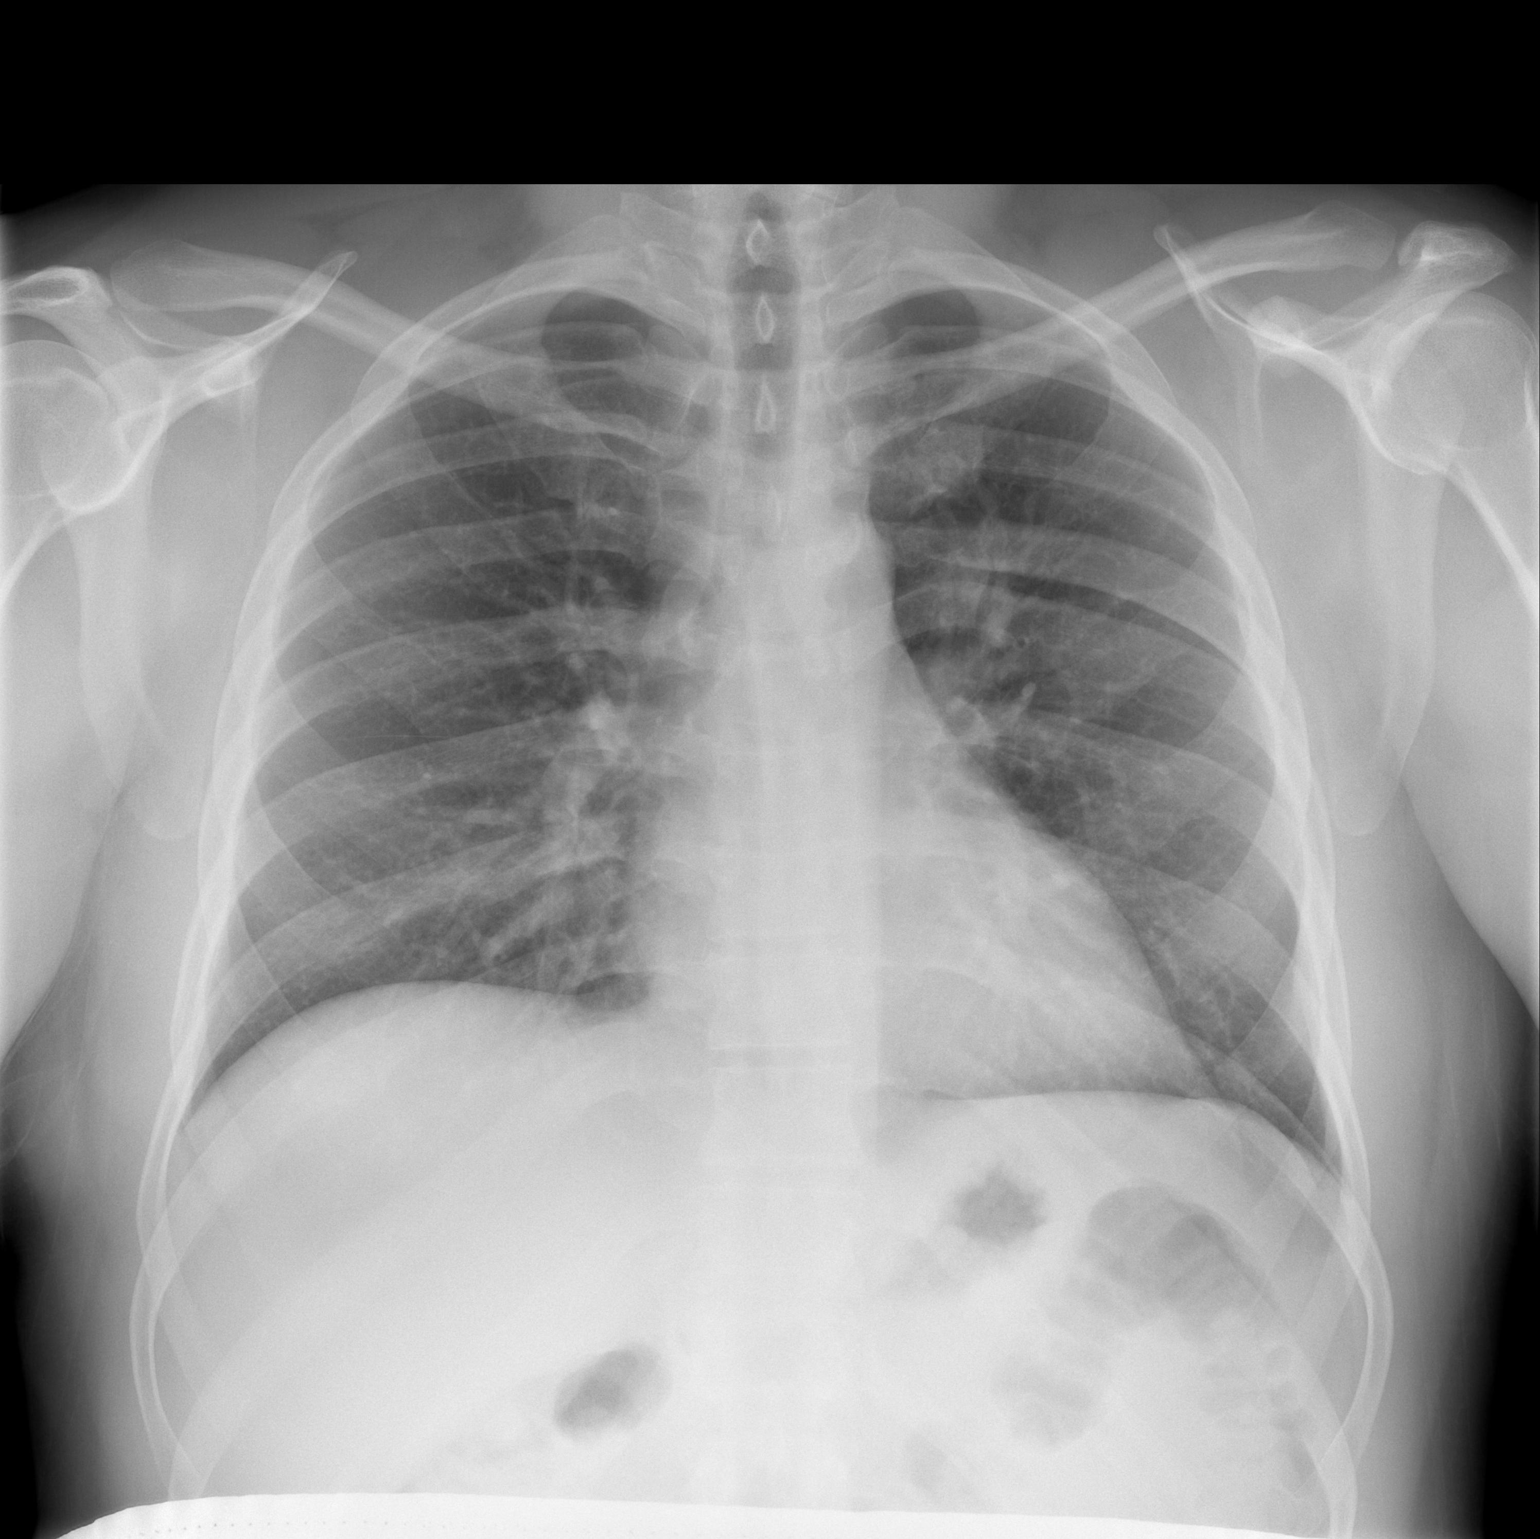

[w chest lat]
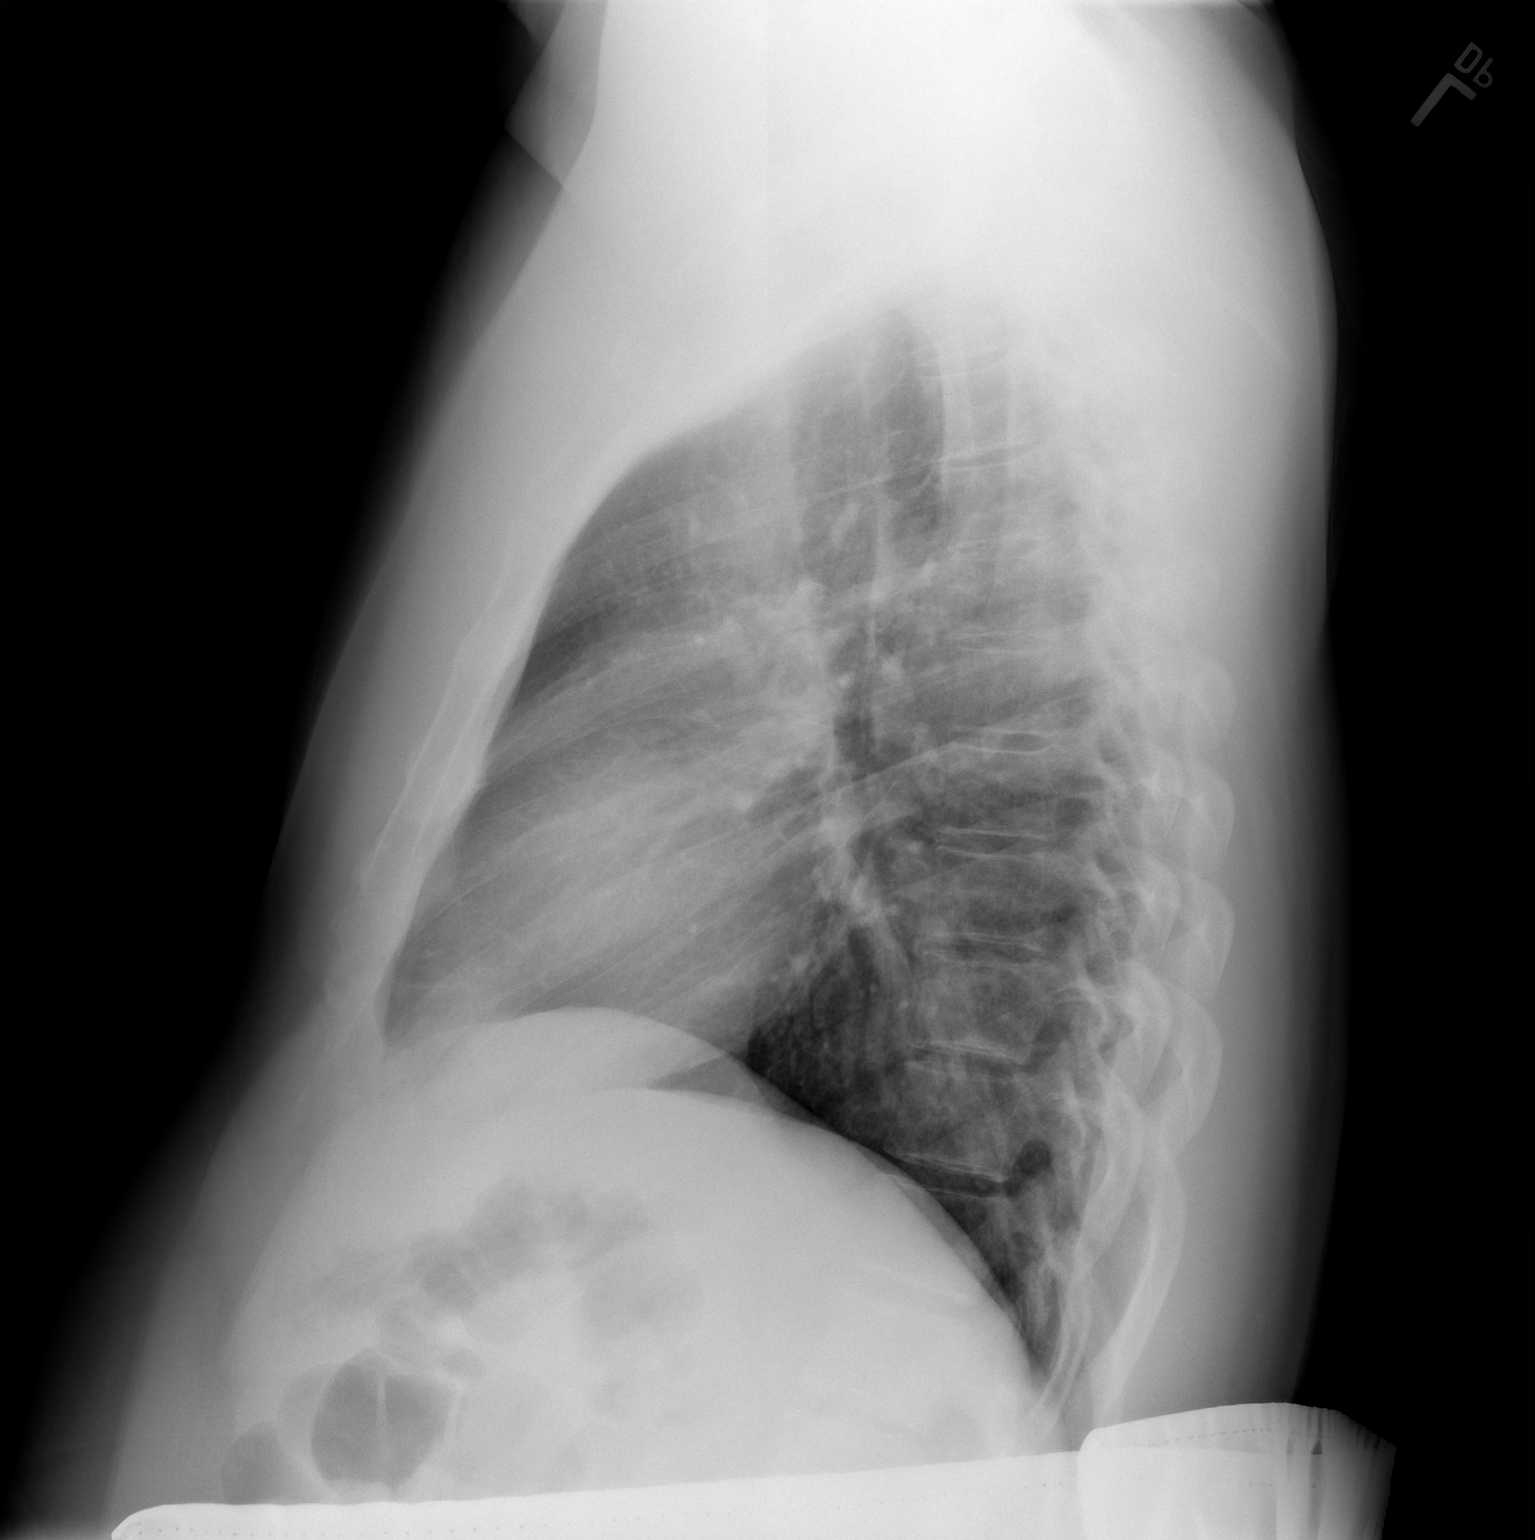

[2 of 2 positions shown; findings below may reference images not displayed]

FINDINGS: The heart size and mediastinal contours are within normal limits.
Both lungs are clear. No pleural effusion. The visualized skeletal
structures are unremarkable.
IMPRESSION: No acute process in the chest.

## 2021-01-24 ENCOUNTER — Other Ambulatory Visit: Payer: Self-pay

## 2021-01-24 ENCOUNTER — Ambulatory Visit: Payer: BC Managed Care – PPO | Admitting: Endocrinology

## 2021-01-24 VITALS — BP 120/80 | HR 71 | Ht 68.0 in | Wt 244.6 lb

## 2021-01-24 DIAGNOSIS — E1065 Type 1 diabetes mellitus with hyperglycemia: Secondary | ICD-10-CM

## 2021-01-24 DIAGNOSIS — R635 Abnormal weight gain: Secondary | ICD-10-CM | POA: Diagnosis not present

## 2021-01-24 LAB — POCT GLYCOSYLATED HEMOGLOBIN (HGB A1C): Hemoglobin A1C: 7.1 % — AB (ref 4.0–5.6)

## 2021-01-24 LAB — BASIC METABOLIC PANEL
BUN: 13 mg/dL (ref 6–23)
CO2: 30 mEq/L (ref 19–32)
Calcium: 9.5 mg/dL (ref 8.4–10.5)
Chloride: 102 mEq/L (ref 96–112)
Creatinine, Ser: 1.17 mg/dL (ref 0.40–1.50)
GFR: 77.36 mL/min (ref >60.00)
Glucose, Bld: 134 mg/dL — ABNORMAL HIGH (ref 70–99)
Potassium: 4.4 mEq/L (ref 3.5–5.1)
Sodium: 138 mEq/L (ref 135–145)

## 2021-01-24 MED ORDER — TRESIBA FLEXTOUCH 100 UNIT/ML ~~LOC~~ SOPN: 50.0000 [IU] | PEN_INJECTOR | Freq: Every day | SUBCUTANEOUS | 3 refills | Status: DC

## 2021-01-24 NOTE — Patient Instructions (Addendum)
Please continue the same insulins.  Please see a weight loss specialist.  you will receive a phone call, about a day and time for an appointment. Blood tests are requested for you today.  We'll let you know about the results.  Please come back for a follow-up appointment in 3 months.

## 2021-01-24 NOTE — Progress Notes (Signed)
Subjective:    Patient ID: Andre Brown, male    DOB: 1981/05/21, 40 y.o.   MRN: [id number]  HPI Pt returns for f/u of diabetes mellitus:  DM type: 1 Dx'ed: 1999 Complications: none Therapy: insulin since 2002.  DKA: once (2002) Severe hypoglycemia: last episode was 2017.  Pancreatitis: once (2016) Pancreatic imaging: never.   Other: he has dexcom continuous glucose monitor; he took pump rx for a few mos in 2020; he takes multiple daily injections; he takes Novolog based on CHO.    Interval history: I reviewed continuous glucose monitor data.  Glucose varies from 70-310.  It is in general highest at 4PM-8PM.  pt states he feels well in general.   Past Medical History:  Diagnosis Date  . Diabetes mellitus without complication (HCC)   . Thyroid disease     No past surgical history on file.  Social History   Socioeconomic History  . Marital status: Married    Spouse name: Not on file  . Number of children: Not on file  . Years of education: Not on file  . Highest education level: Not on file  Occupational History  . Not on file  Tobacco Use  . Smoking status: Never Smoker  . Smokeless tobacco: Never Used  Substance and Sexual Activity  . Alcohol use: Yes  . Drug use: No  . Sexual activity: Not on file  Other Topics Concern  . Not on file  Social History Narrative  . Not on file   Social Determinants of Health   Financial Resource Strain: Not on file  Food Insecurity: Not on file  Transportation Needs: Not on file  Physical Activity: Not on file  Stress: Not on file  Social Connections: Not on file  Intimate Partner Violence: Not on file    Current Outpatient Medications on File Prior to Visit  Medication Sig Dispense Refill  . albuterol (PROVENTIL HFA;VENTOLIN HFA) 108 (90 Base) MCG/ACT inhaler Inhale 1-2 puffs into the lungs every 4 (four) hours as needed for wheezing or shortness of breath. 1 Inhaler 0  . B-D ULTRAFINE III SHORT PEN 31G X 8 MM MISC  USE 4 TIMES A DAY 400 each 0  . Continuous Blood Gluc Sensor (DEXCOM G6 SENSOR) MISC 3 EACH BY OTHER ROUTE SEE ADMIN INSTRUCTIONS. APPLY ONE SENSOR TO BODY ONCE EVERY 10 DAYS E11.9 9 each 12  . Continuous Blood Gluc Transmit (DEXCOM G6 TRANSMITTER) MISC 1 EACH BY OTHER ROUTE SEE ADMIN INSTRUCTIONS. USE ONE TRANSMITTER ONCE EVERY 90 DAYS. 1 each 2  . glucagon (GLUCAGON EMERGENCY) 1 MG injection Inject 1 mg into the muscle once as needed for up to 1 dose. 1 each 12  . insulin aspart (NOVOLOG FLEXPEN) 100 UNIT/ML FlexPen Inject 17-26 Units into the skin 3 (three) times daily with meals. And pen needles 4/day 66 mL 1  . ketoconazole (NIZORAL) 2 % cream Apply 1 application topically daily. 15 g 0  . ketotifen (ZADITOR) 0.025 % ophthalmic solution Place 1 drop into both eyes 2 (two) times daily. 10 mL 0  . levothyroxine (SYNTHROID) 150 MCG tablet TAKE 1 TABLET BY MOUTH EVERY DAY 90 tablet 3  . sildenafil (VIAGRA) 100 MG tablet TAKE 0.5-1 TABLETS (50-100 MG TOTAL) DAILY AS NEEDED FOR ERECTILE DYSFUNCTION. (6 PER 25 DAYS) 6 tablet 3  . triamcinolone cream (KENALOG) 0.1 % Apply 1 application topically 2 (two) times daily. 30 g 0   No current facility-administered medications on file prior to visit.  No Known Allergies  Family History  Problem Relation Age of Onset  . Diabetes Neg Hx     BP 120/80 (BP Location: Right Arm, Patient Position: Sitting, Cuff Size: Large)   Pulse 71   Ht 5\' 8"  (1.727 m)   Wt 244 lb 9.6 oz (110.9 kg)   SpO2 97%   BMI 37.19 kg/m    Review of Systems     Objective:   Physical Exam VITAL SIGNS:  See vs page GENERAL: no distress Pulses: dorsalis pedis intact bilat.   MSK: no deformity of the feet CV: no leg edema Skin:  no ulcer on the feet.  normal color and temp on the feet. Neuro: sensation is intact to touch on the feet  A1c=7.1%  Lab Results  Component Value Date   TSH 4.30 07/25/2020   Lab Results  Component Value Date   CREATININE 1.18  07/25/2020   BUN 10 07/25/2020   NA 137 07/25/2020   K 4.4 07/25/2020   CL 101 07/25/2020   CO2 31 07/25/2020       Assessment & Plan:  Type 1 DM:  Uncontrolled.  I advised pt to reduce Tresiba, and increase humalog.  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 Obesity, persistent.    Patient Instructions  Please continue the same insulins.  Please see a weight loss specialist.  you will receive a phone call, about a day and time for an appointment. Blood tests are requested for you today.  We'll let you know about the results.  Please come back for a follow-up appointment in 3 months.

## 2021-02-06 ENCOUNTER — Ambulatory Visit (INDEPENDENT_AMBULATORY_CARE_PROVIDER_SITE_OTHER): Payer: BC Managed Care – PPO | Admitting: Family Medicine

## 2021-02-06 ENCOUNTER — Encounter (INDEPENDENT_AMBULATORY_CARE_PROVIDER_SITE_OTHER): Payer: Self-pay | Admitting: Family Medicine

## 2021-02-06 ENCOUNTER — Other Ambulatory Visit: Payer: Self-pay

## 2021-02-06 VITALS — BP 111/73 | HR 73 | Temp 98.5°F | Ht 68.0 in | Wt 238.0 lb

## 2021-02-06 DIAGNOSIS — Z0289 Encounter for other administrative examinations: Secondary | ICD-10-CM

## 2021-02-06 DIAGNOSIS — Z9189 Other specified personal risk factors, not elsewhere classified: Secondary | ICD-10-CM | POA: Diagnosis not present

## 2021-02-06 DIAGNOSIS — R0602 Shortness of breath: Secondary | ICD-10-CM | POA: Diagnosis not present

## 2021-02-06 DIAGNOSIS — Z1331 Encounter for screening for depression: Secondary | ICD-10-CM | POA: Diagnosis not present

## 2021-02-06 DIAGNOSIS — E1169 Type 2 diabetes mellitus with other specified complication: Secondary | ICD-10-CM

## 2021-02-06 DIAGNOSIS — E038 Other specified hypothyroidism: Secondary | ICD-10-CM

## 2021-02-06 DIAGNOSIS — E559 Vitamin D deficiency, unspecified: Secondary | ICD-10-CM | POA: Diagnosis not present

## 2021-02-06 DIAGNOSIS — R5383 Other fatigue: Secondary | ICD-10-CM | POA: Insufficient documentation

## 2021-02-06 DIAGNOSIS — Z794 Long term (current) use of insulin: Secondary | ICD-10-CM

## 2021-02-06 DIAGNOSIS — R609 Edema, unspecified: Secondary | ICD-10-CM | POA: Diagnosis not present

## 2021-02-06 DIAGNOSIS — E785 Hyperlipidemia, unspecified: Secondary | ICD-10-CM

## 2021-02-06 DIAGNOSIS — Z6836 Body mass index (BMI) 36.0-36.9, adult: Secondary | ICD-10-CM

## 2021-02-07 LAB — VITAMIN B12: Vitamin B-12: 422 pg/mL (ref 232–1245)

## 2021-02-07 LAB — FOLATE: Folate: 9.2 ng/mL (ref >3.0)

## 2021-02-07 LAB — T4, FREE: Free T4: 1.44 ng/dL (ref 0.82–1.77)

## 2021-02-07 LAB — TSH: TSH: 0.92 u[IU]/mL (ref 0.450–4.500)

## 2021-02-07 LAB — VITAMIN D 25 HYDROXY (VIT D DEFICIENCY, FRACTURES): Vit D, 25-Hydroxy: 37.4 ng/mL (ref 30.0–100.0)

## 2021-02-12 NOTE — Progress Notes (Signed)
Dear Dr. Everardo All,   Thank you for referring Andre Brown to our clinic. The following note includes my evaluation and treatment recommendations.  Chief Complaint:   OBESITY Andre Brown (MR# [id number]) is a 41 y.o. male who presents for evaluation and treatment of obesity and related comorbidities. Current BMI is Body mass index is 36.19 kg/m. Andre Brown has been struggling with his weight for many years and has been unsuccessful in either losing weight, maintaining weight loss, or reaching his healthy weight goal.  Andre Brown is currently in the action stage of change and ready to dedicate time achieving and maintaining a healthier weight. Andre Brown is interested in becoming our patient and working on intensive lifestyle modifications including (but not limited to) diet and exercise for weight loss.  Andre Brown lives with his wife, Andre Brown, and 51 and 29 year old daughters.  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  He wants to lose 80 pounds in 6 months.  Andre Brown, Andre Brown, Andre Brown.  Skips breakfast most days.  Snacks a lot, he says, on the above items and fruit.  Referred by Dr. Everardo All of Endocrinology.  His PCP is Dr. Nehemiah Settle [facility].  Andre Brown's habits were reviewed today and are as follows: His family eats meals together, he thinks his family will eat healthier with him, his desired weight loss is 77 pounds, he started gaining weight approximately 10 years ago, his heaviest weight ever was 247 pounds, he Andre chocolate, Andre Brown, and candy, he snacks frequently in the evenings, he skips breakfast frequently, he is frequently drinking liquids with calories, he frequently makes poor food choices, he has problems with excessive hunger, he frequently eats larger portions than normal and he struggles with emotional eating.  Depression Screen Andre Brown (modified PHQ-9) score was 9.  Depression screen [facility] 2/9 02/06/2021  Decreased Interest 2  Down,  Depressed, Hopeless 0  PHQ - 2 Score 2  Altered sleeping 1  Tired, decreased energy 3  Change in appetite 3  Feeling bad or failure about yourself  0  Trouble concentrating 0  Moving slowly or fidgety/restless 0  Suicidal thoughts 0  PHQ-9 Score 9  Difficult doing work/chores Not difficult at all   Assessment/Plan:   Orders Placed This Encounter  Procedures  . Vitamin B12  . Folate  . T4, free  . TSH  . VITAMIN D 25 Hydroxy (Vit-D Deficiency, Fractures)  . EKG 12-Lead    Medications Discontinued During This Encounter  Medication Reason  . albuterol (PROVENTIL HFA;VENTOLIN HFA) 108 (90 Base) MCG/ACT inhaler   . ketotifen (ZADITOR) 0.025 % ophthalmic solution     1. Other fatigue Andre Brown denies daytime somnolence and denies waking up still tired. Patent has a history of symptoms of snoring. Andre Brown generally gets 7 hours of sleep per night, and states that he has poor quality sleep. Snoring is not present. Apneic episodes are not present. Epworth Sleepiness Score is 1.  Andre Brown does feel that his weight is causing his energy to be lower than it should be. Fatigue may be related to obesity, depression or many other causes. Labs will be ordered, and in the meanwhile, Andre Brown will focus on self care including making healthy food choices, increasing physical activity and focusing on stress reduction.  Will check EKG and labs today.  - EKG 12-Lead - Vitamin B12 - Folate - T4, free - TSH - VITAMIN D 25 Hydroxy (Vit-D Deficiency, Fractures)  2. SOBOE (shortness of breath on  exertion) Andre Brown notes increasing shortness of breath with exercising and seems to be worsening over time with weight gain. He notes getting out of breath sooner with activity than he used to. This has gotten worse recently. Andre Brown denies shortness of breath at rest or orthopnea.  Andre Brown does feel that he gets out of breath more easily that he used to when he exercises. Andre Brown shortness of breath appears  to be obesity related and exercise induced. He has agreed to work on weight loss and gradually increase exercise to treat his exercise induced shortness of breath. Will continue to monitor closely.  Check IC today.  3. Type 2 diabetes mellitus with other specified complication, with long-term current use of insulin (HCC) Diabetes Mellitus: Not at goal. Medication: Novolog, Andre Bury. Issues reviewed: blood sugar goals, complications of diabetes mellitus, hypoglycemia prevention and treatment, exercise, and nutrition.  A1c 7.1 on 01/24/2021.  Management per Endocrinology.  Denies concerns.  Plan: The importance of regular follow up with PCP and all other specialists as scheduled was stressed to patient today. The patient will continue to focus on protein-rich, low simple carbohydrate foods. We reviewed the importance of hydration, regular exercise for stress reduction, and restorative sleep.   Lab Results  Component Value Date   HGBA1C 7.1 (A) 01/24/2021   HGBA1C 7.5 (A) 10/25/2020   HGBA1C 7.2 (A) 07/25/2020   Lab Results  Component Value Date   MICROALBUR <0.7 07/25/2020   CREATININE 1.17 01/24/2021   4. Hyperlipidemia associated with type 2 diabetes mellitus (HCC) Lipid-lowering medications: Crestor 10 mg daily.  Rarely takes Crestor.  States he does not need medication to control his cholesterol, but just needs to eat better.  Plan:  Advised to take statin nightly per PCP and we will recheck labs 3-4 months after prudent nutritional plan and weight loss.  Dietary changes: Increase soluble fiber, decrease simple carbohydrates, decrease saturated fat. Exercise changes: Moderate to vigorous-intensity aerobic activity 150 minutes per week or as tolerated. We will continue to monitor along with PCP/specialists as it pertains to his weight loss journey.  Lab Results  Component Value Date   ALT 23 09/22/2018   AST 32 09/22/2018   ALKPHOS 62 09/22/2018   BILITOT 1.4 (H) 09/22/2018   The  10-year ASCVD risk score Denman George DC Jr., et al., 2013) is: 2.5%   Values used to calculate the score:     Age: 55 years     Sex: Male     Is Non-Hispanic African American: No     Diabetic: Yes     Tobacco smoker: No     Systolic Blood Pressure: 116 mmHg     Is BP treated: Yes     HDL Cholesterol: 53 mg/dL     Total Cholesterol: 213 mg/dL  5. Peripheral edema Etai is taking Lasix 20 mg daily as needed for peripheral edema.  Plan:  Continue Lasix.  Decrease salt intake.  Follow prudent nutritional plan.  Eventually increase activity.  6. Other specified hypothyroidism Course: Stable. Medication: Synthroid 150 mcg daily.   Plan:  Continue Synthroid. Patient was instructed not to take MVM or iron within 4 hours of taking thyroid medications. This issue is managed by Dr. Everardo All. We will continue to monitor alongside Endocrinology/PCP as it relates to his weight loss journey.  Will check free T4 and TSH today.   Lab Results  Component Value Date   TSH 0.920 02/06/2021   - T4, free - TSH  7. Vitamin D deficiency He is  taking OTC vitamin D 5,000 IU 2-3 days per week on average. Optimal goal > 50 ng/dL.   Plan: Continue current OTC vitamin D supplementation.  Will check vitamin D level today, as per below.  - VITAMIN D 25 Hydroxy (Vit-D Deficiency, Fractures)  8. Depression screening Bayan was screened for depression as part of his new patient workup.  PHQ-9 is 9.  Chucky had a positive depression screening. Depression is commonly associated with obesity and often results in emotional eating behaviors. We will monitor this closely and work on CBT to help improve the non-hunger eating patterns. Referral to Psychology may be required if no improvement is seen as he continues in our clinic.  9. At risk for heart disease Due to Andre Brown's current state of health and medical condition(s), he is at a higher risk for heart disease.  This puts the patient at much greater risk to  subsequently develop cardiopulmonary conditions that can significantly affect patient's quality of life in a negative manner.    At least 9 minutes were spent on counseling Acheron about these concerns today, and I stressed the importance of reversing risks factors of obesity, especially truncal and visceral fat, hypertension, hyperlipidemia, and pre-diabetes.  The initial goal is to lose at least 5-10% of starting weight to help reduce these risk factors.  Counseling:  Intensive lifestyle modifications were discussed with Andre Brown as the most appropriate first line of treatment.  he will continue to work on diet, exercise, and weight loss efforts.  We will continue to reassess these conditions on a fairly regular basis in an attempt to decrease the patient's overall morbidity and mortality.  Evidence-based interventions for health behavior change were utilized today including the discussion of self monitoring techniques, problem-solving barriers, and SMART goal setting techniques.  Specifically, regarding patient's less desirable eating habits and patterns, we employed the technique of small changes when Andre Brown has not been able to fully commit to his prudent nutritional plan.  10. Class 2 severe obesity with serious comorbidity and body mass index (BMI) of 36.0 to 36.9 in adult, unspecified obesity type (HCC)  Taje is currently in the action stage of change and his goal is to continue with weight loss efforts. I recommend Keyon begin the structured treatment plan as follows:  He has agreed to the Category 4 Plan.  Exercise goals: As is.   Behavioral modification strategies: decreasing simple carbohydrates, meal planning and cooking strategies and planning for success.  He was informed of the importance of frequent follow-up visits to maximize his success with intensive lifestyle modifications for his multiple health conditions. He was informed we would discuss his lab results at his next visit  unless there is a critical issue that needs to be addressed sooner. G[place]oor agreed to keep his next visit at the agreed upon time to discuss these results.  Objective:   Blood pressure 111/73, pulse 73, temperature 98.5 F (36.9 C), height 5\' 8"  (1.727 m), weight 238 lb (108 kg), SpO2 98 %. Body mass index is 36.19 kg/m.  EKG: Normal sinus rhythm, rate 73 bpm.  Indirect Calorimeter completed today shows a VO2 of 376 and a REE of 2618.  His calculated basal metabolic rate is [id number] thus his basal metabolic rate is better than expected.  General: Cooperative, alert, well developed, in no acute distress. HEENT: Conjunctivae and lids unremarkable. Cardiovascular: Regular rhythm.  Lungs: Normal work of breathing. Neurologic: No focal deficits.   Lab Results  Component Value Date   CREATININE 1.17  01/24/2021   BUN 13 01/24/2021   NA 138 01/24/2021   K 4.4 01/24/2021   CL 102 01/24/2021   CO2 30 01/24/2021   Lab Results  Component Value Date   ALT 23 09/22/2018   AST 32 09/22/2018   ALKPHOS 62 09/22/2018   BILITOT 1.4 (H) 09/22/2018   Lab Results  Component Value Date   HGBA1C 7.1 (A) 01/24/2021   HGBA1C 7.5 (A) 10/25/2020   HGBA1C 7.2 (A) 07/25/2020   HGBA1C 7.1 (A) 04/19/2020   HGBA1C 7.4 (A) 01/19/2020   Lab Results  Component Value Date   TSH 0.920 02/06/2021   Lab Results  Component Value Date   WBC 6.9 09/22/2018   HGB 15.8 09/22/2018   HCT 47.4 09/22/2018   MCV 88.3 09/22/2018   PLT 245 09/22/2018   Attestation Statements:   This is the patient's first visit at [facility]. The patient's NEW PATIENT PACKET was reviewed at length. Included in the packet: current and past health history, medications, allergies, ROS, gynecologic history (women only), surgical history, family history, social history, weight history, weight loss surgery history (for those that have had weight loss surgery), nutritional evaluation, Brown and food questionnaire, PHQ9,  Epworth questionnaire, sleep habits questionnaire, patient life and health improvement goals questionnaire. These will all be scanned into the patient's chart under media.   During the visit, I independently reviewed the patient's EKG, bioimpedance scale results, and indirect calorimeter results. I used this information to tailor a meal plan for the patient that will help him to lose weight and will improve his obesity-related conditions going forward. I performed a medically necessary appropriate examination and/or evaluation. I discussed the assessment and treatment plan with the patient. The patient was provided an opportunity to ask questions and all were answered. The patient agreed with the plan and demonstrated an understanding of the instructions. Labs were ordered at this visit and will be reviewed at the next visit unless more critical results need to be addressed immediately. Clinical information was updated and documented in the EMR.   I, Insurance claims handler, CMA, am acting as Energy manager for [employer], DO.  I have reviewed the above documentation for accuracy and completeness, and I agree with the above. Carlye Grippe, D.O.  The 21st Century Cures Act was signed into law in 2016 which includes the topic of electronic health records.  This provides immediate access to information in MyChart.  This includes consultation notes, operative notes, office notes, lab results and pathology reports.  If you have any questions about what you read please let us know at your next visit so we can discuss your concerns and take corrective action if need be.  We are right here with you.

## 2021-02-20 ENCOUNTER — Encounter (INDEPENDENT_AMBULATORY_CARE_PROVIDER_SITE_OTHER): Payer: Self-pay | Admitting: Family Medicine

## 2021-02-20 ENCOUNTER — Ambulatory Visit (INDEPENDENT_AMBULATORY_CARE_PROVIDER_SITE_OTHER): Payer: Managed Care, Other (non HMO) | Admitting: Family Medicine

## 2021-02-20 ENCOUNTER — Other Ambulatory Visit: Payer: Self-pay

## 2021-02-20 VITALS — BP 116/73 | HR 67 | Temp 98.0°F | Ht 68.0 in | Wt 241.0 lb

## 2021-02-20 DIAGNOSIS — Z6836 Body mass index (BMI) 36.0-36.9, adult: Secondary | ICD-10-CM

## 2021-02-20 DIAGNOSIS — Z794 Long term (current) use of insulin: Secondary | ICD-10-CM

## 2021-02-20 DIAGNOSIS — E785 Hyperlipidemia, unspecified: Secondary | ICD-10-CM

## 2021-02-20 DIAGNOSIS — E559 Vitamin D deficiency, unspecified: Secondary | ICD-10-CM

## 2021-02-20 DIAGNOSIS — E1169 Type 2 diabetes mellitus with other specified complication: Secondary | ICD-10-CM | POA: Diagnosis not present

## 2021-02-20 DIAGNOSIS — Z9189 Other specified personal risk factors, not elsewhere classified: Secondary | ICD-10-CM

## 2021-02-20 DIAGNOSIS — E038 Other specified hypothyroidism: Secondary | ICD-10-CM | POA: Diagnosis not present

## 2021-02-20 MED ORDER — VITAMIN D (ERGOCALCIFEROL) 1.25 MG (50000 UNIT) PO CAPS: 50000.0000 [IU] | ORAL_CAPSULE | ORAL | 0 refills | Status: DC

## 2021-02-21 DIAGNOSIS — Z9189 Other specified personal risk factors, not elsewhere classified: Secondary | ICD-10-CM | POA: Insufficient documentation

## 2021-02-21 DIAGNOSIS — E119 Type 2 diabetes mellitus without complications: Secondary | ICD-10-CM | POA: Insufficient documentation

## 2021-03-02 ENCOUNTER — Other Ambulatory Visit: Payer: Self-pay | Admitting: Endocrinology

## 2021-03-04 NOTE — Progress Notes (Signed)
Chief Complaint:   OBESITY Andre Brown is here to discuss his progress with his obesity treatment plan along with follow-up of his obesity related diagnoses.   Today's visit was #: 2 Starting weight: 238 lbs Starting date: 02/06/2021 Today's weight: 241 lbs Today's date: 02/20/2021 Weight change since last visit: +3 lbs Total lbs lost to date: +3 lbs Body mass index is 36.64 kg/m.   Interim History:  Andre Brown is here today for his first follow-up office visit since starting the program with [place].  All blood work/ lab tests that were recently ordered by myself or an outside provider were reviewed with patient today per their request.   Extended time was spent counseling him on all new disease processes that were discovered or preexisting ones that are worsening.  he understands that many of these abnormalities will need to monitored regularly along with the current treatment plan of prudent dietary changes, in which we are making each and every office visit, to improve these health parameters.  We reviewed his new meal plan in detail and questions were answered.  Patient's food recall appears to be accurate and consistent with what is on plan when he is following it.   When eating on plan, his hunger and cravings are well controlled.    Andre Brown says that he is unable to cook without oil.  Unable to follow plan, he says.  For breakfast, 2 boiled eggs and nothing else.  For lunch, "I have chicken and vegetables from the cafe - not sure of amounts".  For dinner, salmon or chicken with vegetables or by itself - unsure of amount.  Snacks consist of a small bag of 240 calorie chips and an apple a day or blueberries or a handful of cherries.  He endorses hunger all day.  Endorses cravings at various times throughout the day.  Current Meal Plan: the Category 4 Plan for 10% of the time.  Current Exercise Plan: Walking for 15 minutes 3 times per week.  Assessment/Plan:   Meds ordered this  encounter  Medications   Vitamin D, Ergocalciferol, (DRISDOL) 1.25 MG (50000 UNIT) CAPS capsule    Sig: Take 1 capsule (50,000 Units total) by mouth every 7 (seven) days.    Dispense:  4 capsule    Refill:  0    Ov for rf   1. Type 2 diabetes mellitus with other specified complication, with long-term current use of insulin (HCC) Diabetes Mellitus: Not at goal. Medication: [name], Novolog. Issues reviewed: blood sugar goals, complications of diabetes mellitus, hypoglycemia prevention and treatment, exercise, and nutrition.  Blood sugar 80-250 throughout the day.  A1c 7.1.  A couple of days ago, blood sugar was 60, randomly, and he felt a little shaky.  Plan:  Discussed labs with patient today.  Management of insulin by PCP, Dr. Nehemiah Settle.  He declines new medication and hates taking it.  Wishes to work on diet/exercise for 3 months first.  The importance of regular follow up with PCP and all other specialists as scheduled was stressed to patient today. The patient will continue to focus on protein-rich, low simple carbohydrate foods. We reviewed the importance of hydration, regular exercise for stress reduction, and restorative sleep.   Lab Results  Component Value Date   HGBA1C 7.1 (A) 01/24/2021   HGBA1C 7.5 (A) 10/25/2020   HGBA1C 7.2 (A) 07/25/2020   Lab Results  Component Value Date   MICROALBUR <0.7 07/25/2020   CREATININE 1.17 01/24/2021   2.  Hyperlipidemia associated with type 2 diabetes mellitus (HCC) Lipid-lowering medications: Crestor 10 mg daily.  Rarely takes medication and believes eating better will decrease his cholesterol.   Plan:   Dietary changes: Increase soluble fiber, decrease simple carbohydrates, decrease saturated fat. Exercise changes: Moderate to vigorous-intensity aerobic activity 150 minutes per week or as tolerated. We will continue to monitor along with PCP/specialists as it pertains to his weight loss journey.  Medication compliance importance discussed with  him today.  Lab Results  Component Value Date   ALT 23 09/22/2018   AST 32 09/22/2018   ALKPHOS 62 09/22/2018   BILITOT 1.4 (H) 09/22/2018   The 10-year ASCVD risk score Denman George DC Jr., et al., 2013) is: 2.5%   Values used to calculate the score:     Age: 40 years     Sex: Male     Is Non-Hispanic African American: No     Diabetic: Yes     Tobacco smoker: No     Systolic Blood Pressure: 116 mmHg     Is BP treated: Yes     HDL Cholesterol: 53 mg/dL     Total Cholesterol: 213 mg/dL  3. Other specified hypothyroidism Course: Controlled on levothyroxine. Medication: levothyroxine 150 mcg daily.  Asymptomatic and without concerns.  Plan: Discussed labs with patient today.  Labs within normal limits.  Continue medication.  Patient was instructed not to take MVM or iron within 4 hours of taking thyroid medications.  We will continue to monitor alongside Endocrinology/PCP as it relates to his weight loss journey.   Lab Results  Component Value Date   TSH 0.920 02/06/2021   4. Vitamin D deficiency Not at goal. Current vitamin D is 37.4, tested on 02/06/2021. Optimal goal > 50 ng/dL.   Plan:  New.  Discussed labs with patient today.  - Discussed importance of vitamin D to their health and well-being.  - possible symptoms of low Vitamin D can be low energy, depressed mood, muscle aches, joint aches, osteoporosis etc. - low Vitamin D levels may be linked to an increased risk of cardiovascular events and even increased risk of cancers- such as colon and breast.  - I recommend pt take weekly prescription vit D - see script below   - Informed patient this may be a lifelong thing, and he was encouraged to continue to take the medicine until told otherwise.   - we will need to monitor levels regularly (every 3-4 mo on average) to keep levels within normal limits.  - weight loss will likely improve availability of vitamin D, thus encouraged Andre Brown to continue with meal plan and their weight loss  efforts to further improve this condition - pt's questions and concerns regarding this condition addressed.  - Start Vitamin D, Ergocalciferol, (DRISDOL) 1.25 MG (50000 UNIT) CAPS capsule; Take 1 capsule (50,000 Units total) by mouth every 7 (seven) days.  Dispense: 4 capsule; Refill: 0  5. At risk for hypoglycemia Andre Brown was given approximately 15 minutes of counseling today regarding prevention of hypoglycemia.  He was advised of symptoms of hypoglycemia.  Andre Brown was instructed to avoid skipping meals and to eat regular protein-rich meals as prescribed on the meal plan.  The patient should have readily available low calorie snacks as needed, such as [employer] fruit snack packs, if blood sugar goes too low.    6. Obesity, current BMI 36.6  Course: Andre Brown is currently in the action stage of change. As such, his goal is to continue with weight loss  efforts.   Nutrition goals: He has agreed to the Category 4 Plan.   Exercise goals:  As is.  Behavioral modification strategies: no skipping meals, meal planning strategies, better snacking choices, and planning for success.  Andre Brown has agreed to follow-up with our clinic in 2-3 weeks. He was informed of the importance of frequent follow-up visits to maximize his success with intensive lifestyle modifications for his multiple health conditions.   Objective:   Blood pressure 116/73, pulse 67, temperature 98 F (36.7 C), height 5\' 8"  (1.727 m), weight 241 lb (109.3 kg), SpO2 97 %. Body mass index is 36.64 kg/m.  General: Cooperative, alert, well developed, in no acute distress. HEENT: Conjunctivae and lids unremarkable. Cardiovascular: Regular rhythm.  Lungs: Normal work of breathing. Neurologic: No focal deficits.   Lab Results  Component Value Date   CREATININE 1.17 01/24/2021   BUN 13 01/24/2021   NA 138 01/24/2021   K 4.4 01/24/2021   CL 102 01/24/2021   CO2 30 01/24/2021   Lab Results  Component Value Date   ALT 23  09/22/2018   AST 32 09/22/2018   ALKPHOS 62 09/22/2018   BILITOT 1.4 (H) 09/22/2018   Lab Results  Component Value Date   HGBA1C 7.1 (A) 01/24/2021   HGBA1C 7.5 (A) 10/25/2020   HGBA1C 7.2 (A) 07/25/2020   HGBA1C 7.1 (A) 04/19/2020   HGBA1C 7.4 (A) 01/19/2020   Lab Results  Component Value Date   TSH 0.920 02/06/2021   Lab Results  Component Value Date   WBC 6.9 09/22/2018   HGB 15.8 09/22/2018   HCT 47.4 09/22/2018   MCV 88.3 09/22/2018   PLT 245 09/22/2018   Attestation Statements:   Reviewed by clinician on day of visit: allergies, medications, problem list, medical history, surgical history, family history, social history, and previous encounter notes.  I, 11/21/2018, CMA, am acting as Insurance claims handler for Energy manager, DO.  I have reviewed the above documentation for accuracy and completeness, and I agree with the above. [employer], D.O.  The 21st Century Cures Act was signed into law in 2016 which includes the topic of electronic health records.  This provides immediate access to information in MyChart.  This includes consultation notes, operative notes, office notes, lab results and pathology reports.  If you have any questions about what you read please let 2017 know at your next visit so we can discuss your concerns and take corrective action if need be.  We are right here with you.

## 2021-03-13 ENCOUNTER — Encounter (INDEPENDENT_AMBULATORY_CARE_PROVIDER_SITE_OTHER): Payer: Self-pay

## 2021-03-13 ENCOUNTER — Ambulatory Visit (INDEPENDENT_AMBULATORY_CARE_PROVIDER_SITE_OTHER): Payer: BC Managed Care – PPO | Admitting: Adult Health

## 2021-03-23 ENCOUNTER — Other Ambulatory Visit: Payer: Self-pay | Admitting: Endocrinology

## 2021-04-17 DIAGNOSIS — Z20822 Contact with and (suspected) exposure to covid-19: Secondary | ICD-10-CM | POA: Diagnosis not present

## 2021-05-15 ENCOUNTER — Other Ambulatory Visit: Payer: Self-pay

## 2021-05-15 ENCOUNTER — Ambulatory Visit: Payer: BC Managed Care – PPO | Admitting: Endocrinology

## 2021-05-15 VITALS — BP 126/80 | HR 79 | Ht 68.0 in | Wt 246.0 lb

## 2021-05-15 DIAGNOSIS — E1065 Type 1 diabetes mellitus with hyperglycemia: Secondary | ICD-10-CM

## 2021-05-15 LAB — POCT GLYCOSYLATED HEMOGLOBIN (HGB A1C): Hemoglobin A1C: 7.5 % — AB (ref 4.0–5.6)

## 2021-05-15 MED ORDER — NOVOLOG FLEXPEN 100 UNIT/ML ~~LOC~~ SOPN: PEN_INJECTOR | SUBCUTANEOUS | 3 refills | Status: DC

## 2021-05-15 NOTE — Progress Notes (Signed)
Subjective:    Patient ID: Andre Brown, male    DOB: 10-23-1980, 40 y.o.   MRN: [id number]  HPI Pt returns for f/u of diabetes mellitus:  DM type: 1 Dx'ed: 1999 Complications: none Therapy: insulin since 2002.  DKA: once (2002) Severe hypoglycemia: last episode was 2017.  Pancreatitis: once (2016) Pancreatic imaging: never.   Other: he has dexcom continuous glucose monitor; he took pump rx for a few mos in 2020; he takes multiple daily injections; he takes Novolog based on CHO.    Interval history: I reviewed continuous glucose monitor data.  Glucose varies from 60-340.  It is in general lowest at 3AM, and highest at 8PM-12MN.  pt states he feels well in general.  He takes [place] 50/d, and Novolog 24-26 units, 5 times a day, including at HS.   Past Medical History:  Diagnosis Date   Diabetes mellitus without complication (HCC)    History of cataract    Hypothyroidism    SOBOE (shortness of breath on exertion)    Swelling of both lower extremities    Thyroid disease     Past Surgical History:  Procedure Laterality Date   CATARACT EXTRACTION, BILATERAL  2021   WISDOM TOOTH EXTRACTION  2015    Social History   Socioeconomic History   Marital status: Married    Spouse name: Loys Shugars   Number of children: 2   Years of education: Not on file   Highest education level: Not on file  Occupational History   Occupation: Production manager  Tobacco Use   Smoking status: Never   Smokeless tobacco: Never  Substance and Sexual Activity   Alcohol use: Yes   Drug use: No   Sexual activity: Not on file  Other Topics Concern   Not on file  Social History Narrative   Not on file   Social Determinants of Health   Financial Resource Strain: Not on file  Food Insecurity: Not on file  Transportation Needs: Not on file  Physical Activity: Not on file  Stress: Not on file  Social Connections: Not on file  Intimate Partner Violence: Not on file    Current Outpatient  Medications on File Prior to Visit  Medication Sig Dispense Refill   B-D ULTRAFINE III SHORT PEN 31G X 8 MM MISC USE 4 TIMES A DAY 400 each 0   Continuous Blood Gluc Sensor (DEXCOM G6 SENSOR) MISC 3 EACH BY OTHER ROUTE SEE ADMIN INSTRUCTIONS. APPLY ONE SENSOR TO BODY ONCE EVERY 10 DAYS E11.9 9 each 12   Continuous Blood Gluc Transmit (DEXCOM G6 TRANSMITTER) MISC 1 EACH BY OTHER ROUTE SEE ADMIN INSTRUCTIONS. USE ONE TRANSMITTER ONCE EVERY 90 DAYS. 1 each 2   furosemide (LASIX) 20 MG tablet Take 20 mg by mouth daily as needed.     glucagon (GLUCAGON EMERGENCY) 1 MG injection Inject 1 mg into the muscle once as needed for up to 1 dose. 1 each 12   insulin degludec (TRESIBA FLEXTOUCH) 100 UNIT/ML FlexTouch Pen Inject 50 Units into the skin daily. 75 mL 3   ketoconazole (NIZORAL) 2 % cream Apply 1 application topically daily. 15 g 0   levothyroxine (SYNTHROID) 150 MCG tablet TAKE 1 TABLET BY MOUTH EVERY DAY (Patient taking differently: Take 175 mcg by mouth daily.) 90 tablet 3   rosuvastatin (CRESTOR) 10 MG tablet Take 10 mg by mouth at bedtime.     sildenafil (VIAGRA) 100 MG tablet TAKE 0.5-1 TABLETS (50-100 MG TOTAL) DAILY AS NEEDED FOR  ERECTILE DYSFUNCTION. (6 PER 25 DAYS) 6 tablet 3   triamcinolone cream (KENALOG) 0.1 % Apply 1 application topically 2 (two) times daily. 30 g 0   Vitamin D, Ergocalciferol, (DRISDOL) 1.25 MG (50000 UNIT) CAPS capsule Take 1 capsule (50,000 Units total) by mouth every 7 (seven) days. 4 capsule 0   No current facility-administered medications on file prior to visit.    Allergies  Allergen Reactions   Lisinopril Other (See Comments)   Loratadine Other (See Comments)   Losartan Potassium Other (See Comments)   Shellfish Allergy     Itchy throat    Family History  Adopted: Yes  Problem Relation Age of Onset   Diabetes Neg Hx     BP 126/80 (BP Location: Right Arm, Patient Position: Sitting, Cuff Size: Large)   Pulse 79   Ht 5\' 8"  (1.727 m)   Wt 246 lb  (111.6 kg)   SpO2 98%   BMI 37.40 kg/m    Review of Systems     Objective:   Physical Exam Pulses: dorsalis pedis intact bilat.   MSK: no deformity of the feet CV: no leg edema Skin:  no ulcer on the feet.  normal color and temp on the feet. Neuro: sensation is intact to touch on the feet.     Lab Results  Component Value Date   HGBA1C 7.5 (A) 05/15/2021      Assessment & Plan:  Type 1 DM Hypoglycemia, due to insulin.  Patient Instructions  Please change the Novolog to the numbers listed below.  You should avoid taking the Novolog at bedtime.    Please see 05/17/2021, to consider resuming the pump.   Please come back for a follow-up appointment in 3 months.

## 2021-05-15 NOTE — Patient Instructions (Addendum)
Please change the Novolog to the numbers listed below.  You should avoid taking the Novolog at bedtime.    Please see Andre Brown, to consider resuming the pump.   Please come back for a follow-up appointment in 3 months.

## 2021-07-25 ENCOUNTER — Other Ambulatory Visit: Payer: Self-pay

## 2021-07-25 ENCOUNTER — Other Ambulatory Visit: Payer: Self-pay | Admitting: Endocrinology

## 2021-07-25 ENCOUNTER — Other Ambulatory Visit: Payer: Self-pay | Admitting: Internal Medicine

## 2021-07-25 ENCOUNTER — Encounter: Payer: Self-pay | Admitting: Endocrinology

## 2021-07-25 DIAGNOSIS — E1065 Type 1 diabetes mellitus with hyperglycemia: Secondary | ICD-10-CM

## 2021-07-25 MED ORDER — DEXCOM G6 SENSOR MISC: 3.0000 | 12 refills | Status: DC

## 2021-07-25 MED ORDER — DEXCOM G6 TRANSMITTER MISC: 3 refills | Status: DC

## 2021-08-19 ENCOUNTER — Other Ambulatory Visit: Payer: Self-pay | Admitting: Endocrinology

## 2021-08-19 ENCOUNTER — Other Ambulatory Visit: Payer: Self-pay

## 2021-08-19 ENCOUNTER — Ambulatory Visit (INDEPENDENT_AMBULATORY_CARE_PROVIDER_SITE_OTHER): Payer: BC Managed Care – PPO | Admitting: Endocrinology

## 2021-08-19 VITALS — BP 122/80 | HR 81 | Ht 68.0 in | Wt 252.2 lb

## 2021-08-19 DIAGNOSIS — E1065 Type 1 diabetes mellitus with hyperglycemia: Secondary | ICD-10-CM

## 2021-08-19 LAB — POCT GLYCOSYLATED HEMOGLOBIN (HGB A1C): Hemoglobin A1C: 7.1 % — AB (ref 4.0–5.6)

## 2021-08-19 MED ORDER — NOVOLOG FLEXPEN 100 UNIT/ML ~~LOC~~ SOPN: PEN_INJECTOR | SUBCUTANEOUS | 3 refills | Status: DC

## 2021-08-19 MED ORDER — TIRZEPATIDE 2.5 MG/0.5ML ~~LOC~~ SOAJ: 2.5000 mg | SUBCUTANEOUS | 3 refills | Status: DC

## 2021-08-19 NOTE — Progress Notes (Signed)
Subjective:    Patient ID: Andre Brown, male    DOB: [birth year], 40 y.o.   MRN: [id number]  HPI Pt returns for f/u of diabetes mellitus:  DM type: 1 Dx'ed: 1999 Complications: none Therapy: insulin since 2002.  DKA: once (2002) Severe hypoglycemia: last episode was 2017.  Pancreatitis: once (2016) Pancreatic imaging: never.   Other: he has dexcom continuous glucose monitor; he took pump rx for a few mos in 2020; he takes multiple daily injections; he takes Novolog based on CHO.    Interval history: I reviewed continuous glucose monitor data.  Glucose varies from 65-300.  It is in general highest at 10PM-12MN.  Otherwise, There is no trend throughout the day.  pt states he feels well in general. He has mild hypoglycemia approx 2/week.  This happens with exertion.  He still wants to resume pump rx.  Past Medical History:  Diagnosis Date   Diabetes mellitus without complication (HCC)    History of cataract    Hypothyroidism    SOBOE (shortness of breath on exertion)    Swelling of both lower extremities    Thyroid disease     Past Surgical History:  Procedure Laterality Date   CATARACT EXTRACTION, BILATERAL  2021   WISDOM TOOTH EXTRACTION  2015    Social History   Socioeconomic History   Marital status: Married    Spouse name: Krystle Oberman   Number of children: 2   Years of education: Not on file   Highest education level: Not on file  Occupational History   Occupation: Production manager  Tobacco Use   Smoking status: Never   Smokeless tobacco: Never  Substance and Sexual Activity   Alcohol use: Yes   Drug use: No   Sexual activity: Not on file  Other Topics Concern   Not on file  Social History Narrative   Not on file   Social Determinants of Health   Financial Resource Strain: Not on file  Food Insecurity: Not on file  Transportation Needs: Not on file  Physical Activity: Not on file  Stress: Not on file  Social Connections: Not on file  Intimate  Partner Violence: Not on file    Current Outpatient Medications on File Prior to Visit  Medication Sig Dispense Refill   B-D ULTRAFINE III SHORT PEN 31G X 8 MM MISC USE 4 TIMES A DAY 400 each 0   Continuous Blood Gluc Sensor (DEXCOM G6 SENSOR) MISC 3 each by Other route See admin instructions. Apply one sensor to body once every 10 days; E11.9 9 each 12   Continuous Blood Gluc Transmit (DEXCOM G6 TRANSMITTER) MISC USE AS DIRECTED 1 each 3   furosemide (LASIX) 20 MG tablet Take 20 mg by mouth daily as needed.     glucagon (GLUCAGON EMERGENCY) 1 MG injection Inject 1 mg into the muscle once as needed for up to 1 dose. 1 each 12   insulin degludec (TRESIBA FLEXTOUCH) 100 UNIT/ML FlexTouch Pen Inject 50 Units into the skin daily. 75 mL 3   ketoconazole (NIZORAL) 2 % cream Apply 1 application topically daily. 15 g 0   levothyroxine (SYNTHROID) 150 MCG tablet TAKE 1 TABLET BY MOUTH EVERY DAY 90 tablet 3   rosuvastatin (CRESTOR) 10 MG tablet Take 10 mg by mouth at bedtime.     sildenafil (VIAGRA) 100 MG tablet TAKE 0.5-1 TABLETS (50-100 MG TOTAL) DAILY AS NEEDED FOR ERECTILE DYSFUNCTION. (6 PER 25 DAYS) 6 tablet 3   triamcinolone cream (KENALOG)  0.1 % Apply 1 application topically 2 (two) times daily. 30 g 0   Vitamin D, Ergocalciferol, (DRISDOL) 1.25 MG (50000 UNIT) CAPS capsule Take 1 capsule (50,000 Units total) by mouth every 7 (seven) days. 4 capsule 0   No current facility-administered medications on file prior to visit.    Allergies  Allergen Reactions   Lisinopril Other (See Comments)   Loratadine Other (See Comments)   Losartan Potassium Other (See Comments)   Shellfish Allergy     Itchy throat    Family History  Adopted: Yes  Problem Relation Age of Onset   Diabetes Neg Hx     BP 122/80   Pulse 81   Ht 5\' 8"  (1.727 m)   Wt 252 lb 3.2 oz (114.4 kg)   SpO2 96%   BMI 38.35 kg/m    Review of Systems He has weight gain    Objective:   Physical Exam    Lab Results   Component Value Date   HGBA1C 7.1 (A) 08/19/2021   Lab Results  Component Value Date   TSH 0.920 02/06/2021   Lab Results  Component Value Date   CREATININE 1.17 01/24/2021   BUN 13 01/24/2021   NA 138 01/24/2021   K 4.4 01/24/2021   CL 102 01/24/2021   CO2 30 01/24/2021      Assessment & Plan:  Type 1 DM, with insulin resist Hypoglycemia, due to insulin: we discussed use of GLP rx here.  He agrees to try [facility]  Patient Instructions  Please continue the same insulins.  However, if you are going to be active, subtract 4 units from that shot.   I have sent a prescription to your pharmacy, to add [facility].     Please see [facility], to consider resuming the pump.  Please come back for a follow-up appointment in 3 months.

## 2021-08-19 NOTE — Patient Instructions (Addendum)
Please continue the same insulins.  However, if you are going to be active, subtract 4 units from that shot.   I have sent a prescription to your pharmacy, to add [facility].     Please see Bonita Quin, to consider resuming the pump.  Please come back for a follow-up appointment in 3 months.

## 2021-08-20 ENCOUNTER — Other Ambulatory Visit: Payer: Self-pay | Admitting: Endocrinology

## 2021-08-20 MED ORDER — TRULICITY 0.75 MG/0.5ML ~~LOC~~ SOAJ: 0.7500 mg | SUBCUTANEOUS | 3 refills | Status: DC

## 2021-08-27 ENCOUNTER — Ambulatory Visit: Payer: BC Managed Care – PPO | Admitting: Gastroenterology

## 2021-08-27 ENCOUNTER — Encounter: Payer: Self-pay | Admitting: Gastroenterology

## 2021-08-27 ENCOUNTER — Other Ambulatory Visit (INDEPENDENT_AMBULATORY_CARE_PROVIDER_SITE_OTHER): Payer: BC Managed Care – PPO

## 2021-08-27 VITALS — BP 110/72 | HR 73 | Ht 68.0 in | Wt 250.0 lb

## 2021-08-27 DIAGNOSIS — R1084 Generalized abdominal pain: Secondary | ICD-10-CM

## 2021-08-27 DIAGNOSIS — R194 Change in bowel habit: Secondary | ICD-10-CM | POA: Diagnosis not present

## 2021-08-27 DIAGNOSIS — K625 Hemorrhage of anus and rectum: Secondary | ICD-10-CM

## 2021-08-27 DIAGNOSIS — R140 Abdominal distension (gaseous): Secondary | ICD-10-CM

## 2021-08-27 LAB — CBC
HCT: 43.6 % (ref 39.0–52.0)
Hemoglobin: 14.5 g/dL (ref 13.0–17.0)
MCHC: 33.2 g/dL (ref 30.0–36.0)
MCV: 88.9 fl (ref 78.0–100.0)
Platelets: 207 10*3/uL (ref 150.0–400.0)
RBC: 4.9 Mil/uL (ref 4.22–5.81)
RDW: 12.5 % (ref 11.5–15.5)
WBC: 5.8 10*3/uL (ref 4.0–10.5)

## 2021-08-27 LAB — COMPREHENSIVE METABOLIC PANEL
ALT: 25 U/L (ref 0–53)
AST: 22 U/L (ref 0–37)
Albumin: 4.4 g/dL (ref 3.5–5.2)
Alkaline Phosphatase: 68 U/L (ref 39–117)
BUN: 12 mg/dL (ref 6–23)
CO2: 26 mEq/L (ref 19–32)
Calcium: 9.3 mg/dL (ref 8.4–10.5)
Chloride: 101 mEq/L (ref 96–112)
Creatinine, Ser: 1.09 mg/dL (ref 0.40–1.50)
GFR: 83.87 mL/min (ref >60.00)
Glucose, Bld: 256 mg/dL — ABNORMAL HIGH (ref 70–99)
Potassium: 4.6 mEq/L (ref 3.5–5.1)
Sodium: 135 mEq/L (ref 135–145)
Total Bilirubin: 0.5 mg/dL (ref 0.2–1.2)
Total Protein: 7.7 g/dL (ref 6.0–8.3)

## 2021-08-27 LAB — TSH: TSH: 3.18 u[IU]/mL (ref 0.35–5.50)

## 2021-08-27 LAB — SEDIMENTATION RATE: Sed Rate: 34 mm/h — ABNORMAL HIGH (ref 0–15)

## 2021-08-27 LAB — C-REACTIVE PROTEIN: CRP: 2.5 mg/dL (ref 0.5–20.0)

## 2021-08-27 NOTE — Patient Instructions (Signed)
You have been scheduled for an abdominal ultrasound at [facility] Radiology (1st floor of hospital) on 08/30/21 at 8:00am. Please arrive 15 minutes prior to your appointment for registration. Make certain not to have anything to eat or drink 6 hours prior to your appointment. Should you need to reschedule your appointment, please contact radiology at [phone]. This test typically takes about 30 minutes to perform.  Your provider has requested that you go to the basement level for lab work before leaving today. Press "B" on the elevator. The lab is located at the first door on the left as you exit the elevator.   Your provider has requested that you have an abdominal x ray before leaving today. Please go to the basement floor to our Radiology department for the test.  A high fiber diet with plenty of fluids (up to 8 glasses of water daily) is suggested to relieve these symptoms.  Benefiber tablets or (powder form) 1 tablespoon once or twice daily can be used to keep bowels regular if needed.  If you are age 23 or younger, your body mass index should be between 19-25. Your Body mass index is 38.01 kg/m. If this is out of the aformentioned range listed, please consider follow up with your Primary Care Provider.   ________________________________________________________  The [place] [facility] providers would like to encourage you to use [employer] to communicate with providers for non-urgent requests or questions.  Due to long hold times on the telephone, sending your provider a message by [facility] may be a faster and more efficient way to get a response.  Please allow 48 business hours for a response.  Please remember that this is for non-urgent requests.   Thank you for choosing me and [facility].  Dr. Meridee Score

## 2021-08-27 NOTE — Progress Notes (Signed)
[place] VISIT   Primary Care Provider Seward Carol, MD [address] [place] [address] [phone]  Referring Provider Seward Carol, MD [address],  [place] [address] [phone]  Patient Profile: RITVIK MCZEAL is a 40 y.o. male with a pmh significant for insulin-dependent diabetes, hypothyroidism, hyperlipidemia.  The patient presents to the [facility] for an evaluation and management of problem(s) noted below:  Problem List 1. BRBPR (bright red blood per rectum)   2. Change in bowel habit   3. Bloating   4. Distended abdomen   5. Generalized abdominal pain     History of Present Illness This is the patient's first visit to the outpatient [facility] clinic.  For years the patient has been experiencing issues with alteration of his bowel habits.  He will have anywhere between 2 and 6 bowel movements per day.  His bowel movements can be semiformed to loose.  He notes that if he eats something spicy sometimes he can have more significant issues of discomfort while having diarrheal movements,  dairy has caused him significant issues in the past so he stays away from this.  He has never been evaluated for celiac disease based on what he can recall.  He deals with bloating and abdominal distention.  His stools are malodorous.  Weight has been stable to increase it at times.  Interestingly, when the patient takes laxatives every 3 to 4 months, he does note some improvement for 3 to 4 days in regards to his bloating and distention.  He thinks there may have been some significant transition in his bowel habits after he changed thyroid medications but his thyroid function panel seems to be intact.  He has brought this up previously to his providers.  A maternal uncle has a history of colon polyps but no colon cancers are noted in the family.  As he is getting older he wants to ensure that nothing is  being missed and wants to be as healthy and proactive as possible.  He has had diabetes for over 22 years.  He has never had pancreatitis.  The patient never had an upper or lower endoscopy.  GI Review of Systems Positive as above Negative for dysphagia, pyrosis, odynophagia, nausea, vomiting, decreased appetite, melena, hematochezia  Review of Systems General: Denies fevers/chills/weight loss unintentionally HEENT: Denies oral lesions Cardiovascular: Denies chest pain Pulmonary: Denies shortness of breath Gastroenterological: See HPI Genitourinary: Denies darkened urine Hematological: Denies easy bruising/bleeding Endocrine: Denies temperature intolerance Dermatological: Denies jaundice Psychological: Mood is stable Musculoskeletal: Denies new arthralgias   Medications Current Outpatient Medications  Medication Sig Dispense Refill   B-D ULTRAFINE III SHORT PEN 31G X 8 MM MISC USE 4 TIMES A DAY 400 each 0   Continuous Blood Gluc Sensor (DEXCOM G6 SENSOR) MISC 3 each by Other route See admin instructions. Apply one sensor to body once every 10 days; E11.9 9 each 12   Continuous Blood Gluc Transmit (DEXCOM G6 TRANSMITTER) MISC USE AS DIRECTED 1 each 3   Dulaglutide (TRULICITY) 2.95 AO/1.3YQ SOPN Inject 0.75 mg into the skin once a week. 6 mL 3   furosemide (LASIX) 20 MG tablet Take 20 mg by mouth daily as needed.     glucagon (GLUCAGON EMERGENCY) 1 MG injection Inject 1 mg into the muscle once as needed for up to 1 dose. 1 each 12   insulin aspart (NOVOLOG FLEXPEN) 100 UNIT/ML FlexPen 4 times a day (just  before each meal), 27-27-27-35 units, and pen needles 5/day 120 mL 3   insulin degludec (TRESIBA FLEXTOUCH) 100 UNIT/ML FlexTouch Pen Inject 50 Units into the skin daily. 75 mL 3   ketoconazole (NIZORAL) 2 % cream Apply 1 application topically daily. 15 g 0   levothyroxine (SYNTHROID) 150 MCG tablet TAKE 1 TABLET BY MOUTH EVERY DAY 90 tablet 3   rosuvastatin (CRESTOR) 10 MG tablet Take  10 mg by mouth at bedtime.     sildenafil (VIAGRA) 100 MG tablet TAKE 0.5-1 TABLETS (50-100 MG TOTAL) DAILY AS NEEDED FOR ERECTILE DYSFUNCTION. (6 PER 25 DAYS) 6 tablet 3   triamcinolone cream (KENALOG) 0.1 % Apply 1 application topically 2 (two) times daily. 30 g 0   tirzepatide (MOUNJARO) 2.5 MG/0.5ML Pen Inject 2.5 mg into the skin once a week. (Patient not taking: Reported on 08/27/2021) 6 mL 3   No current facility-administered medications for this visit.    Allergies Allergies  Allergen Reactions   Lisinopril Other (See Comments)   Loratadine Other (See Comments)   Losartan Potassium Other (See Comments)   Shellfish Allergy     Itchy throat    Histories Past Medical History:  Diagnosis Date   Diabetes mellitus without complication ([place])    History of cataract    Hypothyroidism    SOBOE (shortness of breath on exertion)    Swelling of both lower extremities    Thyroid disease    Past Surgical History:  Procedure Laterality Date   CATARACT EXTRACTION, BILATERAL  2021   WISDOM TOOTH EXTRACTION  2015   Social History   Socioeconomic History   Marital status: Married    Spouse name: Franck Vinal   Number of children: 2   Years of education: Not on file   Highest education level: Not on file  Occupational History   Occupation: Lawyer  Tobacco Use   Smoking status: Never   Smokeless tobacco: Never  Substance and Sexual Activity   Alcohol use: Yes   Drug use: No   Sexual activity: Not on file  Other Topics Concern   Not on file  Social History Narrative   Not on file   Social Determinants of Health   Financial Resource Strain: Not on file  Food Insecurity: Not on file  Transportation Needs: Not on file  Physical Activity: Not on file  Stress: Not on file  Social Connections: Not on file  Intimate Partner Violence: Not on file   Family History  Adopted: Yes  Problem Relation Age of Onset   Colon polyps Maternal Uncle    Diabetes Neg Hx     Esophageal cancer Neg Hx    Inflammatory bowel disease Neg Hx    Liver disease Neg Hx    Pancreatic cancer Neg Hx    Rectal cancer Neg Hx    Stomach cancer Neg Hx    Colon cancer Neg Hx    I have reviewed his medical, social, and family history in detail and updated the electronic medical record as necessary.    PHYSICAL EXAMINATION  BP 110/72   Pulse 73   Ht 5' 8"  (1.727 m)   Wt 250 lb (113.4 kg)   BMI 38.01 kg/m  Wt Readings from Last 3 Encounters:  08/27/21 250 lb (113.4 kg)  08/19/21 252 lb 3.2 oz (114.4 kg)  05/15/21 246 lb (111.6 kg)  GEN: NAD, appears stated age, doesn't appear chronically ill PSYCH: Cooperative, without pressured speech EYE: Conjunctivae pink, sclerae anicteric ENT: MMM,  without oral ulcers CV: RR without R/Gs  RESP: CTAB posteriorly, without wheezing GI: NABS, soft, NT/ND, protuberant abdomen, rounded, without rebound or guarding MSK/EXT: 1+ pedal edema bilaterally (he has not taken Lasix recently) SKIN: No jaundice NEURO:  Alert & Oriented x 3, no focal deficits   REVIEW OF DATA  I reviewed the following data at the time of this encounter:  GI Procedures and Studies  No relevant studies to review  Laboratory Studies  Reviewed those in epic  Imaging Studies  No relevant studies to review   ASSESSMENT  Mr. Ballentine is a 40 y.o. male with a pmh significant for insulin-dependent diabetes, hypothyroidism, hyperlipidemia.  The patient is seen today for evaluation and management of:  1. BRBPR (bright red blood per rectum)   2. Change in bowel habit   3. Bloating   4. Distended abdomen   5. Generalized abdominal pain    The patient is hemodynamically stable.  Clinically he has experienced an alteration of his bowel habits over the course of the last few years that have become almost normal for him.  With that being said symptoms do require additional work-up and evaluation to ensure that they are a result of another pathologic issues.  Ruling  out exocrine pancreas insufficiency given infections.  We will proceed with a metabolic evaluation as well.  His clinical history is suggestive of the possibility of an overflow diarrhea since he improves and after taking laxative therapy for a few days.  We will obtain a KUB to ensure large amount of stool is not present where we actually need to ask the patient to begin laxative therapy more frequently.  May consider Gas-X or simethicone use in the future.  Patient likely has stability of IBS that is the versus SIBO and may need treatment in the future as well.  Abdominal ultrasound will help [place] ensure that his pancreas well-appearing that there are no other etiologies for the significant bloating and discomfort that he is experiencing.  He has been offered an endoscopic evaluation from above and below but he would like to begin with the less invasive work-up versus.  We will move forward with his work-up as outlined below.  All patient questions were answered to the best of my ability, and the patient agrees to the aforementioned plan of action with follow-up as indicated.   PLAN  Laboratories as outlined below Stool studies as outlined below Consider fecal elastase testing to be obtained Consider IBS-D treatment for SIBO evaluation Benefiber once daily x1 week and increase to twice daily KUB to ensure constipation overflow is not occurring Abdominal ultrasound to evaluate pancreas and patient's significant symptoms of bloating discomfort and distention Endoscopic evaluation from below and above will be considered pending patient's work-up   Orders Placed This Encounter  Procedures   Stool Culture   Ova and parasite examination   US Abdomen Complete   DG Abd 2 Views   CBC   Comp Met (CMET)   TSH   Sedimentation rate   C-reactive protein   Tissue transglutaminase, IgA   Gastrointestinal Pathogen Panel PCR   Fecal fat, qualitative   Pancreatic elastase, fecal   IgA    New  Prescriptions   No medications on file   Modified Medications   No medications on file    Planned Follow Up No follow-ups on file.   Total Time in Face-to-Face and in Coordination of Care for patient including independent/personal interpretation/review of prior testing, medical history, examination, medication  adjustment, communicating results with the patient directly, and documentation within the EHR is 45 minutes.   Justice Britain, MD [facility] Office # [phone]

## 2021-08-28 LAB — IGA: Immunoglobulin A: 355 mg/dL — ABNORMAL HIGH (ref 47–310)

## 2021-08-28 LAB — TISSUE TRANSGLUTAMINASE, IGA: (tTG) Ab, IgA: <1 U/mL

## 2021-08-30 ENCOUNTER — Other Ambulatory Visit: Payer: Self-pay

## 2021-08-30 ENCOUNTER — Other Ambulatory Visit: Payer: BC Managed Care – PPO

## 2021-08-30 ENCOUNTER — Ambulatory Visit (HOSPITAL_COMMUNITY)
Admission: RE | Admit: 2021-08-30 | Discharge: 2021-08-30 | Disposition: A | Discharge status: Home / Self Care | Payer: BC Managed Care – PPO | Source: Ambulatory Visit | Attending: Gastroenterology | Admitting: Gastroenterology

## 2021-08-30 DIAGNOSIS — R140 Abdominal distension (gaseous): Secondary | ICD-10-CM

## 2021-08-30 DIAGNOSIS — R1084 Generalized abdominal pain: Secondary | ICD-10-CM | POA: Diagnosis not present

## 2021-08-30 DIAGNOSIS — K760 Fatty (change of) liver, not elsewhere classified: Secondary | ICD-10-CM | POA: Diagnosis not present

## 2021-08-30 DIAGNOSIS — K625 Hemorrhage of anus and rectum: Secondary | ICD-10-CM | POA: Insufficient documentation

## 2021-08-30 DIAGNOSIS — R194 Change in bowel habit: Secondary | ICD-10-CM | POA: Insufficient documentation

## 2021-09-03 ENCOUNTER — Encounter: Payer: Self-pay | Admitting: Gastroenterology

## 2021-09-03 ENCOUNTER — Other Ambulatory Visit: Payer: Self-pay

## 2021-09-03 LAB — STOOL CULTURE: E coli, Shiga toxin Assay: NEGATIVE

## 2021-09-03 MED ORDER — AZITHROMYCIN 500 MG PO TABS: 500.0000 mg | ORAL_TABLET | Freq: Every day | ORAL | 0 refills | Status: AC

## 2021-09-04 ENCOUNTER — Other Ambulatory Visit: Payer: Self-pay

## 2021-09-04 DIAGNOSIS — R768 Other specified abnormal immunological findings in serum: Secondary | ICD-10-CM

## 2021-09-04 LAB — FECAL FAT, QUALITATIVE
Fat Qual Neutral, Stl: NORMAL
Fat Qual Total, Stl: NORMAL

## 2021-09-04 LAB — GI PROFILE, STOOL, PCR
Adenovirus F 40/41: NOT DETECTED
Astrovirus: NOT DETECTED
C difficile toxin A/B: NOT DETECTED
Campylobacter: NOT DETECTED
Cryptosporidium: NOT DETECTED
Cyclospora cayetanensis: NOT DETECTED
Entamoeba histolytica: NOT DETECTED
Enteroaggregative E coli: NOT DETECTED
Enteropathogenic E coli: DETECTED — AB
Enterotoxigenic E coli: NOT DETECTED
Giardia lamblia: NOT DETECTED
Norovirus GI/GII: DETECTED — AB
Plesiomonas shigelloides: NOT DETECTED
Rotavirus A: NOT DETECTED
Salmonella: NOT DETECTED
Sapovirus: NOT DETECTED
Shiga-toxin-producing E coli: NOT DETECTED
Shigella/Enteroinvasive E coli: NOT DETECTED
Vibrio cholerae: NOT DETECTED
Vibrio: NOT DETECTED
Yersinia enterocolitica: NOT DETECTED

## 2021-09-04 LAB — OVA AND PARASITE EXAMINATION
CONCENTRATE RESULT:: NONE SEEN
MICRO NUMBER:: 12737662
SPECIMEN QUALITY:: ADEQUATE
TRICHROME RESULT:: NONE SEEN

## 2021-09-04 LAB — PANCREATIC ELASTASE, FECAL: Pancreatic Elastase-1, Stool: >500 mcg/g

## 2021-09-04 NOTE — Telephone Encounter (Signed)
Due to elevated IgA level as well as ESR, we will plan to recheck these in 8 weeks.

## 2021-09-11 ENCOUNTER — Other Ambulatory Visit: Payer: Self-pay

## 2021-09-11 ENCOUNTER — Ambulatory Visit
Admission: EM | Admit: 2021-09-11 | Discharge: 2021-09-11 | Disposition: A | Discharge status: Home / Self Care | Payer: BC Managed Care – PPO | Attending: Internal Medicine | Admitting: Internal Medicine

## 2021-09-11 DIAGNOSIS — H65193 Other acute nonsuppurative otitis media, bilateral: Secondary | ICD-10-CM

## 2021-09-11 MED ORDER — AMOXICILLIN 875 MG PO TABS: 875.0000 mg | ORAL_TABLET | Freq: Two times a day (BID) | ORAL | 0 refills | Status: AC

## 2021-09-11 NOTE — Discharge Instructions (Signed)
You have bilateral ear infection which is being treated with amoxicillin antibiotic.

## 2021-09-11 NOTE — ED Triage Notes (Signed)
4 day h/o bilateral ear itchiness and two days of bilateral ear pain. Right ear has a  "Popping" sensation. Both ears feel clogged. No meds taken.

## 2021-09-11 NOTE — ED Provider Notes (Signed)
[facility]    CSN: [id number] Arrival date & time: 09/11/21  1413      History   Chief Complaint Chief Complaint  Patient presents with   Otalgia    bilateral    HPI Andre Brown is a 40 y.o. male.   Patient presents with bilateral itchiness and discomfort that started approximately 4 days ago.  Patient reports that he thinks that he had the flu approximately 2 weeks ago.  Denies any fevers.  Denies any decreased hearing.  Patient reports that it feels like his ears are "full" and he has a popping sensation in his ears.  Patient has not taken any medications to help alleviate symptoms.  Denies any drainage from the ear.   Otalgia  Past Medical History:  Diagnosis Date   Diabetes mellitus without complication (HCC)    History of cataract    Hypothyroidism    SOBOE (shortness of breath on exertion)    Swelling of both lower extremities    Thyroid disease     Patient Active Problem List   Diagnosis Date Noted   Diabetes mellitus (HCC) 02/21/2021   At risk for hypoglycemia 02/21/2021   Other fatigue 02/06/2021   SOBOE (shortness of breath on exertion) 02/06/2021   Hyperlipidemia associated with type 2 diabetes mellitus (HCC) 02/06/2021   Other specified hypothyroidism 02/06/2021   At risk for heart disease 02/06/2021   Vitamin D deficiency 02/06/2021   Depression screening 02/06/2021   Weight gain 01/24/2021   Erectile dysfunction 11/14/2019   History of muscle stiffness 12/22/2018   Diabetes (HCC) 12/22/2018    Past Surgical History:  Procedure Laterality Date   CATARACT EXTRACTION, BILATERAL  2021   WISDOM TOOTH EXTRACTION  2015       Home Medications    Prior to Admission medications   Medication Sig Start Date End Date Taking? Authorizing Provider  amoxicillin (AMOXIL) 875 MG tablet Take 1 tablet (875 mg total) by mouth 2 (two) times daily for 10 days. 09/11/21 09/21/21 Yes Sevin Langenbach, Acie Fredrickson, FNP  B-D ULTRAFINE III SHORT PEN 31G X 8  MM MISC USE 4 TIMES A DAY 08/29/20   Romero Belling, MD  Continuous Blood Gluc Sensor (DEXCOM G6 SENSOR) MISC 3 each by Other route See admin instructions. Apply one sensor to body once every 10 days; E11.9 07/25/21   Romero Belling, MD  Continuous Blood Gluc Transmit (DEXCOM G6 TRANSMITTER) MISC USE AS DIRECTED 07/25/21   Romero Belling, MD  Dulaglutide (TRULICITY) 0.75 MG/0.5ML SOPN Inject 0.75 mg into the skin once a week. 08/20/21   Romero Belling, MD  furosemide (LASIX) 20 MG tablet Take 20 mg by mouth daily as needed. 01/10/21   [provider]  glucagon (GLUCAGON EMERGENCY) 1 MG injection Inject 1 mg into the muscle once as needed for up to 1 dose. 05/16/19   Romero Belling, MD  insulin aspart (NOVOLOG FLEXPEN) 100 UNIT/ML FlexPen 4 times a day (just before each meal), 27-27-27-35 units, and pen needles 5/day 08/19/21   Romero Belling, MD  insulin degludec (TRESIBA FLEXTOUCH) 100 UNIT/ML FlexTouch Pen Inject 50 Units into the skin daily. 01/24/21   Romero Belling, MD  ketoconazole (NIZORAL) 2 % cream Apply 1 application topically daily. 08/31/19   Hall-Potvin, [place], PA-C  levothyroxine (SYNTHROID) 150 MCG tablet TAKE 1 TABLET BY MOUTH EVERY DAY 07/25/21   Romero Belling, MD  rosuvastatin (CRESTOR) 10 MG tablet Take 10 mg by mouth at bedtime. 02/05/21   [provider]  sildenafil (VIAGRA) 100 MG tablet TAKE 0.5-1 TABLETS (50-100 MG TOTAL) DAILY AS NEEDED FOR ERECTILE DYSFUNCTION. (6 PER 25 DAYS) 03/04/21   Renato Shin, MD  tirzepatide [facility]) 2.5 MG/0.5ML Pen Inject 2.5 mg into the skin once a week. Patient not taking: Reported on 08/27/2021 08/19/21   Renato Shin, MD  triamcinolone cream (KENALOG) 0.1 % Apply 1 application topically 2 (two) times daily. 08/31/19   Hall-Potvin, [place], PA-C    Family History Family History  Adopted: Yes  Problem Relation Age of Onset   Colon polyps Maternal Uncle    Diabetes Neg Hx    Esophageal cancer Neg Hx    Inflammatory bowel disease Neg  Hx    Liver disease Neg Hx    Pancreatic cancer Neg Hx    Rectal cancer Neg Hx    Stomach cancer Neg Hx    Colon cancer Neg Hx     Social History Social History   Tobacco Use   Smoking status: Never   Smokeless tobacco: Never  Substance Use Topics   Alcohol use: Yes   Drug use: No     Allergies   Lisinopril, Loratadine, Losartan potassium, and Shellfish allergy   Review of Systems Review of Systems Per HPI  Physical Exam Triage Vital Signs ED Triage Vitals  Enc Vitals Group     BP 09/11/21 1439 120/81     Pulse Rate 09/11/21 1439 82     Resp 09/11/21 1439 18     Temp 09/11/21 1439 98.3 F (36.8 C)     Temp Source 09/11/21 1439 Oral     SpO2 09/11/21 1439 96 %     Weight --      Height --      Head Circumference --      Peak Flow --      Pain Score 09/11/21 1441 1     Pain Loc --      Pain Edu? --      Excl. in [place]? --    No data found.  Updated Vital Signs BP 120/81 (BP Location: Left Arm)    Pulse 82    Temp 98.3 F (36.8 C) (Oral)    Resp 18    SpO2 96%   Visual Acuity Right Eye Distance:   Left Eye Distance:   Bilateral Distance:    Right Eye Near:   Left Eye Near:    Bilateral Near:     Physical Exam Constitutional:      General: He is not in acute distress.    Appearance: Normal appearance. He is not toxic-appearing or diaphoretic.  HENT:     Head: Normocephalic and atraumatic.     Right Ear: Ear canal and external ear normal. No laceration, drainage, swelling or tenderness. No middle ear effusion. No mastoid tenderness. Tympanic membrane is erythematous. Tympanic membrane is not perforated or bulging.     Left Ear: Ear canal and external ear normal. No laceration, drainage, swelling or tenderness.  No middle ear effusion. No mastoid tenderness. Tympanic membrane is erythematous. Tympanic membrane is not perforated or bulging.  Eyes:     Extraocular Movements: Extraocular movements intact.     Conjunctiva/sclera: Conjunctivae normal.   Pulmonary:     Effort: Pulmonary effort is normal.  Neurological:     General: No focal deficit present.     Mental Status: He is alert and oriented to person, place, and time. Mental status is at baseline.  Psychiatric:  Mood and Affect: Mood normal.        Behavior: Behavior normal.        Thought Content: Thought content normal.        Judgment: Judgment normal.     UC Treatments / Results  Labs (all labs ordered are listed, but only abnormal results are displayed) Labs Reviewed - No data to display  EKG   Radiology No results found.  Procedures Procedures (including critical care time)  Medications Ordered in UC Medications - No data to display  Initial Impression / Assessment and Plan / UC Course  I have reviewed the triage vital signs and the nursing notes.  Pertinent labs & imaging results that were available during my care of the patient were reviewed by me and considered in my medical decision making (see chart for details).     Will treat bilateral otitis media with amoxicillin antibiotic.  No red flags on exam.  Discussed return precautions.  Patient verbalized understanding and was agreeable with plan. Final Clinical Impressions(s) / UC Diagnoses   Final diagnoses:  Other non-recurrent acute nonsuppurative otitis media of both ears     Discharge Instructions      You have bilateral ear infection which is being treated with amoxicillin antibiotic.    ED Prescriptions     Medication Sig Dispense Auth. Provider   amoxicillin (AMOXIL) 875 MG tablet Take 1 tablet (875 mg total) by mouth 2 (two) times daily for 10 days. 20 tablet [place], Acie Fredrickson, [place]      PDMP not reviewed this encounter.   Gustavus Bryant, [place] 09/11/21 [phone]

## 2021-11-19 ENCOUNTER — Other Ambulatory Visit: Payer: Self-pay | Admitting: Endocrinology

## 2021-11-19 ENCOUNTER — Ambulatory Visit (INDEPENDENT_AMBULATORY_CARE_PROVIDER_SITE_OTHER): Payer: BC Managed Care – PPO | Admitting: Endocrinology

## 2021-11-19 ENCOUNTER — Other Ambulatory Visit: Payer: Self-pay

## 2021-11-19 VITALS — BP 122/78 | HR 75 | Ht 68.0 in | Wt 252.8 lb

## 2021-11-19 DIAGNOSIS — E1065 Type 1 diabetes mellitus with hyperglycemia: Secondary | ICD-10-CM

## 2021-11-19 LAB — POCT GLYCOSYLATED HEMOGLOBIN (HGB A1C): Hemoglobin A1C: 7 % — AB (ref 4.0–5.6)

## 2021-11-19 MED ORDER — TIRZEPATIDE 5 MG/0.5ML ~~LOC~~ SOAJ: 5.0000 mg | SUBCUTANEOUS | 3 refills | Status: DC

## 2021-11-19 MED ORDER — TRESIBA FLEXTOUCH 100 UNIT/ML ~~LOC~~ SOPN: 40.0000 [IU] | PEN_INJECTOR | Freq: Every day | SUBCUTANEOUS | 3 refills | Status: DC

## 2021-11-19 NOTE — Patient Instructions (Addendum)
I have sent 2 prescriptions to your pharmacy, to increase the [facility], and to reduce the Tresiba to 40 units per day.  Please continue the same other medications.   Please come back for a follow-up appointment in May.

## 2021-11-19 NOTE — Progress Notes (Signed)
Subjective:    Patient ID: Andre Brown, male    DOB: May 31, 1981, 41 y.o.   MRN: [id number]  HPI Pt returns for f/u of diabetes mellitus:  DM type: 1 Dx'ed: 1999 Complications: none Therapy: insulin since 2002.  DKA: once (2002) Severe hypoglycemia: last episode was 2017.  Pancreatitis: once (2016) Pancreatic imaging: never.   Other: he has dexcom continuous glucose monitor; he took pump rx for a few mos in 2020; he takes multiple daily injections; he takes Novolog based on CHO.    Interval history: I reviewed continuous glucose monitor data.  Glucose varies from 60-320.  It is in general highest at 10PM, and lowest at 2AM.  It is flat overnight, and generally increases throughout the day  pt states he feels well in general. He has mild hypoglycemia approx 2/week.  He takes both [place] and trulicity.   Past Medical History:  Diagnosis Date   Diabetes mellitus without complication (HCC)    History of cataract    Hypothyroidism    SOBOE (shortness of breath on exertion)    Swelling of both lower extremities    Thyroid disease     Past Surgical History:  Procedure Laterality Date   CATARACT EXTRACTION, BILATERAL  2021   WISDOM TOOTH EXTRACTION  2015    Social History   Socioeconomic History   Marital status: Married    Spouse name: Elzie Knisley   Number of children: 2   Years of education: Not on file   Highest education level: Not on file  Occupational History   Occupation: Production manager  Tobacco Use   Smoking status: Never   Smokeless tobacco: Never  Substance and Sexual Activity   Alcohol use: Yes   Drug use: No   Sexual activity: Not on file  Other Topics Concern   Not on file  Social History Narrative   Not on file   Social Determinants of Health   Financial Resource Strain: Not on file  Food Insecurity: Not on file  Transportation Needs: Not on file  Physical Activity: Not on file  Stress: Not on file  Social Connections: Not on file   Intimate Partner Violence: Not on file    Current Outpatient Medications on File Prior to Visit  Medication Sig Dispense Refill   B-D ULTRAFINE III SHORT PEN 31G X 8 MM MISC USE 4 TIMES A DAY 400 each 0   Continuous Blood Gluc Sensor (DEXCOM G6 SENSOR) MISC 3 each by Other route See admin instructions. Apply one sensor to body once every 10 days; E11.9 9 each 12   Continuous Blood Gluc Transmit (DEXCOM G6 TRANSMITTER) MISC USE AS DIRECTED 1 each 3   furosemide (LASIX) 20 MG tablet Take 20 mg by mouth daily as needed.     glucagon (GLUCAGON EMERGENCY) 1 MG injection Inject 1 mg into the muscle once as needed for up to 1 dose. 1 each 12   insulin aspart (NOVOLOG FLEXPEN) 100 UNIT/ML FlexPen 4 times a day (just before each meal), 27-27-27-35 units, and pen needles 5/day 120 mL 3   ketoconazole (NIZORAL) 2 % cream Apply 1 application topically daily. 15 g 0   levothyroxine (SYNTHROID) 150 MCG tablet TAKE 1 TABLET BY MOUTH EVERY DAY 90 tablet 3   rosuvastatin (CRESTOR) 10 MG tablet Take 10 mg by mouth at bedtime.     sildenafil (VIAGRA) 100 MG tablet TAKE 0.5-1 TABLETS (50-100 MG TOTAL) DAILY AS NEEDED FOR ERECTILE DYSFUNCTION. (6 PER 25 DAYS) 6  tablet 3   triamcinolone cream (KENALOG) 0.1 % Apply 1 application topically 2 (two) times daily. 30 g 0   No current facility-administered medications on file prior to visit.    Allergies  Allergen Reactions   Lisinopril Other (See Comments)   Loratadine Other (See Comments)   Losartan Potassium Other (See Comments)   Shellfish Allergy     Itchy throat    Family History  Adopted: Yes  Problem Relation Age of Onset   Colon polyps Maternal Uncle    Diabetes Neg Hx    Esophageal cancer Neg Hx    Inflammatory bowel disease Neg Hx    Liver disease Neg Hx    Pancreatic cancer Neg Hx    Rectal cancer Neg Hx    Stomach cancer Neg Hx    Colon cancer Neg Hx     BP 122/78    Pulse 75    Ht 5\' 8"  (1.727 m)    Wt 252 lb 12.8 oz (114.7 kg)    SpO2  97%    BMI 38.44 kg/m   Review of Systems Denies N/V/HB/bloating    Objective:   Physical Exam   Lab Results  Component Value Date   HGBA1C 7.0 (A) 11/19/2021       Assessment & Plan:  Type 1 DM Hypoglycemia, due to insulin: this limits aggressiveness of glycemic control Duplication of therapy: we discussed.  I advised stopping trulicity, and making a bigger increase in [place], but he wants to continue both for now.  Patient Instructions  I have sent 2 prescriptions to your pharmacy, to increase the [facility], and to reduce the Tresiba to 40 units per day.  Please continue the same other medications.   Please come back for a follow-up appointment in May.

## 2021-11-20 MED ORDER — TRULICITY 4.5 MG/0.5ML ~~LOC~~ SOAJ: 4.5000 mg | SUBCUTANEOUS | 3 refills | Status: DC

## 2021-11-21 ENCOUNTER — Encounter: Payer: Self-pay | Admitting: Endocrinology

## 2021-11-25 ENCOUNTER — Ambulatory Visit: Payer: BC Managed Care – PPO | Admitting: Nutrition

## 2021-12-28 ENCOUNTER — Ambulatory Visit
Admission: EM | Admit: 2021-12-28 | Discharge: 2021-12-28 | Disposition: A | Discharge status: Home / Self Care | Payer: BC Managed Care – PPO

## 2021-12-28 ENCOUNTER — Other Ambulatory Visit: Payer: Self-pay

## 2021-12-28 DIAGNOSIS — H1030 Unspecified acute conjunctivitis, unspecified eye: Secondary | ICD-10-CM | POA: Diagnosis not present

## 2021-12-28 DIAGNOSIS — H6121 Impacted cerumen, right ear: Secondary | ICD-10-CM | POA: Diagnosis not present

## 2022-01-03 ENCOUNTER — Other Ambulatory Visit: Payer: Self-pay | Admitting: Endocrinology

## 2022-02-03 ENCOUNTER — Ambulatory Visit: Payer: BC Managed Care – PPO | Admitting: Endocrinology

## 2022-02-04 ENCOUNTER — Ambulatory Visit: Payer: BC Managed Care – PPO | Admitting: Internal Medicine

## 2022-02-12 ENCOUNTER — Ambulatory Visit (INDEPENDENT_AMBULATORY_CARE_PROVIDER_SITE_OTHER): Payer: BC Managed Care – PPO | Admitting: Endocrinology

## 2022-02-12 ENCOUNTER — Encounter: Payer: Self-pay | Admitting: Endocrinology

## 2022-02-12 VITALS — BP 138/80 | HR 79 | Ht 68.5 in | Wt 247.0 lb

## 2022-02-12 DIAGNOSIS — E1065 Type 1 diabetes mellitus with hyperglycemia: Secondary | ICD-10-CM | POA: Diagnosis not present

## 2022-02-12 DIAGNOSIS — E782 Mixed hyperlipidemia: Secondary | ICD-10-CM | POA: Diagnosis not present

## 2022-02-12 LAB — LIPID PANEL
Cholesterol: 235 mg/dL — ABNORMAL HIGH (ref 0–200)
HDL: 49.6 mg/dL (ref >39.00)
LDL Cholesterol: 158 mg/dL — ABNORMAL HIGH (ref 0–99)
NonHDL: 185.06
Total CHOL/HDL Ratio: 5
Triglycerides: 135 mg/dL (ref 0.0–149.0)
VLDL: 27 mg/dL (ref 0.0–40.0)

## 2022-02-12 LAB — POCT GLYCOSYLATED HEMOGLOBIN (HGB A1C): Hemoglobin A1C: 7.5 % — AB (ref 4.0–5.6)

## 2022-02-12 LAB — BASIC METABOLIC PANEL
BUN: 12 mg/dL (ref 6–23)
CO2: 30 mEq/L (ref 19–32)
Calcium: 9.9 mg/dL (ref 8.4–10.5)
Chloride: 102 mEq/L (ref 96–112)
Creatinine, Ser: 1.16 mg/dL (ref 0.40–1.50)
GFR: 77.58 mL/min (ref >60.00)
Glucose, Bld: 58 mg/dL — ABNORMAL LOW (ref 70–99)
Potassium: 3.8 mEq/L (ref 3.5–5.1)
Sodium: 140 mEq/L (ref 135–145)

## 2022-02-12 LAB — MICROALBUMIN / CREATININE URINE RATIO
Creatinine,U: 298.8 mg/dL
Microalb Creat Ratio: 0.4 mg/g (ref 0.0–30.0)
Microalb, Ur: 1.3 mg/dL (ref 0.0–1.9)

## 2022-02-12 MED ORDER — OMNIPOD 5 DEXG7G6 PODS GEN 5 MISC: 3 refills | Status: DC

## 2022-02-12 MED ORDER — TRULICITY 1.5 MG/0.5ML ~~LOC~~ SOAJ: SUBCUTANEOUS | 1 refills | Status: DC

## 2022-02-12 MED ORDER — OMNIPOD 5 DEXG7G6 INTRO GEN 5 KIT: PACK | 0 refills | Status: AC

## 2022-02-12 NOTE — Patient Instructions (Addendum)
For high sugar take 1 unit per 20 mg over 100 target  Carb ratio   Tresiba 48 units   Trulicity 1.5 next Rx

## 2022-02-12 NOTE — Progress Notes (Signed)
Patient ID: Andre Brown, male   DOB: Mar 18, 1981, 41 y.o.   MRN: [id number]    Chief complaint : Follow up of Type 1 Diabetes  History of Present Illness:          Date of diagnosis: 1999     Prior history:   He was reportedly started on insulin 2002 According to available records A1c is mostly between 7-7.5 He has been taking Lantus insulin previously and subsequently [place] Also in 2022 he was given Trulicity  Recent history:     INSULIN regimen :50 units Tresiba in the morning daily NOVOLOG mealtime doses 12 units + carbohydrate ratio 1:5  His A1c is 7.5 compared to previously 7%  Current management and diabetes problems identified as follows  His blood sugars appear to be fairly labile at all different times and only improve by early morning Overall appears to have much higher readings before and after dinner but not consistently again He tries to take his mealtime insulin before starting to eat However if he has a high blood sugar he states somewhat arbitrary doses of NovoLog, last night he took 20 units extra He thinks he is generally fairly good with estimated his insulin needed but is not specifically counting carbohydrates or using a carbohydrate ratio consistently, mostly taking 3 units per starch portion Although he did use a pump 3 years ago with a T-slim pump he had issues with the infusion site infections and he would pull out the tube with daily activities  Glucose patterns from Dexcom download: OVERNIGHT glucose: Blood sugars are the highest at midnight nearly 210 gradually decreased to low average of 138 at 3 AM and then gradually rise, rare hypoglycemia at 3-5 AM Postprandial glucoses on an average do not rise excessively but HIGHEST blood sugars are after dinner averaging 247 at 9-10 PM Hyperglycemia is seen mostly after meals but frequently late afternoon also with significant variability Also rebound hyperglycemia seen after episodes of low normal or low  sugars Hypoglycemia: Rarely at 3 AM or about 2-3 PM and usually transient He was given Trulicity also in 2122 which he has not been taking lately; does have difficulty losing weight Currently not on any regular exercise regimen  2-week data: GMI is 7.9 for the last 2 weeks  CGM use % of time 86  2-week average/GV 190 +/-72  Time in range     48   %  % Time Above 180 32  % Time above 250 19  % Time Below 70 1     Self-care: The diet that the patient has been following is:  Meal times: Somewhat variable         Exercise: Only occasional biking or walking.          Most recent dietitian/nurse educator visit : 2020         Retinal exams, Most recent: 9/22  Wt Readings from Last 3 Encounters:  02/12/22 247 lb (112 kg)  11/19/21 252 lb 12.8 oz (114.7 kg)  08/27/21 250 lb (113.4 kg)    Diabetes labs:  Lab Results  Component Value Date   HGBA1C 7.5 (A) 02/12/2022   HGBA1C 7.0 (A) 11/19/2021   HGBA1C 7.1 (A) 08/19/2021   Lab Results  Component Value Date   MICROALBUR <0.7 07/25/2020   CREATININE 1.09 08/27/2021    Office Visit on 02/12/2022  Component Date Value Ref Range Status   Hemoglobin A1C 02/12/2022 7.5 (A)  4.0 - 5.6 % Final  Allergies as of 02/12/2022       Reactions   Lisinopril Other (See Comments)   Loratadine Other (See Comments)   Losartan Potassium Other (See Comments)   Shellfish Allergy    Itchy throat        Medication List        Accurate as of Feb 12, 2022  4:40 PM. If you have any questions, ask your nurse or doctor.          STOP taking these medications    Trulicity 4.5 HQ/7.5FF Sopn Generic drug: Dulaglutide Replaced by: Trulicity 1.5 MB/8.4YK Sopn Stopped by: Elayne Snare, MD       TAKE these medications    B-D ULTRAFINE III SHORT PEN 31G X 8 MM Misc Generic drug: Insulin Pen Needle USE 4 TIMES A DAY   Dexcom G6 Sensor Misc 3 each by Other route See admin instructions. Apply one sensor to body once every 10 days;  E11.9   Dexcom G6 Transmitter Misc USE AS DIRECTED   furosemide 20 MG tablet Commonly known as: LASIX Take 20 mg by mouth daily as needed.   Glucagon Emergency 1 MG Kit Inject 1 mg into the muscle once as needed for up to 1 dose.   ketoconazole 2 % cream Commonly known as: NIZORAL Apply 1 application topically daily.   levothyroxine 150 MCG tablet Commonly known as: SYNTHROID TAKE 1 TABLET BY MOUTH EVERY DAY   NovoLOG FlexPen 100 UNIT/ML FlexPen Generic drug: insulin aspart INJECT 17-26 UNITS INTO THE SKIN 3 (THREE) TIMES DAILY WITH MEALS. AND PEN NEEDLES 4/DAY   Omnipod 5 G6 Intro (Gen 5) Kit Change pod every 3 days Started by: Elayne Snare, MD   Omnipod 5 G6 Pod (Gen 5) Misc Change pod every 3 days Started by: Elayne Snare, MD   rosuvastatin 10 MG tablet Commonly known as: CRESTOR Take 10 mg by mouth at bedtime.   sildenafil 100 MG tablet Commonly known as: VIAGRA TAKE 0.5-1 TABLETS (50-100 MG TOTAL) DAILY AS NEEDED FOR ERECTILE DYSFUNCTION. (6 PER 25 DAYS)   Tresiba FlexTouch 100 UNIT/ML FlexTouch Pen Generic drug: insulin degludec Inject 40 Units into the skin daily.   triamcinolone cream 0.1 % Commonly known as: KENALOG Apply 1 application topically 2 (two) times daily.   Trulicity 1.5 ZL/9.3TT Sopn Generic drug: Dulaglutide Inject weekly, start after 0.75 mg doses complete Replaces: Trulicity 4.5 SV/7.7LT Sopn Started by: Elayne Snare, MD        Allergies:  Allergies  Allergen Reactions   Lisinopril Other (See Comments)   Loratadine Other (See Comments)   Losartan Potassium Other (See Comments)   Shellfish Allergy     Itchy throat    Past Medical History:  Diagnosis Date   Diabetes mellitus without complication ([place])    History of cataract    Hypothyroidism    SOBOE (shortness of breath on exertion)    Swelling of both lower extremities    Thyroid disease     Past Surgical History:  Procedure Laterality Date   CATARACT EXTRACTION,  BILATERAL  2021   WISDOM TOOTH EXTRACTION  2015    Family History  Adopted: Yes  Problem Relation Age of Onset   Colon polyps Maternal Uncle    Diabetes Neg Hx    Esophageal cancer Neg Hx    Inflammatory bowel disease Neg Hx    Liver disease Neg Hx    Pancreatic cancer Neg Hx    Rectal cancer Neg Hx    Stomach cancer  Neg Hx    Colon cancer Neg Hx     Social History:  reports that he has never smoked. He has never used smokeless tobacco. He reports current alcohol use. He reports that he does not use drugs.    Review of Systems:   Blood pressure: Normal without medications  BP Readings from Last 3 Encounters:  02/12/22 138/80  11/19/21 122/78  09/11/21 120/81     Lipids: On Crestor 10 mg daily, no recent labs available  No results found for: CHOL, HDL, LDLCALC, LDLDIRECT, TRIG, CHOLHDL   Diabetes complications: ED  He has been told to have hypothyroidism and is now taking 150 mcg of levothyroxine  Lab Results  Component Value Date   TSH 3.18 08/27/2021   TSH 0.920 02/06/2021   TSH 4.30 07/25/2020   FREET4 1.44 02/06/2021   FREET4 0.99 07/25/2020     Physical Examination:  BP 138/80   Pulse 79   Ht 5' 8.5" (1.74 m)   Wt 247 lb (112 kg)   SpO2 96%   BMI 37.01 kg/m         ASSESSMENT/PLAN   Diabetes type 1 with inadequate control   Currently is on basal bolus insulin regimen with Tresiba and NovoLog  Blood sugars are highly labile and especially higher after meals but inconsistent Also overnight readings may vary but tend to be low normal at times Blood sugars tend to be low at times before dinnertime but also has significant rebound subsequently  Problems identified: Usually not getting enough coverage for meals in the evening but patterns are variable Low normal sugars or low sugars occasionally overnight or before dinnertime indicating excessive basal insulin Using arbitrary doses of insulin to correct high readings which may sometimes cause  hypoglycemia Significant variability of blood sugars from day-to-day Previously had issues with the insulin pump because of pulling out the tubing at times or site infections                       Recommendations:  He will look into the OmniPod 5 INSULIN PUMP with the closed-loop system and discussed the benefits of the closed-loop system as well as integration with the Dexcom system If this is not affordable he can go back to the T-insulin pump In the meantime reduce Tresiba to 48 units, this will reduce tendency to any overnight and late afternoon hypoglycemia He will try to estimate carbohydrates better and use 1: 5 carbohydrate coverage ratio If this is not effective he can 4 units per start serving Also use 1: 20 correction factor for significantly high blood sugars, discussed how this can be taken before meals or late at night if blood sugars are unusually high May need to consider bolusing 10 to 15 minutes before eating unless the blood sugar is low normal    Check lipids for hypercholesterolemia  Urine microalbumin will need to be checked  Elayne Snare 02/12/2022, 4:40 PM    Addendum: Lipids show high cholesterol, needs to increase rosuvastatin to 20 mg unless he has been missing doses lately   Elayne Snare

## 2022-02-18 ENCOUNTER — Telehealth: Payer: Self-pay

## 2022-02-18 NOTE — Telephone Encounter (Signed)
Omnipod pharmacy called and states they need PA done on both Omnipod kit and omnipod sensors. Please start PA.

## 2022-02-23 ENCOUNTER — Other Ambulatory Visit (HOSPITAL_COMMUNITY): Payer: Self-pay

## 2022-02-23 ENCOUNTER — Telehealth: Payer: Self-pay | Admitting: Pharmacy Technician

## 2022-02-23 DIAGNOSIS — E1065 Type 1 diabetes mellitus with hyperglycemia: Secondary | ICD-10-CM

## 2022-02-23 NOTE — Telephone Encounter (Signed)
Patient Advocate Encounter  Received notification from [employer] that prior authorization for OMNIPOD 5 [place] is required.   PA submitted on 6.4.23 KIT Key [id number] PODS Key [id number] Status is pending   [facility] will continue to follow  Luciano Cutter, CPhT [facility] Phone: [phone]

## 2022-02-27 ENCOUNTER — Encounter: Payer: Self-pay | Admitting: Endocrinology

## 2022-03-05 NOTE — Telephone Encounter (Signed)
Patient called in states that he has received his pump. Needs appt with Bonita Quin to have pump set up. Also, are we staying with novolog insulin? He is currently using pens.

## 2022-03-07 MED ORDER — "SYRINGE/NEEDLE (DISP) 30G X 1/2"" 1 ML MISC": 2 refills | Status: AC

## 2022-03-07 MED ORDER — INSULIN ASPART 100 UNIT/ML IJ SOLN: INTRAMUSCULAR | 3 refills | Status: DC

## 2022-03-11 NOTE — Telephone Encounter (Signed)
Patient received pump

## 2022-03-12 ENCOUNTER — Telehealth: Payer: Self-pay | Admitting: Nutrition

## 2022-03-12 NOTE — Telephone Encounter (Signed)
Pump appointment scheduled for next wednesday

## 2022-03-12 NOTE — Telephone Encounter (Signed)
Opened in error

## 2022-03-19 ENCOUNTER — Encounter: Payer: BC Managed Care – PPO | Attending: Endocrinology | Admitting: Nutrition

## 2022-03-19 DIAGNOSIS — E1165 Type 2 diabetes mellitus with hyperglycemia: Secondary | ICD-10-CM | POA: Insufficient documentation

## 2022-03-19 NOTE — Progress Notes (Signed)
Patient was trained on the use of the OmniPod 5 insulin pump.  He did not take his Lantus last night or this AM as directed.  Settings were put in per Dr. Remus Blake orders:  Basal rate: MN: 1.2 u/hr, 6AM: 1.4u/hr,  ISF: 40,  I/C:1:4, Duration of action: 5 hours, Max bolus 30, target:MN: 120, 6AM: 110, with corrections over 130.   He filled a pod with Novolog insulin, and attached this to his left upper outer arm.  He is currently wearing a Dexcom, and this was linked to the pod.   He was shown how to do a bolus, and when/how to do a correction dose.  His PDM was linked to [facility], and to [facility].   We discussed high blood sugar protocol, alerts and alarms, sick day guidelines.  He reported good understanding of this with no final questions.  He was told to call the pump 800 help line if questions,and to call the office if blood sugars drop below 70.  He agreed to do this.  He has an appointment with Dr. Lucianne Muss on Friday of this week.   We reviewed all topics on the checklist and patient signed checklist as understanding all topics with no final questions.

## 2022-03-20 ENCOUNTER — Telehealth: Payer: Self-pay | Admitting: Nutrition

## 2022-03-20 NOTE — Patient Instructions (Signed)
Read over starter booklet and go on line to read manuel  Call 800 help line if questions Call office if blood sugars drop below 70 or remain over 250. Return Friday to see Dr. Lucianne Muss

## 2022-03-20 NOTE — Telephone Encounter (Signed)
Patient reports that blood sugars have been in the 300s all day yesterday, despite several correction boluses and injection with a syringe.  He took a 4u bolus at 4AM and FBS today was 160 at 8AM.  He took another bolus of 4u and it is now at 10:30, 135.  He reports having bolused despite eating very few carbs. Supper was a piece of fried chicken with 7 chips.  He took 17u for this.  No HS snack.  He was told to call the office today if blood sugars go to 300 again.  He has an appointment with Dr Lucianne Muss tomorrow at 8:15.  He was reminded of this.

## 2022-03-21 ENCOUNTER — Telehealth (INDEPENDENT_AMBULATORY_CARE_PROVIDER_SITE_OTHER): Payer: BC Managed Care – PPO | Admitting: Endocrinology

## 2022-03-21 ENCOUNTER — Encounter: Payer: Self-pay | Admitting: Endocrinology

## 2022-03-21 DIAGNOSIS — E1065 Type 1 diabetes mellitus with hyperglycemia: Secondary | ICD-10-CM

## 2022-03-21 NOTE — Progress Notes (Signed)
Patient ID: JOSEPHINE RUDNICK, male   DOB: [birth year], 41 y.o.   MRN: [id number]   I connected with the above-named patient by video enabled telemedicine application and verified that I am speaking with the correct person. The patient was explained the limitations of evaluation and management by telemedicine and the availability of in person appointments.  Patient also understood that there may be a patient responsible charge related to this service  Location of the patient: Patient's home  Location of the provider: Physician office Only the patient and myself were participating in the encounter The patient understood the above statements and agreed to proceed.  Chief complaint : Follow up of Type 1 Diabetes  History of Present Illness:          Date of diagnosis: 1999     Prior history:   He was reportedly started on insulin 2002 According to available records A1c is mostly between 7-7.5 He has been taking Lantus insulin previously and subsequently [place] Also in 2022 he was given Trulicity  Recent history:     PREVIOUS INSULIN regimen :50 units Tresiba in the morning daily NOVOLOG mealtime doses 12 units + carbohydrate ratio 1:5  INSULIN pump settings: BASAL rate 1.2 at midnight and 1.4 at 6 AM BOLUS settings: Carb ratio 1:4, SENSITIVITY = 1: 40, target 120 12 AM to 6 AM and then 110 Active insulin 5 hours Correction threshold 130  His A1c is 7.5 compared to previously 7%  Current management and diabetes problems identified as follows  Prior to starting metformin he had the following issues with his blood sugar control and patterns Blood sugars previously were highly labile and especially higher after meals but inconsistent Also overnight readings would vary but tend to be low normal at times Blood sugars tend to be low at times before dinnertime but also has significant rebound subsequently He is now on his third day of the insulin pump Although the first evening when he  was on the pump his blood sugars were persistently high especially after meals they were improved overnight yesterday Also Premeal readings were excellent throughout the day yesterday POSTPRANDIAL readings evaluated only at lunch and dinner yesterday  At lunch yesterday he had a total of 45 g of carbohydrate but relatively higher fat with chips and since blood sugar went up significantly to over 250 took additional 10+8 units correction boluses with good results Also at dinnertime with 30 g of carbohydrates his blood sugar was moderately increased around 200 However this morning his blood sugars have been excellent near the target of 120 He has maintained in automated mode consistently No hypoglycemia   Previous Dexcom 2-week data: GMI is 7.9 for the last 2 weeks  CGM use % of time 86  2-week average/GV 190 +/-72  Time in range     48   %  % Time Above 180 32  % Time above 250 19  % Time Below 70 1     Self-care: The diet that the patient has been following is:  Meal times: Somewhat variable         Exercise: Only occasional biking or walking.          Most recent dietitian/nurse educator visit : 2020         Retinal exams, Most recent: 9/22  Wt Readings from Last 3 Encounters:  02/12/22 247 lb (112 kg)  11/19/21 252 lb 12.8 oz (114.7 kg)  08/27/21 250 lb (113.4 kg)    Diabetes  labs:  Lab Results  Component Value Date   HGBA1C 7.5 (A) 02/12/2022   HGBA1C 7.0 (A) 11/19/2021   HGBA1C 7.1 (A) 08/19/2021   Lab Results  Component Value Date   MICROALBUR 1.3 02/12/2022   LDLCALC 158 (H) 02/12/2022   CREATININE 1.16 02/12/2022    No visits with results within 1 Week(s) from this visit.  Latest known visit with results is:  Office Visit on 02/12/2022  Component Date Value Ref Range Status   Hemoglobin A1C 02/12/2022 7.5 (A)  4.0 - 5.6 % Final   Cholesterol 02/12/2022 235 (H)  0 - 200 mg/dL Final   ATP III Classification       Desirable:  < 200 mg/dL                Borderline High:  200 - 239 mg/dL          High:  > = 240 mg/dL   Triglycerides 02/12/2022 135.0  0.0 - 149.0 mg/dL Final   Normal:  <150 mg/dLBorderline High:  150 - 199 mg/dL   HDL 02/12/2022 49.60  >39.00 mg/dL Final   VLDL 02/12/2022 27.0  0.0 - 40.0 mg/dL Final   LDL Cholesterol 02/12/2022 158 (H)  0 - 99 mg/dL Final   Total CHOL/HDL Ratio 02/12/2022 5   Final                  Men          Women1/2 Average Risk     3.4          3.3Average Risk          5.0          4.42X Average Risk          9.6          7.13X Average Risk          15.0          11.0                       NonHDL 02/12/2022 185.06   Final   NOTE:  Non-HDL goal should be 30 mg/dL higher than patient's LDL goal (i.e. LDL goal of < 70 mg/dL, would have non-HDL goal of < 100 mg/dL)   Microalb, Ur 02/12/2022 1.3  0.0 - 1.9 mg/dL Final   Creatinine,U 02/12/2022 298.8  mg/dL Final   Microalb Creat Ratio 02/12/2022 0.4  0.0 - 30.0 mg/g Final   Sodium 02/12/2022 140  135 - 145 mEq/L Final   Potassium 02/12/2022 3.8  3.5 - 5.1 mEq/L Final   Chloride 02/12/2022 102  96 - 112 mEq/L Final   CO2 02/12/2022 30  19 - 32 mEq/L Final   Glucose, Bld 02/12/2022 58 (L)  70 - 99 mg/dL Final   BUN 02/12/2022 12  6 - 23 mg/dL Final   Creatinine, Ser 02/12/2022 1.16  0.40 - 1.50 mg/dL Final   GFR 02/12/2022 77.58  >60.00 mL/min Final   Calculated using the CKD-EPI Creatinine Equation (2021)   Calcium 02/12/2022 9.9  8.4 - 10.5 mg/dL Final    Allergies as of 03/21/2022       Reactions   Lisinopril Other (See Comments)   Loratadine Other (See Comments)   Losartan Potassium Other (See Comments)   Shellfish Allergy    Itchy throat        Medication List        Accurate as of March 21, 2022  8:18 AM. If you have any questions, ask your nurse or doctor.          B-D ULTRAFINE III SHORT PEN 31G X 8 MM Misc Generic drug: Insulin Pen Needle USE 4 TIMES A DAY   Dexcom G6 Sensor Misc 3 each by Other route See admin instructions.  Apply one sensor to body once every 10 days; E11.9   Dexcom G6 Transmitter Misc USE AS DIRECTED   furosemide 20 MG tablet Commonly known as: LASIX Take 20 mg by mouth daily as needed.   Glucagon Emergency 1 MG Kit Inject 1 mg into the muscle once as needed for up to 1 dose.   insulin aspart 100 UNIT/ML injection Commonly known as: novoLOG Inject up to 80 units into pump.   ketoconazole 2 % cream Commonly known as: NIZORAL Apply 1 application topically daily.   levothyroxine 150 MCG tablet Commonly known as: SYNTHROID TAKE 1 TABLET BY MOUTH EVERY DAY   Omnipod 5 G6 Intro (Gen 5) Kit Change pod every 3 days   Omnipod 5 G6 Pod (Gen 5) Misc Change pod every 3 days   rosuvastatin 10 MG tablet Commonly known as: CRESTOR Take 10 mg by mouth at bedtime.   sildenafil 100 MG tablet Commonly known as: VIAGRA TAKE 0.5-1 TABLETS (50-100 MG TOTAL) DAILY AS NEEDED FOR ERECTILE DYSFUNCTION. (6 PER 25 DAYS)   Syringe/Needle (Disp) 30G X 1/2" 1 ML Misc Use to inject insulin into to pump   [place] FlexTouch 100 UNIT/ML FlexTouch Pen Generic drug: insulin degludec Inject 40 Units into the skin daily.   triamcinolone cream 0.1 % Commonly known as: KENALOG Apply 1 application topically 2 (two) times daily.   Trulicity 1.5 TI/4.5YK Sopn Generic drug: Dulaglutide Inject weekly, start after 0.75 mg doses complete        Allergies:  Allergies  Allergen Reactions   Lisinopril Other (See Comments)   Loratadine Other (See Comments)   Losartan Potassium Other (See Comments)   Shellfish Allergy     Itchy throat    Past Medical History:  Diagnosis Date   Diabetes mellitus without complication ([place])    History of cataract    Hypothyroidism    SOBOE (shortness of breath on exertion)    Swelling of both lower extremities    Thyroid disease     Past Surgical History:  Procedure Laterality Date   CATARACT EXTRACTION, BILATERAL  2021   WISDOM TOOTH EXTRACTION  2015     Family History  Adopted: Yes  Problem Relation Age of Onset   Colon polyps Maternal Uncle    Diabetes Neg Hx    Esophageal cancer Neg Hx    Inflammatory bowel disease Neg Hx    Liver disease Neg Hx    Pancreatic cancer Neg Hx    Rectal cancer Neg Hx    Stomach cancer Neg Hx    Colon cancer Neg Hx     Social History:  reports that he has never smoked. He has never used smokeless tobacco. He reports current alcohol use. He reports that he does not use drugs.    Review of Systems:   Blood pressure: Normal without medications  BP Readings from Last 3 Encounters:  02/12/22 138/80  11/19/21 122/78  09/11/21 120/81     Lipids: With 10 mg Crestor his lipids are still high and has been recommended 20 mg  Lab Results  Component Value Date   CHOL 235 (H) 02/12/2022   HDL 49.60 02/12/2022   [place]  158 (H) 02/12/2022   TRIG 135.0 02/12/2022   CHOLHDL 5 02/12/2022     Diabetes complications: ED  He has been told to have hypothyroidism and is now taking 150 mcg of levothyroxine  Lab Results  Component Value Date   TSH 3.18 08/27/2021   TSH 0.920 02/06/2021   TSH 4.30 07/25/2020   FREET4 1.44 02/06/2021   FREET4 0.99 07/25/2020     Physical Examination:  There were no vitals taken for this visit.        ASSESSMENT/PLAN   Diabetes type 1 with inadequate control   Current treatment: OmniPod pump with NOVOLOG insulin  See history of present illness for detailed discussion of current diabetes management, blood sugar patterns and problems identified  As above his blood sugars on the CGM were related in detail and his day-to-day management discussed Discussed his pump management, automated mode, definition of various parameters on the pump Also discussed that given his previous history he is likely needing much more bolus insulin than basal insulin Currently basal insulin has been averaging about 20 units a day with the pump suspending basal frequently  yesterday during the day indicating overestimation of his required basal settings Also with the current insulin usage he is able to continue his initial insulin pod for up to 3 days                      Recommendations:   He will change his carbohydrate coverage to 1: 2, he was able to program this while on the video encounter Also discussed that if he is eating any higher fat meals or snacks such as potato chips he will need to add another 3 to 5 units insulin to bolus total Continue bolusing before starting to eat consistently  No change in blood sugar targets or correction factor as yet Continue 5-hour active insulin time considering that he is usually taking large amount of insulin and boluses and less than basal We will need to see what her blood sugar patterns over the next 3 weeks especially when he does any exercise He can likely stop his Trulicity when his finished with this as not clear if this is helping with his postprandial readings at home  Follow-up in 3 weeks  We will also review his lipid management on the next visit  Elayne Snare 03/21/2022, 8:18 AM    Total visit time for his diabetes management, evaluation of his pump and CGM activity and counseling = 30-minute    Elayne Snare

## 2022-03-26 ENCOUNTER — Telehealth: Payer: Self-pay | Admitting: Nutrition

## 2022-03-26 DIAGNOSIS — E1065 Type 1 diabetes mellitus with hyperglycemia: Secondary | ICD-10-CM

## 2022-03-26 MED ORDER — OMNIPOD 5 DEXG7G6 PODS GEN 5 MISC: 3 refills | Status: DC

## 2022-03-26 NOTE — Telephone Encounter (Signed)
Please order a 3 month supply of pods.  Thank you

## 2022-03-26 NOTE — Telephone Encounter (Signed)
Patient reports that he has no difficulty using the OmniPod 5 pump.  But, he is using a pod every 1 and a half days.  Says 2 pods fell off in the heat.  Also has no pod at pharmacy to pick up.  Pt. Is wanting to know if he can use the U-200 Humalog, so that 1 pod will last for 3 days.

## 2022-03-26 NOTE — Addendum Note (Signed)
Addended by: Eliseo Squires on: 03/26/2022 05:09 PM   Modules accepted: Orders

## 2022-03-31 ENCOUNTER — Other Ambulatory Visit: Payer: Self-pay | Admitting: Endocrinology

## 2022-03-31 DIAGNOSIS — E1065 Type 1 diabetes mellitus with hyperglycemia: Secondary | ICD-10-CM

## 2022-03-31 MED ORDER — HUMALOG KWIKPEN 200 UNIT/ML ~~LOC~~ SOPN: PEN_INJECTOR | SUBCUTANEOUS | 3 refills | Status: DC

## 2022-03-31 NOTE — Telephone Encounter (Signed)
Humalog U200 is not covered by insurance

## 2022-03-31 NOTE — Addendum Note (Signed)
Addended by: Eliseo Squires on: 03/31/2022 11:26 AM   Modules accepted: Orders

## 2022-04-01 ENCOUNTER — Other Ambulatory Visit: Payer: Self-pay | Admitting: Endocrinology

## 2022-04-01 ENCOUNTER — Other Ambulatory Visit: Payer: Self-pay

## 2022-04-01 DIAGNOSIS — E1065 Type 1 diabetes mellitus with hyperglycemia: Secondary | ICD-10-CM

## 2022-04-01 MED ORDER — INSULIN ASPART 100 UNIT/ML IJ SOLN: INTRAMUSCULAR | 3 refills | Status: DC

## 2022-04-02 NOTE — Telephone Encounter (Signed)
Patient notified and knows that PA is needed and that we have sent to PA team to start PA

## 2022-04-08 ENCOUNTER — Telehealth: Payer: Self-pay | Admitting: Pharmacy Technician

## 2022-04-08 ENCOUNTER — Other Ambulatory Visit (HOSPITAL_COMMUNITY): Payer: Self-pay

## 2022-04-08 NOTE — Telephone Encounter (Signed)
Patient Advocate Encounter   Received notification from C[employer] that prior authorization for Humalog 200 is required/requested.  Per Test Claim: must be a 30 or 90 day supply, but non preferred. Ins wants pt to use Fiasp or Novolog. Neither offer the high concentration form.    PA started on 04/08/22 to [employer] via CoverMyMeds - clinical questions not yet populated. Key [id number] Status is pending  Pharmacy Patient Advocate Fax:  [phone]

## 2022-04-09 ENCOUNTER — Other Ambulatory Visit (HOSPITAL_COMMUNITY): Payer: Self-pay

## 2022-04-09 NOTE — Telephone Encounter (Signed)
Submitted PA today. 04/09/22

## 2022-04-10 ENCOUNTER — Other Ambulatory Visit (HOSPITAL_COMMUNITY): Payer: Self-pay

## 2022-04-10 NOTE — Telephone Encounter (Signed)
Patient Advocate Encounter  Prior Authorization for Humalog KwikPen has been approved.    PA# PA Case ID: [id number] Effective dates: 04/09/22 through 04/10/23  Per Test Claim Patients co-pay is $0.   Spoke with Pharmacy to Process. They will need to order.   Patient Advocate Fax:  [phone]

## 2022-04-15 ENCOUNTER — Other Ambulatory Visit (HOSPITAL_COMMUNITY): Payer: Self-pay

## 2022-04-17 ENCOUNTER — Other Ambulatory Visit: Payer: Self-pay

## 2022-04-17 DIAGNOSIS — E1065 Type 1 diabetes mellitus with hyperglycemia: Secondary | ICD-10-CM

## 2022-04-17 MED ORDER — HUMALOG KWIKPEN 200 UNIT/ML ~~LOC~~ SOPN: PEN_INJECTOR | SUBCUTANEOUS | 3 refills | Status: DC

## 2022-04-17 MED ORDER — OMNIPOD 5 DEXG7G6 PODS GEN 5 MISC: 3 refills | Status: DC

## 2022-04-17 NOTE — Telephone Encounter (Signed)
Patient would like for you to call him and walk him through on how to change settings for the humalog 200.

## 2022-04-17 NOTE — Telephone Encounter (Signed)
Bonita Quin will need new setting for the humalog 200 for patient.

## 2022-04-18 ENCOUNTER — Telehealth: Payer: Self-pay | Admitting: Dietician

## 2022-04-18 NOTE — Telephone Encounter (Signed)
Called patient to make pump changes since being changed to the U-200 insulin.  He is not currently using a pod.  Change from U-100 insulin to U-200 as he was going through PODs too quickly.  Patient was able to verbalize the reason for the changes.  These changes are for use with the U-200 insulin.  Patient was able to make the following updated changes to his insulin pump for use with the U-200 insulin.  Per MD order the following changes were made:  Insulin to carb ratio was changed to 1:4 (from 1:2) The Sensitivity factor (correction factor) was doubled The basal rate was reduced by 50% 12 am-6 am changed to 0.6 units (from 1.2) 6 am to 12 am was changed to 0.7 units (from 1.4)  Because patient is not currently wearing a pod he cannot change his pump to manual mode but verbalized how to do this. Instructed patient when he starts a new pod to change to manual mode for 48 hours while his pump gets used to the new insulin.  Patient to call for any questions.

## 2022-04-21 ENCOUNTER — Ambulatory Visit (INDEPENDENT_AMBULATORY_CARE_PROVIDER_SITE_OTHER): Payer: BC Managed Care – PPO

## 2022-04-21 ENCOUNTER — Encounter: Payer: Self-pay | Admitting: Orthopaedic Surgery

## 2022-04-21 ENCOUNTER — Ambulatory Visit: Payer: BC Managed Care – PPO | Admitting: Orthopaedic Surgery

## 2022-04-21 ENCOUNTER — Ambulatory Visit: Payer: Self-pay

## 2022-04-21 DIAGNOSIS — M25532 Pain in left wrist: Secondary | ICD-10-CM

## 2022-04-21 DIAGNOSIS — M25531 Pain in right wrist: Secondary | ICD-10-CM | POA: Diagnosis not present

## 2022-04-21 MED ORDER — DICLOFENAC SODIUM 75 MG PO TBEC: 75.0000 mg | DELAYED_RELEASE_TABLET | Freq: Two times a day (BID) | ORAL | 1 refills | Status: DC | PRN

## 2022-04-21 NOTE — Progress Notes (Signed)
The patient is a very pleasant right-hand-dominant gentleman who comes in with bilateral wrist pain this been going on for about 6 to 8 months now.  He denies any numbness and tingling.  He is a type I diabetic with a hemoglobin A1c of 7.1.  He says it hurts quite in both wrists while he does perform push-ups.  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  There has been no trauma that he is aware of.  He does report decreased motion in both wrists.  Examination of both wrist do show that he lacks full supination of both wrist but pronation is full in flexion extension is full but it is painful.  There is no atrophy of his muscles.  2 views of both wrist show no acute findings at the carpal bones or the DRUJ on either wrist.  There is no malalignment that I can see.  I would like to try diclofenac as an anti-inflammatory.  I am going to have him stop performing push-ups and I would like to send him to our hand therapists to see if they can provide any modalities that will decrease in pain and improve his range of motion.  We will then see him back in 4 weeks.  If this does not improve, a MRI of at least his left wrist would be warranted.  All questions and concerns were answered and addressed.  He agrees with this treatment plan.

## 2022-04-22 ENCOUNTER — Other Ambulatory Visit: Payer: Self-pay

## 2022-04-22 ENCOUNTER — Telehealth: Payer: Self-pay | Admitting: Nutrition

## 2022-04-22 DIAGNOSIS — M25531 Pain in right wrist: Secondary | ICD-10-CM

## 2022-04-22 DIAGNOSIS — M25532 Pain in left wrist: Secondary | ICD-10-CM

## 2022-04-22 NOTE — Telephone Encounter (Signed)
Patient was contacted, and he reports someone from the office contacted him.  I confirmed that all of the changes were made correctly

## 2022-04-22 NOTE — Telephone Encounter (Signed)
Patient reports that someone called him and changes were made to the PDM.  I verified that basal rate was set to: MN: 0.6, 6AM; 0.7.   I/C: 4, ISF: 80, timing 5 hours, and target: 120 from MN to 6AM, and 110 from 6AM to MN, with correction over 130.

## 2022-04-23 NOTE — Telephone Encounter (Signed)
Humalog U200 only comes in pens.

## 2022-04-30 ENCOUNTER — Encounter (INDEPENDENT_AMBULATORY_CARE_PROVIDER_SITE_OTHER): Payer: Self-pay

## 2022-04-30 DIAGNOSIS — H9209 Otalgia, unspecified ear: Secondary | ICD-10-CM | POA: Diagnosis not present

## 2022-05-01 ENCOUNTER — Other Ambulatory Visit: Payer: Self-pay | Admitting: Endocrinology

## 2022-05-01 DIAGNOSIS — E782 Mixed hyperlipidemia: Secondary | ICD-10-CM

## 2022-05-01 DIAGNOSIS — E1065 Type 1 diabetes mellitus with hyperglycemia: Secondary | ICD-10-CM

## 2022-05-02 ENCOUNTER — Encounter: Payer: BC Managed Care – PPO | Admitting: Rehabilitative and Restorative Service Providers"

## 2022-05-02 NOTE — Progress Notes (Signed)
This encounter was created in error - please disregard.

## 2022-05-12 ENCOUNTER — Encounter: Payer: BC Managed Care – PPO | Admitting: Rehabilitative and Restorative Service Providers"

## 2022-05-12 ENCOUNTER — Other Ambulatory Visit (INDEPENDENT_AMBULATORY_CARE_PROVIDER_SITE_OTHER): Payer: BC Managed Care – PPO

## 2022-05-12 DIAGNOSIS — E782 Mixed hyperlipidemia: Secondary | ICD-10-CM | POA: Diagnosis not present

## 2022-05-12 DIAGNOSIS — E1065 Type 1 diabetes mellitus with hyperglycemia: Secondary | ICD-10-CM | POA: Diagnosis not present

## 2022-05-12 LAB — COMPREHENSIVE METABOLIC PANEL
ALT: 54 U/L — ABNORMAL HIGH (ref 0–53)
AST: 37 U/L (ref 0–37)
Albumin: 4.1 g/dL (ref 3.5–5.2)
Alkaline Phosphatase: 82 U/L (ref 39–117)
BUN: 10 mg/dL (ref 6–23)
CO2: 27 mEq/L (ref 19–32)
Calcium: 9.2 mg/dL (ref 8.4–10.5)
Chloride: 102 mEq/L (ref 96–112)
Creatinine, Ser: 1.1 mg/dL (ref 0.40–1.50)
GFR: 82.55 mL/min (ref >60.00)
Glucose, Bld: 92 mg/dL (ref 70–99)
Potassium: 4 mEq/L (ref 3.5–5.1)
Sodium: 138 mEq/L (ref 135–145)
Total Bilirubin: 0.5 mg/dL (ref 0.2–1.2)
Total Protein: 7 g/dL (ref 6.0–8.3)

## 2022-05-12 LAB — LIPID PANEL
Cholesterol: 161 mg/dL (ref 0–200)
HDL: 47.9 mg/dL (ref >39.00)
LDL Cholesterol: 96 mg/dL (ref 0–99)
NonHDL: 113.12
Total CHOL/HDL Ratio: 3
Triglycerides: 87 mg/dL (ref 0.0–149.0)
VLDL: 17.4 mg/dL (ref 0.0–40.0)

## 2022-05-12 LAB — HEMOGLOBIN A1C: Hgb A1c MFr Bld: 7.5 % — ABNORMAL HIGH (ref 4.6–6.5)

## 2022-05-14 ENCOUNTER — Ambulatory Visit: Payer: BC Managed Care – PPO | Admitting: Endocrinology

## 2022-05-14 ENCOUNTER — Encounter: Payer: Self-pay | Admitting: Endocrinology

## 2022-05-14 VITALS — BP 112/70 | HR 73 | Ht 68.5 in | Wt 249.8 lb

## 2022-05-14 DIAGNOSIS — R945 Abnormal results of liver function studies: Secondary | ICD-10-CM | POA: Diagnosis not present

## 2022-05-14 DIAGNOSIS — E782 Mixed hyperlipidemia: Secondary | ICD-10-CM

## 2022-05-14 DIAGNOSIS — E1065 Type 1 diabetes mellitus with hyperglycemia: Secondary | ICD-10-CM

## 2022-05-14 MED ORDER — PIOGLITAZONE HCL 15 MG PO TABS: 15.0000 mg | ORAL_TABLET | Freq: Every day | ORAL | 2 refills | Status: DC

## 2022-05-14 NOTE — Progress Notes (Signed)
Patient ID: Andre Brown, male   DOB: 1980-10-11, 41 y.o.   MRN: [id number]    Chief complaint : Follow up of Type 1 Diabetes  History of Present Illness:          Date of diagnosis: 1999     Prior history:   He was reportedly started on insulin 2002 According to available records A1c is mostly between 7-7.5 He has been taking Lantus insulin previously and subsequently [place] Also in 2022 he was given Trulicity  PREVIOUS INSULIN regimen :50 units Tresiba in the morning daily NOVOLOG mealtime doses 12 units + carbohydrate ratio 1:5  Recent history:      INSULIN pump settings with HUMALOG U-200 insulin: BASAL rate 0.6 at midnight and 0.7 at 6 AM BOLUS settings: Carb ratio 1:4, SENSITIVITY = 1: 80, target 120 12 AM to 6 AM and then 110 Active insulin 5 hours Correction threshold 130, target 120 at midnight and 110 at 6 AM   His A1c is 7.5 again  Current management and diabetes problems identified as follows  He has continued to use the insulin pump with generally improvement in his blood sugars and now in target range 60% of the time compared to 48 previously Also has less variability compared to injectable insulin Because of needing large amount of insulin he is now taking U-200 insulin Current daily dose based on his pump settings is about 58 units lately with only 29% in bolus insulin He has maintained in automated mode consistently His main difficulty appears to be inadequate postprandial control of blood sugars especially in the evenings after dinner This is appearing to be at least partly related to bolusing late after starting the meal but still appears to be getting higher blood sugars averaging around 200 postprandially Hypoglycemia has been only minimal and only 1 significant possibly from late boluses at night With not taking Trulicity he has not had any significant change in his weight or insulin requirement  DEXCOM data interpretation currently  Blood sugar  data available since 8/16 OVERNIGHT blood sugars are relatively high around midnight and only after about 3 AM appear to be in near the target range and only once low normal May have a slight dawn phenomenon also before breakfast at 9 AM but not consistent HYPERGLYCEMIA after meals his present periodically at breakfast and occasionally afternoon but appears to be fairly consistent after 7-8 PM until about midnight No significant hypoglycemia except once around midnight  CGM use % of time   2-week average/GV 169/33  Time in range 60  % Time Above 180 29  % Time above 250 10  % Time Below 70 1      Previous Dexcom 2-week data: GMI 7.9 for the last 2 weeks  CGM use % of time 86  2-week average/GV 190 +/-72  Time in range     48   %  % Time Above 180 32  % Time above 250 19  % Time Below 70 1    Meal times: Somewhat variable         Exercise: Only occasional biking or walking.          Most recent dietitian/nurse educator visit : 2020         Retinal exams, Most recent: 9/22  Wt Readings from Last 3 Encounters:  05/14/22 249 lb 12.8 oz (113.3 kg)  02/12/22 247 lb (112 kg)  11/19/21 252 lb 12.8 oz (114.7 kg)    Diabetes labs:  Lab Results  Component Value Date   HGBA1C 7.5 (H) 05/12/2022   HGBA1C 7.5 (A) 02/12/2022   HGBA1C 7.0 (A) 11/19/2021   Lab Results  Component Value Date   MICROALBUR 1.3 02/12/2022   LDLCALC 96 05/12/2022   CREATININE 1.10 05/12/2022    Lab on 05/12/2022  Component Date Value Ref Range Status   Cholesterol 05/12/2022 161  0 - 200 mg/dL Final   ATP III Classification       Desirable:  < 200 mg/dL               Borderline High:  200 - 239 mg/dL          High:  > = 240 mg/dL   Triglycerides 05/12/2022 87.0  0.0 - 149.0 mg/dL Final   Normal:  <150 mg/dLBorderline High:  150 - 199 mg/dL   HDL 05/12/2022 47.90  >39.00 mg/dL Final   VLDL 05/12/2022 17.4  0.0 - 40.0 mg/dL Final   LDL Cholesterol 05/12/2022 96  0 - 99 mg/dL Final   Total CHOL/HDL  Ratio 05/12/2022 3   Final                  Men          Women1/2 Average Risk     3.4          3.3Average Risk          5.0          4.42X Average Risk          9.6          7.13X Average Risk          15.0          11.0                       NonHDL 05/12/2022 113.12   Final   NOTE:  Non-HDL goal should be 30 mg/dL higher than patient's LDL goal (i.e. LDL goal of < 70 mg/dL, would have non-HDL goal of < 100 mg/dL)   Sodium 05/12/2022 138  135 - 145 mEq/L Final   Potassium 05/12/2022 4.0  3.5 - 5.1 mEq/L Final   Chloride 05/12/2022 102  96 - 112 mEq/L Final   CO2 05/12/2022 27  19 - 32 mEq/L Final   Glucose, Bld 05/12/2022 92  70 - 99 mg/dL Final   BUN 05/12/2022 10  6 - 23 mg/dL Final   Creatinine, Ser 05/12/2022 1.10  0.40 - 1.50 mg/dL Final   Total Bilirubin 05/12/2022 0.5  0.2 - 1.2 mg/dL Final   Alkaline Phosphatase 05/12/2022 82  39 - 117 U/L Final   AST 05/12/2022 37  0 - 37 U/L Final   ALT 05/12/2022 54 (H)  0 - 53 U/L Final   Total Protein 05/12/2022 7.0  6.0 - 8.3 g/dL Final   Albumin 05/12/2022 4.1  3.5 - 5.2 g/dL Final   GFR 05/12/2022 82.55  >60.00 mL/min Final   Calculated using the CKD-EPI Creatinine Equation (2021)   Calcium 05/12/2022 9.2  8.4 - 10.5 mg/dL Final   Hgb A1c MFr Bld 05/12/2022 7.5 (H)  4.6 - 6.5 % Final   Glycemic Control Guidelines for People with Diabetes:Non Diabetic:  <6%Goal of Therapy: <7%Additional Action Suggested:  >8%     Allergies as of 05/14/2022       Reactions   Lisinopril Other (See Comments)   Loratadine Other (See Comments)   Losartan Potassium  Other (See Comments)   Shellfish Allergy    Itchy throat        Medication List        Accurate as of May 14, 2022 10:32 AM. If you have any questions, ask your nurse or doctor.          B-D ULTRAFINE III SHORT PEN 31G X 8 MM Misc Generic drug: Insulin Pen Needle USE 4 TIMES A DAY   Dexcom G6 Sensor Misc 3 each by Other route See admin instructions. Apply one sensor to body  once every 10 days; E11.9   Dexcom G6 Transmitter Misc USE AS DIRECTED   diclofenac 75 MG EC tablet Commonly known as: VOLTAREN Take 1 tablet (75 mg total) by mouth 2 (two) times daily between meals as needed.   furosemide 20 MG tablet Commonly known as: LASIX Take 20 mg by mouth daily as needed.   Glucagon Emergency 1 MG Kit Inject 1 mg into the muscle once as needed for up to 1 dose.   HumaLOG KwikPen 200 UNIT/ML KwikPen Generic drug: insulin lispro Use up to 100 units daily in pump   insulin aspart 100 UNIT/ML injection Commonly known as: novoLOG Inject 200 units into pump   ketoconazole 2 % cream Commonly known as: NIZORAL Apply 1 application topically daily.   levothyroxine 150 MCG tablet Commonly known as: SYNTHROID TAKE 1 TABLET BY MOUTH EVERY DAY   Omnipod 5 G6 Intro (Gen 5) Kit Change pod every 3 days   Omnipod 5 G6 Pod (Gen 5) Misc Change pod every 2 days   rosuvastatin 10 MG tablet Commonly known as: CRESTOR Take 10 mg by mouth at bedtime.   sildenafil 100 MG tablet Commonly known as: VIAGRA TAKE 0.5-1 TABLETS (50-100 MG TOTAL) DAILY AS NEEDED FOR ERECTILE DYSFUNCTION. (6 PER 25 DAYS)   Syringe/Needle (Disp) 30G X 1/2" 1 ML Misc Use to inject insulin into to pump   [place] FlexTouch 100 UNIT/ML FlexTouch Pen Generic drug: insulin degludec Inject 40 Units into the skin daily.   triamcinolone cream 0.1 % Commonly known as: KENALOG Apply 1 application topically 2 (two) times daily.   Trulicity 1.5 NW/2.9FA Sopn Generic drug: Dulaglutide Inject weekly, start after 0.75 mg doses complete        Allergies:  Allergies  Allergen Reactions   Lisinopril Other (See Comments)   Loratadine Other (See Comments)   Losartan Potassium Other (See Comments)   Shellfish Allergy     Itchy throat    Past Medical History:  Diagnosis Date   Diabetes mellitus without complication ([address])    History of cataract    Hypothyroidism    SOBOE (shortness of  breath on exertion)    Swelling of both lower extremities    Thyroid disease     Past Surgical History:  Procedure Laterality Date   CATARACT EXTRACTION, BILATERAL  2021   WISDOM TOOTH EXTRACTION  2015    Family History  Adopted: Yes  Problem Relation Age of Onset   Colon polyps Maternal Uncle    Diabetes Neg Hx    Esophageal cancer Neg Hx    Inflammatory bowel disease Neg Hx    Liver disease Neg Hx    Pancreatic cancer Neg Hx    Rectal cancer Neg Hx    Stomach cancer Neg Hx    Colon cancer Neg Hx     Social History:  reports that he has never smoked. He has never used smokeless tobacco. He reports current alcohol use.  He reports that he does not use drugs.    Review of Systems:   Blood pressure: Normal without medications  BP Readings from Last 3 Encounters:  05/14/22 112/70  02/12/22 138/80  11/19/21 122/78     Lipids: With 10 mg Crestor his lipids are still high and has been recommended 20 mg  Lab Results  Component Value Date   CHOL 161 05/12/2022   HDL 47.90 05/12/2022   LDLCALC 96 05/12/2022   TRIG 87.0 05/12/2022   CHOLHDL 3 05/12/2022     Diabetes complications: ED  He has been told to have hypothyroidism and is now taking 150 mcg of levothyroxine  Lab Results  Component Value Date   TSH 3.18 08/27/2021   TSH 0.920 02/06/2021   TSH 4.30 07/25/2020   FREET4 1.44 02/06/2021   FREET4 0.99 07/25/2020   Unable to calculate Fibrosis 4 Score. Requires ALT, AST, and platelet count within the last 6 months.    Physical Examination:  BP 112/70   Pulse 73   Ht 5' 8.5" (1.74 m)   Wt 249 lb 12.8 oz (113.3 kg)   SpO2 98%   BMI 37.43 kg/m         ASSESSMENT/PLAN   Diabetes type 1 with inadequate control   Current treatment: OmniPod pump with NOVOLOG insulin  See history of present illness for detailed discussion of current diabetes management, blood sugar patterns and problems identified  As above his blood sugars on the CGM were related  in detail and his day-to-day management discussed Discussed his pump management, automated mode, definition of various parameters on the pump Also discussed that given his previous history he is likely needing much more bolus insulin than basal insulin Currently basal insulin has been averaging about 20 units a day with the pump suspending basal frequently yesterday during the day indicating overestimation of his required basal settings Also with the current insulin usage he is able to continue his initial insulin pod for up to 3 days                      Recommendations:   He will change his carbohydrate coverage to 1: 3 after 5 PM to cover his evening meals better, he was able to program this while in the office  Discussed need to try and BOLUS ahead of time before starting to eat especially in the evenings He can try to keep his overnight target also at 110 since he tends to have some high readings in the early part of the night -They will let us know if he has low sugars overnight Likely may need extra insulin for any higher fat meals or correction boluses if needed after dinner Encouraged him to be active with exercise He can try ACTOS 15 mg daily for insulin sensitivity as discussed below If he has any tendency to weight gain may consider restarting Trulicity or even Mounjaro Continue using his U-200 insulin  Follow-up in 3 months  LIPIDS: LDL below 100  LIVER function abnormality: This appears to be isolated and not related to alcohol intake He does have known hepatic steatosis on previous ultrasound but fibrosis score could not be calculated today because of lack of complete data Most likely considering his BMI he has hepatic steatosis causing the abnormal liver functions but will forward his results to his gastroenterologist He agrees to try PIOGLITAZONE 15 mg as this is the only proven treatment for hepatic steatosis and follow-up liver functions on next  visit   Elayne Snare 05/14/2022, 10:32 AM    Total visit time for his diabetes management, evaluation of his pump and CGM activity, review of other related problems as above and counseling = 40 minutes     Elayne Snare

## 2022-05-14 NOTE — Patient Instructions (Addendum)
Glucose target 110 all day  Carb ratio 1:3 at 5-12 am  Bolus before meals   Pioglitazone 1 daily

## 2022-05-16 ENCOUNTER — Encounter: Payer: Self-pay | Admitting: Endocrinology

## 2022-05-19 ENCOUNTER — Ambulatory Visit: Payer: BC Managed Care – PPO | Admitting: Orthopaedic Surgery

## 2022-05-20 ENCOUNTER — Encounter: Payer: Self-pay | Admitting: Gastroenterology

## 2022-05-20 ENCOUNTER — Ambulatory Visit: Payer: BC Managed Care – PPO | Admitting: Gastroenterology

## 2022-05-20 ENCOUNTER — Other Ambulatory Visit (INDEPENDENT_AMBULATORY_CARE_PROVIDER_SITE_OTHER): Payer: BC Managed Care – PPO

## 2022-05-20 VITALS — BP 120/72 | HR 65 | Ht 68.0 in | Wt 252.0 lb

## 2022-05-20 DIAGNOSIS — K529 Noninfective gastroenteritis and colitis, unspecified: Secondary | ICD-10-CM | POA: Diagnosis not present

## 2022-05-20 DIAGNOSIS — R140 Abdominal distension (gaseous): Secondary | ICD-10-CM

## 2022-05-20 DIAGNOSIS — R194 Change in bowel habit: Secondary | ICD-10-CM

## 2022-05-20 DIAGNOSIS — R7989 Other specified abnormal findings of blood chemistry: Secondary | ICD-10-CM

## 2022-05-20 DIAGNOSIS — R7401 Elevation of levels of liver transaminase levels: Secondary | ICD-10-CM

## 2022-05-20 DIAGNOSIS — R1084 Generalized abdominal pain: Secondary | ICD-10-CM

## 2022-05-20 DIAGNOSIS — K760 Fatty (change of) liver, not elsewhere classified: Secondary | ICD-10-CM

## 2022-05-20 DIAGNOSIS — R197 Diarrhea, unspecified: Secondary | ICD-10-CM

## 2022-05-20 LAB — COMPREHENSIVE METABOLIC PANEL
ALT: 38 U/L (ref 0–53)
AST: 24 U/L (ref 0–37)
Albumin: 4.2 g/dL (ref 3.5–5.2)
Alkaline Phosphatase: 78 U/L (ref 39–117)
BUN: 12 mg/dL (ref 6–23)
CO2: 26 mEq/L (ref 19–32)
Calcium: 9.3 mg/dL (ref 8.4–10.5)
Chloride: 101 mEq/L (ref 96–112)
Creatinine, Ser: 1.16 mg/dL (ref 0.40–1.50)
GFR: 77.44 mL/min (ref >60.00)
Glucose, Bld: 143 mg/dL — ABNORMAL HIGH (ref 70–99)
Potassium: 4.4 mEq/L (ref 3.5–5.1)
Sodium: 138 mEq/L (ref 135–145)
Total Bilirubin: 0.5 mg/dL (ref 0.2–1.2)
Total Protein: 7.4 g/dL (ref 6.0–8.3)

## 2022-05-20 LAB — GAMMA GT: GGT: 42 U/L (ref 7–51)

## 2022-05-20 LAB — IBC + FERRITIN
Ferritin: 65.7 ng/mL (ref 22.0–322.0)
Iron: 97 ug/dL (ref 42–165)
Saturation Ratios: 29.2 % (ref 20.0–50.0)
TIBC: 331.8 ug/dL (ref 250.0–450.0)
Transferrin: 237 mg/dL (ref 212.0–360.0)

## 2022-05-20 LAB — CK: Total CK: 196 U/L (ref 7–232)

## 2022-05-20 MED ORDER — NA SULFATE-K SULFATE-MG SULF 17.5-3.13-1.6 GM/177ML PO SOLN: 1.0000 | Freq: Once | ORAL | 0 refills | Status: AC

## 2022-05-20 NOTE — Patient Instructions (Addendum)
   If you are age 43 or older, your body mass index should be between 23-30. Your Body mass index is 38.32 kg/m. If this is out of the aforementioned range listed, please consider follow up with your Primary Care Provider.  If you are age 28 or younger, your body mass index should be between 19-25. Your Body mass index is 38.32 kg/m. If this is out of the aformentioned range listed, please consider follow up with your Primary Care Provider.   ________________________________________________________  The [facility] providers would like to encourage you to use [facility] to communicate with providers for non-urgent requests or questions.  Due to long hold times on the telephone, sending your provider a message by [facility] may be a faster and more efficient way to get a response.  Please allow 48 business hours for a response.  Please remember that this is for non-urgent requests.  _______________________________________________________  Your provider has requested that you go to the basement level for lab work before leaving today. Press "B" on the elevator. The lab is located at the first door on the left as you exit the elevator.  You have been scheduled for an endoscopy and colonoscopy. Please follow the written instructions given to you at your visit today. Please pick up your prep supplies at the pharmacy within the next 1-3 days. If you use inhalers (even only as needed), please bring them with you on the day of your procedure.  Due to recent changes in healthcare laws, you may see the results of your imaging and laboratory studies on MyChart before your provider has had a chance to review them.  We understand that in some cases there may be results that are confusing or concerning to you. Not all laboratory results come back in the same time frame and the provider may be waiting for multiple results in order to interpret others.  Please give [place] 48 hours in order for your provider to thoroughly  review all the results before contacting the office for clarification of your results.   It was a pleasure to see you today!  Thank you for trusting me with your gastrointestinal care!

## 2022-05-21 LAB — HELICOBACTER PYLORI  SPECIAL ANTIGEN
MICRO NUMBER:: 13846823
SPECIMEN QUALITY: ADEQUATE

## 2022-05-23 ENCOUNTER — Encounter: Payer: Self-pay | Admitting: Gastroenterology

## 2022-05-23 ENCOUNTER — Encounter: Payer: Self-pay | Admitting: Endocrinology

## 2022-05-23 DIAGNOSIS — R7401 Elevation of levels of liver transaminase levels: Secondary | ICD-10-CM | POA: Insufficient documentation

## 2022-05-23 DIAGNOSIS — K760 Fatty (change of) liver, not elsewhere classified: Secondary | ICD-10-CM | POA: Insufficient documentation

## 2022-05-23 DIAGNOSIS — R194 Change in bowel habit: Secondary | ICD-10-CM | POA: Insufficient documentation

## 2022-05-23 DIAGNOSIS — R197 Diarrhea, unspecified: Secondary | ICD-10-CM | POA: Insufficient documentation

## 2022-05-23 DIAGNOSIS — R1084 Generalized abdominal pain: Secondary | ICD-10-CM | POA: Insufficient documentation

## 2022-05-23 DIAGNOSIS — R140 Abdominal distension (gaseous): Secondary | ICD-10-CM | POA: Insufficient documentation

## 2022-05-23 DIAGNOSIS — R7989 Other specified abnormal findings of blood chemistry: Secondary | ICD-10-CM | POA: Insufficient documentation

## 2022-05-23 LAB — IMMUNOGLOBULINS A/E/G/M, SERUM
IgA/Immunoglobulin A, Serum: 351 mg/dL (ref 90–386)
IgE (Immunoglobulin E), Serum: 418 IU/mL (ref 6–495)
IgG (Immunoglobin G), Serum: 1099 mg/dL (ref 603–1613)
IgM (Immunoglobulin M), Srm: 75 mg/dL (ref 20–172)

## 2022-05-23 NOTE — Progress Notes (Signed)
[facility] VISIT   Primary Care Provider Seward Carol, MD [address] [place] [address] [address] [phone]   Patient Profile: Andre Brown is a 41 y.o. male with a pmh significant for insulin-dependent diabetes, hypothyroidism, hyperlipidemia, fatty liver (imaging based).  The patient presents to the [facility] for an evaluation and management of problem(s) noted below:  Problem List 1. Bloating   2. Distended abdomen   3. Change in bowel habit   4. Generalized abdominal pain   5. Chronic diarrhea   6. Fatty liver   7. Elevated LFTs   8. Elevated AST (SGOT)     History of Present Illness Please see prior notes for full details of HPI.  Interval History The patient returns for follow-up.  He recently underwent laboratory work-up that showed a slight elevation in his ALT and although his AST was still within the normal range, it had increased from prior.  He recently saw his endocrinologist and has had some adjustments in his insulin.  There was consideration of initiating pioglitazone but the patient has not started that until he had an opportunity to talk with [place] further.  Patient's previous bowel habits that had been slightly improved by the time we had met him in December have unfortunately recurred.  He is also having some bloating and distention as well as abdominal discomfort.  He is interested at this point in working up this further.  He is also interested in further working up the abnormal liver tests and the fatty liver that was noted on imaging that we performed back in December.  He still notices that foods can cause some issues for him in regards to diarrheal movements but he tries to stay away from those.  As he is getting older, he wants to ensure that he is not missing anything in regards to his overall health.  As previously noted he does not have a history of a prior upper or lower endoscopy.  GI  Review of Systems Positive as above Negative for dysphagia, odynophagia, melena, hematochezia  Review of Systems General: Denies fevers/chills/weight loss unintentionally Cardiovascular: Denies chest pain Pulmonary: Denies shortness of breath Gastroenterological: See HPI Genitourinary: Denies darkened urine Hematological: Denies easy bruising/bleeding Dermatological: Denies jaundice Psychological: Mood is stable   Medications Current Outpatient Medications  Medication Sig Dispense Refill   B-D ULTRAFINE III SHORT PEN 31G X 8 MM MISC USE 4 TIMES A DAY 400 each 0   Continuous Blood Gluc Sensor (DEXCOM G6 SENSOR) MISC 3 each by Other route See admin instructions. Apply one sensor to body once every 10 days; E11.9 9 each 12   Continuous Blood Gluc Transmit (DEXCOM G6 TRANSMITTER) MISC USE AS DIRECTED 1 each 3   furosemide (LASIX) 20 MG tablet Take 20 mg by mouth daily as needed.     glucagon (GLUCAGON EMERGENCY) 1 MG injection Inject 1 mg into the muscle once as needed for up to 1 dose. 1 each 12   insulin aspart (NOVOLOG) 100 UNIT/ML injection Inject 200 units into pump 180 mL 3   insulin degludec (TRESIBA FLEXTOUCH) 100 UNIT/ML FlexTouch Pen Inject 40 Units into the skin daily. 45 mL 3   Insulin Disposable Pump (OMNIPOD 5 G6 INTRO, GEN 5,) KIT Change pod every 3 days 1 kit 0   Insulin Disposable Pump (OMNIPOD 5 G6 POD, GEN 5,) MISC Change pod every 2 days 90 each 3   insulin lispro (HUMALOG KWIKPEN) 200 UNIT/ML KwikPen Use up  to 100 units daily in pump 90 mL 3   ketoconazole (NIZORAL) 2 % cream Apply 1 application topically daily. 15 g 0   levothyroxine (SYNTHROID) 150 MCG tablet TAKE 1 TABLET BY MOUTH EVERY DAY 90 tablet 3   pioglitazone (ACTOS) 15 MG tablet Take 1 tablet (15 mg total) by mouth daily. 30 tablet 2   rosuvastatin (CRESTOR) 10 MG tablet Take 10 mg by mouth at bedtime.     sildenafil (VIAGRA) 100 MG tablet TAKE 0.5-1 TABLETS (50-100 MG TOTAL) DAILY AS NEEDED FOR ERECTILE  DYSFUNCTION. (6 PER 25 DAYS) 6 tablet 3   Syringe/Needle, Disp, 30G X 1/2" 1 ML MISC Use to inject insulin into to pump 100 each 2   triamcinolone cream (KENALOG) 0.1 % Apply 1 application topically 2 (two) times daily. 30 g 0   Dulaglutide (TRULICITY) 4.5 KD/9.8PJ SOPN INJECT 4.5 MG AS DIRECTED ONCE A WEEK.     No current facility-administered medications for this visit.    Allergies Allergies  Allergen Reactions   Lisinopril Other (See Comments)   Loratadine Other (See Comments)   Losartan Potassium Other (See Comments)   Shellfish Allergy     Itchy throat    Histories Past Medical History:  Diagnosis Date   Diabetes mellitus without complication ([place])    History of cataract    Hypothyroidism    SOBOE (shortness of breath on exertion)    Swelling of both lower extremities    Thyroid disease    Past Surgical History:  Procedure Laterality Date   CATARACT EXTRACTION, BILATERAL  2021   WISDOM TOOTH EXTRACTION  2015   Social History   Socioeconomic History   Marital status: Married    Spouse name: Deborah Dondero   Number of children: 2   Years of education: Not on file   Highest education level: Not on file  Occupational History   Occupation: Lawyer  Tobacco Use   Smoking status: Never   Smokeless tobacco: Never  Substance and Sexual Activity   Alcohol use: Yes   Drug use: No   Sexual activity: Not on file  Other Topics Concern   Not on file  Social History Narrative   Not on file   Social Determinants of Health   Financial Resource Strain: Not on file  Food Insecurity: Not on file  Transportation Needs: Not on file  Physical Activity: Not on file  Stress: Not on file  Social Connections: Not on file  Intimate Partner Violence: Not on file   Family History  Adopted: Yes  Problem Relation Age of Onset   Colon polyps Maternal Uncle    Diabetes Neg Hx    Esophageal cancer Neg Hx    Inflammatory bowel disease Neg Hx    Liver disease Neg Hx     Pancreatic cancer Neg Hx    Rectal cancer Neg Hx    Stomach cancer Neg Hx    Colon cancer Neg Hx    I have reviewed his medical, social, and family history in detail and updated the electronic medical record as necessary.    PHYSICAL EXAMINATION  BP 120/72   Pulse 65   Ht _0  (1.727 m)   Wt 252 lb (114.3 kg)   SpO2 99%   BMI 38.32 kg/m  Wt Readings from Last 3 Encounters:  05/20/22 252 lb (114.3 kg)  05/14/22 249 lb 12.8 oz (113.3 kg)  02/12/22 247 lb (112 kg)  GEN: NAD, appears stated age, doesn't appear chronically ill  PSYCH: Cooperative, without pressured speech EYE: Conjunctivae pink, sclerae anicteric ENT: MMM CV: Nontachycardic RESP: No audible wheezing GI: NABS, soft, NT/ND, protuberant abdomen, rounded, without rebound MSK/EXT: No significant lower extremity edema SKIN: No jaundice NEURO:  Alert & Oriented x 3, no focal deficits   REVIEW OF DATA  I reviewed the following data at the time of this encounter:  GI Procedures and Studies  No relevant studies to review  Laboratory Studies  Reviewed those in epic  Imaging Studies  December 2022 abdominal ultrasound IMPRESSION: 1. No acute abnormality. 2. Hepatic steatosis.   ASSESSMENT  Mr. Rhodes is a 41 y.o. male with a pmh significant for insulin-dependent diabetes, hypothyroidism, hyperlipidemia, fatty liver (imaging based).  The patient is seen today for evaluation and management of:  1. Bloating   2. Distended abdomen   3. Change in bowel habit   4. Generalized abdominal pain   5. Chronic diarrhea   6. Fatty liver   7. Elevated LFTs   8. Elevated AST (SGOT)    The patient remains hemodynamically stable.  Unfortunately his altered bowel habits have recurred once again.  We rule out multiple etiologies with initial round of work-up but as symptoms have recurred and are persisting at this point endoscopic evaluation is the next step in his evaluation.  We recommend both an upper and lower  evaluation to evaluate his symptoms.  With his abnormal LFTs, additional serological work-up is required.  Unless the patient has a persisting elevation in his transferase is her liver enzymes for 6 to 12 months or things become rapidly worse, I do not think a liver biopsy will be needed.  The possibility of metabolic associated steatohepatitis should be considered.  We went over the risk factors of fatty liver disease and have discussed the goals of risk factor reduction including improving diabetes control, maintaining good blood pressure, and weight loss being the major things that can be done if fatty liver disease is present.   PLAN  We will obtain H. pylori stool antigen testing Proceed with laboratories as outlined below from a liver biochemical/serological work-up No plan for liver biopsy for now EGD/colonoscopy to be scheduled for diagnostic evaluation of his chronic diarrhea symptoms, generalized abdominal pain symptoms and changes in bowel habits If patient is still experiencing significant abdominal pain/discomfort with negative work-up and will consider CT scan Risk factor reduction for metabolic associated steatohepatitis/fatty liver disease was discussed continue to work with his endocrinologist and PCP about this (diabetes control/blood pressure control/weight loss) No contraindication from my standpoint for further diabetic medications as per endocrinology   Orders Placed This Encounter  Procedures   Helicobacter pylori special antigen   Comprehensive metabolic panel   IBC + Ferritin   Antinuclear Antib (ANA)   Anti-smooth muscle antibody, IgG   Mitochondrial antibodies   Ceruloplasmin   Immunoglobulins, QN, A/E/G/M   Hepatitis A Ab, Total   Hepatitis B Surface AntiBODY   Hepatitis B Surface AntiGEN   Hepatitis B core antibody, total   CK (Creatine Kinase)   Gamma GT   Hepatitis C Antibody   Ambulatory referral to Gastroenterology    New Prescriptions   No  medications on file   Modified Medications   No medications on file    Planned Follow Up No follow-ups on file.   Total Time in Face-to-Face and in Coordination of Care for patient including independent/personal interpretation/review of prior testing, medical history, examination, medication adjustment, communicating results with the patient directly, and documentation within  the EHR is 25 minutes.   Justice Britain, MD [facility] Office # [phone]

## 2022-05-24 ENCOUNTER — Other Ambulatory Visit: Payer: Self-pay | Admitting: Endocrinology

## 2022-05-24 MED ORDER — SILDENAFIL CITRATE 100 MG PO TABS: ORAL_TABLET | ORAL | 3 refills | Status: AC

## 2022-05-25 LAB — ANA: Anti Nuclear Antibody (ANA): NEGATIVE

## 2022-05-25 LAB — HEPATITIS B SURFACE ANTIGEN: Hepatitis B Surface Ag: NONREACTIVE

## 2022-05-25 LAB — HEPATITIS C ANTIBODY: Hepatitis C Ab: NONREACTIVE

## 2022-05-25 LAB — HEPATITIS A ANTIBODY, TOTAL: Hepatitis A AB,Total: REACTIVE — AB

## 2022-05-25 LAB — CERULOPLASMIN: Ceruloplasmin: 30 mg/dL (ref 18–36)

## 2022-05-25 LAB — HEPATITIS B CORE ANTIBODY, TOTAL: Hep B Core Total Ab: NONREACTIVE

## 2022-05-25 LAB — MITOCHONDRIAL ANTIBODIES: Mitochondrial M2 Ab, IgG: <=20 U (ref <=20.0)

## 2022-05-25 LAB — HEPATITIS B SURFACE ANTIBODY,QUALITATIVE: Hep B S Ab: NONREACTIVE

## 2022-06-21 ENCOUNTER — Other Ambulatory Visit: Payer: Self-pay | Admitting: Orthopaedic Surgery

## 2022-06-27 ENCOUNTER — Encounter: Payer: Self-pay | Admitting: Gastroenterology

## 2022-06-27 ENCOUNTER — Telehealth: Payer: Self-pay | Admitting: Gastroenterology

## 2022-06-27 NOTE — Telephone Encounter (Signed)
Patient called states he would to know his

## 2022-06-28 ENCOUNTER — Encounter: Payer: Self-pay | Admitting: Certified Registered Nurse Anesthetist

## 2022-07-04 ENCOUNTER — Encounter: Payer: Self-pay | Admitting: Gastroenterology

## 2022-07-04 ENCOUNTER — Ambulatory Visit (AMBULATORY_SURGERY_CENTER): Payer: BC Managed Care – PPO | Admitting: Gastroenterology

## 2022-07-04 VITALS — BP 115/78 | HR 81 | Temp 98.0°F | Resp 16 | Ht 68.0 in | Wt 252.0 lb

## 2022-07-04 DIAGNOSIS — K319 Disease of stomach and duodenum, unspecified: Secondary | ICD-10-CM | POA: Diagnosis not present

## 2022-07-04 DIAGNOSIS — R140 Abdominal distension (gaseous): Secondary | ICD-10-CM

## 2022-07-04 DIAGNOSIS — K529 Noninfective gastroenteritis and colitis, unspecified: Secondary | ICD-10-CM | POA: Diagnosis not present

## 2022-07-04 DIAGNOSIS — K2990 Gastroduodenitis, unspecified, without bleeding: Secondary | ICD-10-CM | POA: Diagnosis not present

## 2022-07-04 DIAGNOSIS — R194 Change in bowel habit: Secondary | ICD-10-CM

## 2022-07-04 DIAGNOSIS — K641 Second degree hemorrhoids: Secondary | ICD-10-CM | POA: Diagnosis not present

## 2022-07-04 DIAGNOSIS — K2950 Unspecified chronic gastritis without bleeding: Secondary | ICD-10-CM | POA: Diagnosis not present

## 2022-07-04 DIAGNOSIS — K2970 Gastritis, unspecified, without bleeding: Secondary | ICD-10-CM

## 2022-07-04 DIAGNOSIS — K649 Unspecified hemorrhoids: Secondary | ICD-10-CM

## 2022-07-04 MED ORDER — SODIUM CHLORIDE 0.9 % IV SOLN: 500.0000 mL | Freq: Once | INTRAVENOUS | Status: DC

## 2022-07-04 NOTE — Progress Notes (Signed)
1058 Robinul 0.1 mg IV given due large amount of secretions upon assessment.  MD made aware, vss 

## 2022-07-04 NOTE — Patient Instructions (Signed)
Resume previous medications.  Biopsies taken.  Await results for final recommendations.    Handouts on findings given to patient.   (Hemorrhoids)  High fiber diet recommended and Benefiber 1 teaspoon twice a day.    YOU HAD AN ENDOSCOPIC PROCEDURE TODAY AT [facility]:   Refer to the procedure report that was given to you for any specific questions about what was found during the examination.  If the procedure report does not answer your questions, please call your gastroenterologist to clarify.  If you requested that your care partner not be given the details of your procedure findings, then the procedure report has been included in a sealed envelope for you to review at your convenience later.  YOU SHOULD EXPECT: Some feelings of bloating in the abdomen. Passage of more gas than usual.  Walking can help get rid of the air that was put into your GI tract during the procedure and reduce the bloating. If you had a lower endoscopy (such as a colonoscopy or flexible sigmoidoscopy) you may notice spotting of blood in your stool or on the toilet paper. If you underwent a bowel prep for your procedure, you may not have a normal bowel movement for a few days.  Please Note:  You might notice some irritation and congestion in your nose or some drainage.  This is from the oxygen used during your procedure.  There is no need for concern and it should clear up in a day or so.  SYMPTOMS TO REPORT IMMEDIATELY:  Following lower endoscopy (colonoscopy or flexible sigmoidoscopy):  Excessive amounts of blood in the stool  Significant tenderness or worsening of abdominal pains  Swelling of the abdomen that is new, acute  Fever of 100F or higher  Following upper endoscopy (EGD)  Vomiting of blood or coffee ground material  New chest pain or pain under the shoulder blades  Painful or persistently difficult swallowing  New shortness of breath  Fever of 100F or higher  Black, tarry-looking  stools  For urgent or emergent issues, a gastroenterologist can be reached at any hour by calling [phone]. Do not use MyChart messaging for urgent concerns.    DIET:  We do recommend a small meal at first, but then you may proceed to your regular diet.  Drink plenty of fluids but you should avoid alcoholic beverages for 24 hours.  ACTIVITY:  You should plan to take it easy for the rest of today and you should NOT DRIVE or use heavy machinery until tomorrow (because of the sedation medicines used during the test).    FOLLOW UP: Our staff will call the number listed on your records the next business day following your procedure.  We will call around 7:15- 8:00 am to check on you and address any questions or concerns that you may have regarding the information given to you following your procedure. If we do not reach you, we will leave a message.     If any biopsies were taken you will be contacted by phone or by letter within the next 1-3 weeks.  Please call us at [phone] if you have not heard about the biopsies in 3 weeks.    SIGNATURES/CONFIDENTIALITY: You and/or your care partner have signed paperwork which will be entered into your electronic medical record.  These signatures attest to the fact that that the information above on your After Visit Summary has been reviewed and is understood.  Full responsibility of the confidentiality of this  discharge information lies with you and/or your care-partner.  

## 2022-07-04 NOTE — Op Note (Signed)
[facility] Patient Name: Andre Brown Procedure Date: 07/04/2022 10:55 AM MRN: [id number] Endoscopist: Justice Britain , MD Age: 41 Referring MD:  Date of Birth: 09/12/1981 Gender: Male Account #: [id number] Procedure:                Upper GI endoscopy Indications:              Epigastric abdominal pain, Abdominal bloating,                            Diarrhea Medicines:                Monitored Anesthesia Care Procedure:                Pre-Anesthesia Assessment:                           - Prior to the procedure, a History and Physical                            was performed, and patient medications and                            allergies were reviewed. The patient's tolerance of                            previous anesthesia was also reviewed. The risks                            and benefits of the procedure and the sedation                            options and risks were discussed with the patient.                            All questions were answered, and informed consent                            was obtained. Prior Anticoagulants: The patient has                            taken no previous anticoagulant or antiplatelet                            agents. ASA Grade Assessment: II - A patient with                            mild systemic disease. After reviewing the risks                            and benefits, the patient was deemed in                            satisfactory condition to undergo the procedure.  After obtaining informed consent, the endoscope was                            passed under direct vision. Throughout the                            procedure, the patient's blood pressure, pulse, and                            oxygen saturations were monitored continuously. The                            GIF [id number] #[id number] was introduced through the                            mouth, and advanced to the second part of  duodenum.                            The upper GI endoscopy was accomplished without                            difficulty. The patient tolerated the procedure. Scope In: Scope Out: Findings:                 No gross lesions were noted in the entire esophagus.                           The Z-line was regular and was found 40 cm from the                            incisors.                           Patchy mildly erythematous mucosa without bleeding                            was found in the entire examined stomach. Biopsies                            were taken with a cold forceps for histology and                            Helicobacter pylori testing.                           No gross lesions were noted in the duodenal bulb,                            in the first portion of the duodenum and in the                            second portion of the duodenum. Biopsies were taken  with a cold forceps for histology and rule out                            enteropathy. Complications:            No immediate complications. Estimated Blood Loss:     Estimated blood loss was minimal. Impression:               - No gross lesions in esophagus. Z-line regular, 40                            cm from the incisors.                           - Erythematous mucosa in the stomach. Biopsied.                           - No gross lesions in the duodenal bulb, in the                            first portion of the duodenum and in the second                            portion of the duodenum. Biopsied. Recommendation:           - Proceed to scheduled colonoscopy.                           - Continue present medications.                           - Await pathology results.                           - The findings and recommendations were discussed                            with the patient.                           - The findings and recommendations were discussed                             with the patient's family. Justice Britain, MD 07/04/2022 11:22:19 AM

## 2022-07-04 NOTE — Progress Notes (Signed)
Pt's states no medical or surgical changes since previsit or office visit. 

## 2022-07-04 NOTE — Op Note (Signed)
[facility] Patient Name: Andre Brown Procedure Date: 07/04/2022 10:55 AM MRN: [id number] Endoscopist: Justice Britain , MD Age: 41 Referring MD:  Date of Birth: [birth year] Gender: Male Account #: [id number] Procedure:                Colonoscopy Indications:              Epigastric abdominal pain, Chronic diarrhea, Change                            in bowel habits Medicines:                Monitored Anesthesia Care Procedure:                Pre-Anesthesia Assessment:                           - Prior to the procedure, a History and Physical                            was performed, and patient medications and                            allergies were reviewed. The patient's tolerance of                            previous anesthesia was also reviewed. The risks                            and benefits of the procedure and the sedation                            options and risks were discussed with the patient.                            All questions were answered, and informed consent                            was obtained. Prior Anticoagulants: The patient has                            taken no previous anticoagulant or antiplatelet                            agents. ASA Grade Assessment: II - A patient with                            mild systemic disease. After reviewing the risks                            and benefits, the patient was deemed in                            satisfactory condition to undergo the procedure.  After obtaining informed consent, the colonoscope                            was passed under direct vision. Throughout the                            procedure, the patient's blood pressure, pulse, and                            oxygen saturations were monitored continuously. The                            CF [id number] #[id number] was introduced through the anus                            and advanced to the 5 cm into the  ileum. The                            colonoscopy was performed without difficulty. The                            patient tolerated the procedure. The quality of the                            bowel preparation was good. The terminal ileum,                            ileocecal valve, appendiceal orifice, and rectum                            were photographed. Scope In: 11:08:43 AM Scope Out: 11:18:25 AM Scope Withdrawal Time: 0 hours 7 minutes 50 seconds  Total Procedure Duration: 0 hours 9 minutes 42 seconds  Findings:                 The digital rectal exam findings include                            hemorrhoids. Pertinent negatives include no                            palpable rectal lesions.                           The terminal ileum and ileocecal valve appeared                            normal.                           Normal mucosa was found in the entire colon.                            Biopsies for histology were taken with a cold  forceps from the entire colon for evaluation of                            microscopic colitis.                           Non-bleeding non-thrombosed internal hemorrhoids                            were found during retroflexion, during perianal                            exam and during digital exam. The hemorrhoids were                            Grade II (internal hemorrhoids that prolapse but                            reduce spontaneously). Complications:            No immediate complications. Estimated Blood Loss:     Estimated blood loss was minimal. Impression:               - Hemorrhoids found on digital rectal exam.                           - The examined portion of the ileum was normal.                           - Normal mucosa in the entire examined colon.                            Biopsied.                           - Non-bleeding non-thrombosed internal hemorrhoids. Recommendation:           - The  patient will be observed post-procedure,                            until all discharge criteria are met.                           - Discharge patient to home.                           - Patient has a contact number available for                            emergencies. The signs and symptoms of potential                            delayed complications were discussed with the                            patient. Return to normal activities tomorrow.  Written discharge instructions were provided to the                            patient.                           - High fiber diet.                           - Use Benefiber one teaspoon PO BID.                           - Continue present medications.                           - Await pathology results.                           - Repeat colonoscopy in 10 years for screening                            purposes.                           - If issues of abdominal discomfort persist,                            consider cross-sectional imaging with CT abdomen                            pelvis with IV and oral contrast.                           - If issues of diarrhea recur, consider SIBO breath                            testing.                           - The findings and recommendations were discussed                            with the patient.                           - The findings and recommendations were discussed                            with the patient's family. Justice Britain, MD 07/04/2022 11:26:36 AM

## 2022-07-04 NOTE — Progress Notes (Signed)
Called to room to assist during endoscopic procedure.  Patient ID and intended procedure confirmed with present staff. Received instructions for my participation in the procedure from the performing physician.  

## 2022-07-04 NOTE — Progress Notes (Signed)
1113 HR > 100 with esmolol 25 mg given IV, MD updated, vss

## 2022-07-04 NOTE — Progress Notes (Signed)
GASTROENTEROLOGY PROCEDURE H&P NOTE   Primary Care Physician: Seward Carol, MD  HPI: Andre Brown is a 41 y.o. male who presents for Colonoscopy/EGD for evaluation of bloating, distention, change in bowel habits, diarrhea, pain.  Past Medical History:  Diagnosis Date   Diabetes mellitus without complication ([place])    History of cataract    Hypothyroidism    SOBOE (shortness of breath on exertion)    Swelling of both lower extremities    Thyroid disease    Past Surgical History:  Procedure Laterality Date   CATARACT EXTRACTION, BILATERAL  2021   WISDOM TOOTH EXTRACTION  2015   Current Outpatient Medications  Medication Sig Dispense Refill   B-D ULTRAFINE III SHORT PEN 31G X 8 MM MISC USE 4 TIMES A DAY 400 each 0   Continuous Blood Gluc Sensor (DEXCOM G6 SENSOR) MISC 3 each by Other route See admin instructions. Apply one sensor to body once every 10 days; E11.9 9 each 12   Continuous Blood Gluc Transmit (DEXCOM G6 TRANSMITTER) MISC USE AS DIRECTED 1 each 3   diclofenac (VOLTAREN) 75 MG EC tablet TAKE 1 TABLET (75 MG TOTAL) BY MOUTH 2 (TWO) TIMES DAILY BETWEEN MEALS AS NEEDED. 60 tablet 1   Dulaglutide (TRULICITY) 4.5 BS/9.6GE SOPN INJECT 4.5 MG AS DIRECTED ONCE A WEEK.     furosemide (LASIX) 20 MG tablet Take 20 mg by mouth daily as needed.     glucagon (GLUCAGON EMERGENCY) 1 MG injection Inject 1 mg into the muscle once as needed for up to 1 dose. 1 each 12   insulin aspart (NOVOLOG) 100 UNIT/ML injection Inject 200 units into pump 180 mL 3   insulin degludec (TRESIBA FLEXTOUCH) 100 UNIT/ML FlexTouch Pen Inject 40 Units into the skin daily. 45 mL 3   Insulin Disposable Pump (OMNIPOD 5 G6 INTRO, GEN 5,) KIT Change pod every 3 days 1 kit 0   Insulin Disposable Pump (OMNIPOD 5 G6 POD, GEN 5,) MISC Change pod every 2 days 90 each 3   insulin lispro (HUMALOG KWIKPEN) 200 UNIT/ML KwikPen Use up to 100 units daily in pump 90 mL 3   ketoconazole (NIZORAL) 2 % cream Apply 1  application topically daily. 15 g 0   levothyroxine (SYNTHROID) 150 MCG tablet TAKE 1 TABLET BY MOUTH EVERY DAY 90 tablet 3   pioglitazone (ACTOS) 15 MG tablet Take 1 tablet (15 mg total) by mouth daily. 30 tablet 2   rosuvastatin (CRESTOR) 10 MG tablet Take 10 mg by mouth at bedtime.     sildenafil (VIAGRA) 100 MG tablet TAKE 0.5-1 TABLETS (50-100 MG TOTAL) DAILY AS NEEDED FOR ERECTILE DYSFUNCTION. (6 PER 25 DAYS) 10 tablet 3   Syringe/Needle, Disp, 30G X 1/2" 1 ML MISC Use to inject insulin into to pump 100 each 2   triamcinolone cream (KENALOG) 0.1 % Apply 1 application topically 2 (two) times daily. 30 g 0   No current facility-administered medications for this visit.    Current Outpatient Medications:    B-D ULTRAFINE III SHORT PEN 31G X 8 MM MISC, USE 4 TIMES A DAY, Disp: 400 each, Rfl: 0   Continuous Blood Gluc Sensor (DEXCOM G6 SENSOR) MISC, 3 each by Other route See admin instructions. Apply one sensor to body once every 10 days; E11.9, Disp: 9 each, Rfl: 12   Continuous Blood Gluc Transmit (DEXCOM G6 TRANSMITTER) MISC, USE AS DIRECTED, Disp: 1 each, Rfl: 3   diclofenac (VOLTAREN) 75 MG EC tablet, TAKE 1 TABLET (75  MG TOTAL) BY MOUTH 2 (TWO) TIMES DAILY BETWEEN MEALS AS NEEDED., Disp: 60 tablet, Rfl: 1   Dulaglutide (TRULICITY) 4.5 RA/0.7MA SOPN, INJECT 4.5 MG AS DIRECTED ONCE A WEEK., Disp: , Rfl:    furosemide (LASIX) 20 MG tablet, Take 20 mg by mouth daily as needed., Disp: , Rfl:    glucagon (GLUCAGON EMERGENCY) 1 MG injection, Inject 1 mg into the muscle once as needed for up to 1 dose., Disp: 1 each, Rfl: 12   insulin aspart (NOVOLOG) 100 UNIT/ML injection, Inject 200 units into pump, Disp: 180 mL, Rfl: 3   insulin degludec (TRESIBA FLEXTOUCH) 100 UNIT/ML FlexTouch Pen, Inject 40 Units into the skin daily., Disp: 45 mL, Rfl: 3   Insulin Disposable Pump (OMNIPOD 5 G6 INTRO, GEN 5,) KIT, Change pod every 3 days, Disp: 1 kit, Rfl: 0   Insulin Disposable Pump (OMNIPOD 5 G6 POD, GEN  5,) MISC, Change pod every 2 days, Disp: 90 each, Rfl: 3   insulin lispro (HUMALOG KWIKPEN) 200 UNIT/ML KwikPen, Use up to 100 units daily in pump, Disp: 90 mL, Rfl: 3   ketoconazole (NIZORAL) 2 % cream, Apply 1 application topically daily., Disp: 15 g, Rfl: 0   levothyroxine (SYNTHROID) 150 MCG tablet, TAKE 1 TABLET BY MOUTH EVERY DAY, Disp: 90 tablet, Rfl: 3   pioglitazone (ACTOS) 15 MG tablet, Take 1 tablet (15 mg total) by mouth daily., Disp: 30 tablet, Rfl: 2   rosuvastatin (CRESTOR) 10 MG tablet, Take 10 mg by mouth at bedtime., Disp: , Rfl:    sildenafil (VIAGRA) 100 MG tablet, TAKE 0.5-1 TABLETS (50-100 MG TOTAL) DAILY AS NEEDED FOR ERECTILE DYSFUNCTION. (6 PER 25 DAYS), Disp: 10 tablet, Rfl: 3   Syringe/Needle, Disp, 30G X 1/2" 1 ML MISC, Use to inject insulin into to pump, Disp: 100 each, Rfl: 2   triamcinolone cream (KENALOG) 0.1 %, Apply 1 application topically 2 (two) times daily., Disp: 30 g, Rfl: 0 Allergies  Allergen Reactions   Lisinopril Other (See Comments)   Loratadine Other (See Comments)   Losartan Potassium Other (See Comments)   Shellfish Allergy     Itchy throat   Family History  Adopted: Yes  Problem Relation Age of Onset   Colon polyps Maternal Uncle    Diabetes Neg Hx    Esophageal cancer Neg Hx    Inflammatory bowel disease Neg Hx    Liver disease Neg Hx    Pancreatic cancer Neg Hx    Rectal cancer Neg Hx    Stomach cancer Neg Hx    Colon cancer Neg Hx    Social History   Socioeconomic History   Marital status: Married    Spouse name: Gaither Biehn   Number of children: 2   Years of education: Not on file   Highest education level: Not on file  Occupational History   Occupation: Lawyer  Tobacco Use   Smoking status: Never   Smokeless tobacco: Never  Substance and Sexual Activity   Alcohol use: Yes   Drug use: No   Sexual activity: Not on file  Other Topics Concern   Not on file  Social History Narrative   Not on file   Social  Determinants of Health   Financial Resource Strain: Not on file  Food Insecurity: Not on file  Transportation Needs: Not on file  Physical Activity: Not on file  Stress: Not on file  Social Connections: Not on file  Intimate Partner Violence: Not on file  Physical Exam: There were no vitals filed for this visit. There is no height or weight on file to calculate BMI. GEN: NAD EYE: Sclerae anicteric ENT: MMM CV: Non-tachycardic GI: Soft, NT/ND NEURO:  Alert & Oriented x 3  Lab Results: No results for input(s): "WBC", "HGB", "HCT", "PLT" in the last 72 hours. BMET No results for input(s): "NA", "K", "CL", "CO2", "GLUCOSE", "BUN", "CREATININE", "CALCIUM" in the last 72 hours. LFT No results for input(s): "PROT", "ALBUMIN", "AST", "ALT", "ALKPHOS", "BILITOT", "BILIDIR", "IBILI" in the last 72 hours. PT/INR No results for input(s): "LABPROT", "INR" in the last 72 hours.   Impression / Plan: This is a 41 y.o.male who presents for Colonoscopy/EGD for evaluation of bloating, distention, change in bowel habits, diarrhea, pain.  The risks and benefits of endoscopic evaluation/treatment were discussed with the patient and/or family; these include but are not limited to the risk of perforation, infection, bleeding, missed lesions, lack of diagnosis, severe illness requiring hospitalization, as well as anesthesia and sedation related illnesses.  The patient's history has been reviewed, patient examined, no change in status, and deemed stable for procedure.  The patient and/or family is agreeable to proceed.    Justice Britain, MD [facility] Office # [phone]

## 2022-07-07 ENCOUNTER — Telehealth: Payer: Self-pay | Admitting: *Deleted

## 2022-07-07 NOTE — Telephone Encounter (Signed)
  Follow up Call-     07/04/2022   10:29 AM  Call back number  Post procedure Call Back phone  # [phone]  Permission to leave phone message Yes     Patient questions:  Do you have a fever, pain , or abdominal swelling? No. Pain Score  0 *  Have you tolerated food without any problems? Yes.    Have you been able to return to your normal activities? Yes.    Do you have any questions about your discharge instructions: Diet   No. Medications  No. Follow up visit  No.  Do you have questions or concerns about your Care? No.  Actions: * If pain score is 4 or above: No action needed, pain <4.

## 2022-07-09 ENCOUNTER — Other Ambulatory Visit: Payer: Self-pay | Admitting: Endocrinology

## 2022-07-09 DIAGNOSIS — E1065 Type 1 diabetes mellitus with hyperglycemia: Secondary | ICD-10-CM

## 2022-07-11 ENCOUNTER — Encounter: Payer: Self-pay | Admitting: Gastroenterology

## 2022-07-29 ENCOUNTER — Telehealth: Payer: Self-pay | Admitting: *Deleted

## 2022-07-29 NOTE — Progress Notes (Signed)
  Care Coordination   Note   07/29/2022 Name: BENJAMIM HARNISH MRN: [id number] DOB: 06-29-1981  Excell Seltzer Broughton is a 41 y.o. year old male who sees Seward Carol, MD for primary care. I reached out to Adam Phenix by phone today to offer care coordination services.  Mr. Pichon was given information about Care Coordination services today including:   The Care Coordination services include support from the care team which includes your Nurse Coordinator, Clinical Social Worker, or Pharmacist.  The Care Coordination team is here to help remove barriers to the health concerns and goals most important to you. Care Coordination services are voluntary, and the patient may decline or stop services at any time by request to their care team member.   Care Coordination Consent Status: Patient did not agree to participate in care coordination services at this time.   Encounter Outcome:  Pt. Refused  [place]  Direct Dial: [phone]

## 2022-07-31 ENCOUNTER — Other Ambulatory Visit: Payer: Self-pay | Admitting: Endocrinology

## 2022-07-31 ENCOUNTER — Other Ambulatory Visit: Payer: Self-pay | Admitting: Orthopaedic Surgery

## 2022-08-01 DIAGNOSIS — R350 Frequency of micturition: Secondary | ICD-10-CM | POA: Diagnosis not present

## 2022-08-04 DIAGNOSIS — R351 Nocturia: Secondary | ICD-10-CM | POA: Diagnosis not present

## 2022-08-04 DIAGNOSIS — Z125 Encounter for screening for malignant neoplasm of prostate: Secondary | ICD-10-CM | POA: Diagnosis not present

## 2022-08-04 DIAGNOSIS — R8271 Bacteriuria: Secondary | ICD-10-CM | POA: Diagnosis not present

## 2022-08-18 DIAGNOSIS — R351 Nocturia: Secondary | ICD-10-CM | POA: Diagnosis not present

## 2022-09-01 ENCOUNTER — Other Ambulatory Visit: Payer: BC Managed Care – PPO

## 2022-09-03 ENCOUNTER — Ambulatory Visit: Payer: BC Managed Care – PPO | Admitting: Endocrinology

## 2022-09-08 ENCOUNTER — Other Ambulatory Visit: Payer: Self-pay | Admitting: Endocrinology

## 2022-09-08 DIAGNOSIS — E1065 Type 1 diabetes mellitus with hyperglycemia: Secondary | ICD-10-CM

## 2022-09-10 DIAGNOSIS — E113291 Type 2 diabetes mellitus with mild nonproliferative diabetic retinopathy without macular edema, right eye: Secondary | ICD-10-CM | POA: Diagnosis not present

## 2022-09-18 ENCOUNTER — Emergency Department (HOSPITAL_COMMUNITY)
Admission: EM | Admit: 2022-09-18 | Discharge: 2022-09-18 | Disposition: A | Discharge status: Home / Self Care | Payer: BC Managed Care – PPO | Attending: Emergency Medicine | Admitting: Emergency Medicine

## 2022-09-18 DIAGNOSIS — Z794 Long term (current) use of insulin: Secondary | ICD-10-CM | POA: Diagnosis not present

## 2022-09-18 DIAGNOSIS — R000 Tachycardia, unspecified: Secondary | ICD-10-CM | POA: Diagnosis not present

## 2022-09-18 DIAGNOSIS — I10 Essential (primary) hypertension: Secondary | ICD-10-CM | POA: Diagnosis not present

## 2022-09-18 DIAGNOSIS — G4710 Hypersomnia, unspecified: Secondary | ICD-10-CM | POA: Diagnosis not present

## 2022-09-18 DIAGNOSIS — R531 Weakness: Secondary | ICD-10-CM | POA: Diagnosis not present

## 2022-09-18 DIAGNOSIS — Z79899 Other long term (current) drug therapy: Secondary | ICD-10-CM | POA: Diagnosis not present

## 2022-09-18 DIAGNOSIS — T40715A Adverse effect of cannabis, initial encounter: Secondary | ICD-10-CM | POA: Diagnosis not present

## 2022-09-18 DIAGNOSIS — E119 Type 2 diabetes mellitus without complications: Secondary | ICD-10-CM | POA: Insufficient documentation

## 2022-09-18 DIAGNOSIS — G4700 Insomnia, unspecified: Secondary | ICD-10-CM | POA: Diagnosis not present

## 2022-09-18 DIAGNOSIS — R9431 Abnormal electrocardiogram [ECG] [EKG]: Secondary | ICD-10-CM | POA: Diagnosis not present

## 2022-09-18 LAB — CBC WITH DIFFERENTIAL/PLATELET
Abs Immature Granulocytes: 0.05 10*3/uL (ref 0.00–0.07)
Basophils Absolute: 0 10*3/uL (ref 0.0–0.1)
Basophils Relative: 0 %
Eosinophils Absolute: 0 10*3/uL (ref 0.0–0.5)
Eosinophils Relative: 0 %
HCT: 42 % (ref 39.0–52.0)
Hemoglobin: 14.2 g/dL (ref 13.0–17.0)
Immature Granulocytes: 0 %
Lymphocytes Relative: 8 %
Lymphs Abs: 1.1 10*3/uL (ref 0.7–4.0)
MCH: 30.1 pg (ref 26.0–34.0)
MCHC: 33.8 g/dL (ref 30.0–36.0)
MCV: 89 fL (ref 80.0–100.0)
Monocytes Absolute: 0.7 10*3/uL (ref 0.1–1.0)
Monocytes Relative: 5 %
Neutro Abs: 10.9 10*3/uL — ABNORMAL HIGH (ref 1.7–7.7)
Neutrophils Relative %: 87 %
Platelets: 226 10*3/uL (ref 150–400)
RBC: 4.72 MIL/uL (ref 4.22–5.81)
RDW: 12.1 % (ref 11.5–15.5)
WBC: 12.7 10*3/uL — ABNORMAL HIGH (ref 4.0–10.5)
nRBC: 0 % (ref 0.0–0.2)

## 2022-09-18 LAB — COMPREHENSIVE METABOLIC PANEL
ALT: 47 U/L — ABNORMAL HIGH (ref 0–44)
AST: 30 U/L (ref 15–41)
Albumin: 3.9 g/dL (ref 3.5–5.0)
Alkaline Phosphatase: 64 U/L (ref 38–126)
Anion gap: 5 (ref 5–15)
BUN: 13 mg/dL (ref 6–20)
CO2: 27 mmol/L (ref 22–32)
Calcium: 9.1 mg/dL (ref 8.9–10.3)
Chloride: 105 mmol/L (ref 98–111)
Creatinine, Ser: 1.29 mg/dL — ABNORMAL HIGH (ref 0.61–1.24)
GFR, Estimated: >60 mL/min (ref >60)
Glucose, Bld: 155 mg/dL — ABNORMAL HIGH (ref 70–99)
Potassium: 4.6 mmol/L (ref 3.5–5.1)
Sodium: 137 mmol/L (ref 135–145)
Total Bilirubin: 0.6 mg/dL (ref 0.3–1.2)
Total Protein: 7.1 g/dL (ref 6.5–8.1)

## 2022-09-18 LAB — RAPID URINE DRUG SCREEN, HOSP PERFORMED
Amphetamines: NOT DETECTED
Barbiturates: NOT DETECTED
Benzodiazepines: NOT DETECTED
Cocaine: NOT DETECTED
Opiates: NOT DETECTED
Tetrahydrocannabinol: POSITIVE — AB

## 2022-09-18 LAB — ETHANOL: Alcohol, Ethyl (B): <10 mg/dL (ref <10)

## 2022-09-18 LAB — SALICYLATE LEVEL: Salicylate Lvl: <7 mg/dL — ABNORMAL LOW (ref 7.0–30.0)

## 2022-09-18 LAB — CBG MONITORING, ED: Glucose-Capillary: 150 mg/dL — ABNORMAL HIGH (ref 70–99)

## 2022-09-18 LAB — ACETAMINOPHEN LEVEL: Acetaminophen (Tylenol), Serum: <10 ug/mL — ABNORMAL LOW (ref 10–30)

## 2022-09-18 NOTE — Discharge Instructions (Signed)
As we discussed, your workup in the ER today was reassuring for acute findings.  I suspect your symptoms are due to adverse reactions to the CBD gummy you ingested earlier today.  I recommend avoiding this substance in the future.  Follow-up with your primary care doctor for any additional health needs.  Return if development of any new or worsening symptoms.

## 2022-09-18 NOTE — ED Triage Notes (Signed)
Patient BIB [facility] due to being high from a edible gummy. Patient will respond to questions slowly. Patients wife wanted him brought in to be checked. He has never taken anything like this before per patient and wife. Patient is Diabetic and has insulin pump. IV 20g right AC. JRPRN

## 2022-09-18 NOTE — ED Notes (Signed)
Pt walked back and forth in his room with no assistance. Pt also drinking water at the moment.

## 2022-09-18 NOTE — ED Provider Notes (Signed)
[facility] DEPT Provider Note   CSN: [id number] Arrival date & time: 09/18/22  1446     History  No chief complaint on file.   Andre Brown is a 41 y.o. male.  Patient with history of diabetes presents today after taking a CBG gummy. States that he took same at around 11 am this morning. States he got the medication from a vape shop. He has never taken CBG before. Patients wife called EMS given his significant sleepiness and slow response to questions and was concerned that the gummy was laced with something. Patient denies fevers, chills, chest pain, shortness of breath, nausea, vomiting, diarrhea, abdominal pain. He is alert and oriented.     The history is provided by the patient. No language interpreter was used.       Home Medications Prior to Admission medications   Medication Sig Start Date End Date Taking? Authorizing Provider  B-D ULTRAFINE III SHORT PEN 31G X 8 MM MISC USE 4 TIMES A DAY 08/29/20   Renato Shin, MD  Continuous Blood Gluc Sensor (DEXCOM G6 SENSOR) MISC 3 each by Other route See admin instructions. Apply one sensor to body once every 10 days; E11.9 07/25/21   Renato Shin, MD  Continuous Blood Gluc Transmit (DEXCOM G6 TRANSMITTER) MISC USE AS DIRECTED 07/25/21   Renato Shin, MD  diclofenac (VOLTAREN) 75 MG EC tablet TAKE 1 TABLET (75 MG TOTAL) BY MOUTH 2 (TWO) TIMES DAILY BETWEEN MEALS AS NEEDED. 07/31/22   Mcarthur Rossetti, MD  Dulaglutide (TRULICITY) 4.5 NM/2.6YY SOPN INJECT 4.5 MG AS DIRECTED ONCE A WEEK.    [provider]  furosemide (LASIX) 20 MG tablet Take 20 mg by mouth daily as needed. 01/10/21   [provider]  glucagon (GLUCAGON EMERGENCY) 1 MG injection Inject 1 mg into the muscle once as needed for up to 1 dose. 05/16/19   Renato Shin, MD  insulin degludec (TRESIBA FLEXTOUCH) 100 UNIT/ML FlexTouch Pen Inject 40 Units into the skin daily. 11/19/21   Renato Shin, MD  Insulin  Disposable Pump (OMNIPOD 5 G6 INTRO, GEN 5,) KIT Change pod every 3 days 02/12/22   Elayne Snare, MD  Insulin Disposable Pump (OMNIPOD 5 G6 POD, GEN 5,) MISC Change pod every 2 days 04/17/22   Elayne Snare, MD  insulin lispro (HUMALOG KWIKPEN) 200 UNIT/ML KwikPen Use up to 100 units daily in pump 04/17/22   Elayne Snare, MD  ketoconazole (NIZORAL) 2 % cream Apply 1 application topically daily. 08/31/19   Hall-Potvin, [place], PA-C  levothyroxine (SYNTHROID) 150 MCG tablet TAKE 1 TABLET BY MOUTH EVERY DAY 07/25/21   Renato Shin, MD  NOVOLOG 100 UNIT/ML injection INJECT UP TO 80 UNITS INTO PUMP. 09/08/22   Elayne Snare, MD  pioglitazone (ACTOS) 15 MG tablet TAKE 1 TABLET (15 MG TOTAL) BY MOUTH DAILY. 07/31/22   Elayne Snare, MD  rosuvastatin (CRESTOR) 10 MG tablet Take 10 mg by mouth at bedtime. 02/05/21   [provider]  sildenafil (VIAGRA) 100 MG tablet TAKE 0.5-1 TABLETS (50-100 MG TOTAL) DAILY AS NEEDED FOR ERECTILE DYSFUNCTION. (6 PER 25 DAYS) 05/24/22   Elayne Snare, MD  Syringe/Needle, Disp, 30G X 1/2" 1 ML MISC Use to inject insulin into to pump 03/07/22   Elayne Snare, MD  triamcinolone cream (KENALOG) 0.1 % Apply 1 application topically 2 (two) times daily. 08/31/19   Hall-Potvin, [place], PA-C      Allergies    Lisinopril, Loratadine, Losartan potassium, and Shellfish allergy  Review of Systems   Review of Systems  All other systems reviewed and are negative.   Physical Exam Updated Vital Signs BP (!) 148/73   Pulse 81   Temp 97.6 F (36.4 C) (Oral)   Resp 17   SpO2 99%  Physical Exam Vitals and nursing note reviewed.  Constitutional:      General: He is not in acute distress.    Appearance: Normal appearance. He is normal weight. He is not ill-appearing, toxic-appearing or diaphoretic.  HENT:     Head: Normocephalic and atraumatic.  Eyes:     Extraocular Movements: Extraocular movements intact.     Comments: Pupils dilated but equal and reactive  Cardiovascular:      Rate and Rhythm: Normal rate and regular rhythm.     Heart sounds: Normal heart sounds.  Pulmonary:     Effort: Pulmonary effort is normal. No respiratory distress.     Breath sounds: Normal breath sounds.  Abdominal:     General: Abdomen is flat.     Palpations: Abdomen is soft.     Tenderness: There is no abdominal tenderness.  Musculoskeletal:        General: Normal range of motion.     Cervical back: Normal range of motion.  Skin:    General: Skin is warm and dry.  Neurological:     General: No focal deficit present.     Mental Status: He is alert and oriented to person, place, and time.     GCS: GCS eye subscore is 4. GCS verbal subscore is 5. GCS motor subscore is 6.     Sensory: Sensation is intact.     Motor: Motor function is intact.     Coordination: Coordination is intact.     Gait: Gait is intact.     Comments: Alert and oriented to self, place, time and event.   Patient clearly sleepy but is responsive to questions.   Speech is fluent, clear without dysarthria or dysphasia.    Strength 5/5 in upper/lower extremities   Sensation intact in upper/lower extremities    CN I not tested  CN II grossly intact visual fields bilaterally. Did not visualize posterior eye.  CN III, IV, VI PERRLA and EOMs intact bilaterally  CN V Intact sensation to sharp and light touch to the face  CN VII facial movements symmetric  CN VIII not tested  CN IX, X no uvula deviation, symmetric rise of soft palate  CN XI 5/5 SCM and trapezius strength bilaterally  CN XII Midline tongue protrusion, symmetric L/R movements   Psychiatric:        Mood and Affect: Mood normal.        Behavior: Behavior normal.     ED Results / Procedures / Treatments   Labs (all labs ordered are listed, but only abnormal results are displayed) Labs Reviewed  COMPREHENSIVE METABOLIC PANEL - Abnormal; Notable for the following components:      Result Value   Glucose, Bld 155 (*)    Creatinine, Ser 1.29 (*)     ALT 47 (*)    All other components within normal limits  SALICYLATE LEVEL - Abnormal; Notable for the following components:   Salicylate Lvl <1.9 (*)    All other components within normal limits  ACETAMINOPHEN LEVEL - Abnormal; Notable for the following components:   Acetaminophen (Tylenol), Serum <10 (*)    All other components within normal limits  RAPID URINE DRUG SCREEN, HOSP PERFORMED - Abnormal; Notable for  the following components:   Tetrahydrocannabinol POSITIVE (*)    All other components within normal limits  CBC WITH DIFFERENTIAL/PLATELET - Abnormal; Notable for the following components:   WBC 12.7 (*)    Neutro Abs 10.9 (*)    All other components within normal limits  CBG MONITORING, ED - Abnormal; Notable for the following components:   Glucose-Capillary 150 (*)    All other components within normal limits  ETHANOL    EKG None  Radiology No results found.  Procedures Procedures    Medications Ordered in ED Medications - No data to display  ED Course/ Medical Decision Making/ A&P                           Medical Decision Making Amount and/or Complexity of Data Reviewed Labs: ordered.   This patient is a 41 y.o. male who presents to the ED for concern of sleepiness after taking CBG gummy, this involves an extensive number of treatment options, and is a complaint that carries with it a high risk of complications and morbidity. The emergent differential diagnosis prior to evaluation includes, but is not limited to,  overdose, adverse medication reaction, electrolyte abnormality, sepsis, hypoglycemia.   This is not an exhaustive differential.   Past Medical History / Co-morbidities / Social History: Hx diabetes with insulin pump  Physical Exam: Physical exam performed. The pertinent findings include: Patient alert and oriented and neurologically intact without focal deficits  Lab Tests: I ordered, and personally interpreted labs.  The pertinent  results include:  creatinine 1.29 up from 1.16 4 months ago. WBC 12.7. CBG 150, UDS + for THC.    Cardiac Monitoring:  The patient was maintained on a cardiac monitor.  My attending physician Dr.  Sherry Ruffing viewed and interpreted the cardiac monitored which showed an underlying rhythm of: no STEMI. I agree with this interpretation.  Disposition:  Patient presents today with excessive sleepiness and slow response after ingestion of CBD gummy.  He is afebrile, nontoxic-appearing, and in no acute distress reassuring vital signs.  Is also alert and oriented and neurologically intact without focal deficits.  He is having difficulty staying awake and is slow to respond to questions.  Will get blood work and monitor.  After several hours of monitoring, patient is now much more alert.  He is able to ambulate throughout the department and eat and drink without difficulty.  He feels stable to go home.  This is reasonable given his improvement.  Patient is understanding and amenable with plan, educated on red flag symptoms that would prompt immediate return.  Patient discharged in stable condition.   Final Clinical Impression(s) / ED Diagnoses Final diagnoses:  Adverse reaction to cannabis, initial encounter    Rx / DC Orders ED Discharge Orders     None     An After Visit Summary was printed and given to the patient.     Nestor Lewandowsky 09/18/22 2116    Tegeler, Gwenyth Allegra, MD 09/19/22 1450

## 2022-10-16 ENCOUNTER — Other Ambulatory Visit: Payer: Self-pay | Admitting: Orthopaedic Surgery

## 2022-10-20 ENCOUNTER — Other Ambulatory Visit: Payer: Self-pay

## 2022-10-20 DIAGNOSIS — E1065 Type 1 diabetes mellitus with hyperglycemia: Secondary | ICD-10-CM

## 2022-10-20 MED ORDER — DEXCOM G6 TRANSMITTER MISC: 3 refills | Status: DC

## 2022-10-20 MED ORDER — DEXCOM G6 SENSOR MISC: 3.0000 | 12 refills | Status: DC

## 2022-11-01 ENCOUNTER — Other Ambulatory Visit: Payer: Self-pay | Admitting: Endocrinology

## 2022-11-01 DIAGNOSIS — E1065 Type 1 diabetes mellitus with hyperglycemia: Secondary | ICD-10-CM

## 2022-11-06 ENCOUNTER — Other Ambulatory Visit: Payer: Self-pay | Admitting: Endocrinology

## 2022-11-06 DIAGNOSIS — E1065 Type 1 diabetes mellitus with hyperglycemia: Secondary | ICD-10-CM

## 2022-11-10 ENCOUNTER — Other Ambulatory Visit: Payer: Self-pay | Admitting: Endocrinology

## 2022-11-10 ENCOUNTER — Telehealth: Payer: Self-pay

## 2022-11-10 NOTE — Telephone Encounter (Signed)
Trulicity    Product Backordered/Unavailable:ON BACKORDER.

## 2022-11-11 MED ORDER — TIRZEPATIDE 2.5 MG/0.5ML ~~LOC~~ SOAJ: 2.5000 mg | SUBCUTANEOUS | 0 refills | Status: DC

## 2022-11-11 MED ORDER — LEVOTHYROXINE SODIUM 150 MCG PO TABS: 150.0000 ug | ORAL_TABLET | Freq: Every day | ORAL | 3 refills | Status: DC

## 2022-11-11 NOTE — Addendum Note (Signed)
Addended by: Jefferson Fuel on: 11/11/2022 07:55 AM   Modules accepted: Orders

## 2022-11-11 NOTE — Telephone Encounter (Signed)
Patient has not had medication for at least a month.

## 2022-11-11 NOTE — Addendum Note (Signed)
Addended by: Jefferson Fuel on: 11/11/2022 10:42 AM   Modules accepted: Orders

## 2022-11-21 ENCOUNTER — Other Ambulatory Visit (INDEPENDENT_AMBULATORY_CARE_PROVIDER_SITE_OTHER): Payer: BC Managed Care – PPO

## 2022-11-21 DIAGNOSIS — E1065 Type 1 diabetes mellitus with hyperglycemia: Secondary | ICD-10-CM

## 2022-11-21 LAB — COMPREHENSIVE METABOLIC PANEL
ALT: 22 U/L (ref 0–53)
AST: 25 U/L (ref 0–37)
Albumin: 4.1 g/dL (ref 3.5–5.2)
Alkaline Phosphatase: 67 U/L (ref 39–117)
BUN: 12 mg/dL (ref 6–23)
CO2: 30 mEq/L (ref 19–32)
Calcium: 9.3 mg/dL (ref 8.4–10.5)
Chloride: 103 mEq/L (ref 96–112)
Creatinine, Ser: 1.25 mg/dL (ref 0.40–1.50)
GFR: 70.55 mL/min (ref >60.00)
Glucose, Bld: 122 mg/dL — ABNORMAL HIGH (ref 70–99)
Potassium: 4.2 mEq/L (ref 3.5–5.1)
Sodium: 139 mEq/L (ref 135–145)
Total Bilirubin: 0.5 mg/dL (ref 0.2–1.2)
Total Protein: 7.1 g/dL (ref 6.0–8.3)

## 2022-11-21 LAB — HEMOGLOBIN A1C: Hgb A1c MFr Bld: 7.7 % — ABNORMAL HIGH (ref 4.6–6.5)

## 2022-11-26 ENCOUNTER — Encounter: Payer: Self-pay | Admitting: Endocrinology

## 2022-11-26 ENCOUNTER — Ambulatory Visit (INDEPENDENT_AMBULATORY_CARE_PROVIDER_SITE_OTHER): Payer: BC Managed Care – PPO | Admitting: Endocrinology

## 2022-11-26 VITALS — BP 126/80 | HR 80 | Ht 68.0 in | Wt 253.4 lb

## 2022-11-26 DIAGNOSIS — E1065 Type 1 diabetes mellitus with hyperglycemia: Secondary | ICD-10-CM

## 2022-11-26 DIAGNOSIS — R945 Abnormal results of liver function studies: Secondary | ICD-10-CM | POA: Diagnosis not present

## 2022-11-26 DIAGNOSIS — E782 Mixed hyperlipidemia: Secondary | ICD-10-CM | POA: Diagnosis not present

## 2022-11-26 DIAGNOSIS — E038 Other specified hypothyroidism: Secondary | ICD-10-CM | POA: Diagnosis not present

## 2022-11-26 LAB — URINALYSIS, ROUTINE W REFLEX MICROSCOPIC
Bilirubin Urine: NEGATIVE
Hgb urine dipstick: NEGATIVE
Ketones, ur: NEGATIVE
Leukocytes,Ua: NEGATIVE
Nitrite: NEGATIVE
RBC / HPF: NONE SEEN (ref 0–?)
Specific Gravity, Urine: 1.025 (ref 1.000–1.030)
Total Protein, Urine: NEGATIVE
Urine Glucose: NEGATIVE
Urobilinogen, UA: 0.2 (ref 0.0–1.0)
WBC, UA: NONE SEEN (ref 0–?)
pH: 6.5 (ref 5.0–8.0)

## 2022-11-26 LAB — MICROALBUMIN / CREATININE URINE RATIO
Creatinine,U: 171.3 mg/dL
Microalb Creat Ratio: 0.4 mg/g (ref 0.0–30.0)
Microalb, Ur: <0.7 mg/dL (ref 0.0–1.9)

## 2022-11-26 LAB — TSH: TSH: 6.85 u[IU]/mL — ABNORMAL HIGH (ref 0.35–5.50)

## 2022-11-26 MED ORDER — TIRZEPATIDE 5 MG/0.5ML ~~LOC~~ SOAJ: 5.0000 mg | SUBCUTANEOUS | 3 refills | Status: DC

## 2022-11-26 NOTE — Progress Notes (Signed)
Please call to let patient know that the urine test results are normal and no further action needed.  Thyroid level is low and if he is taking his levothyroxine consistently before breakfast daily needs to increase the dose to 175 mcg instead of 150

## 2022-11-26 NOTE — Progress Notes (Signed)
Patient ID: Andre Brown, male   DOB: 1981-09-20, 42 y.o.   MRN: [id number]    Chief complaint : Follow up of Type 1 Diabetes  History of Present Illness:          Date of diagnosis: 1999     Prior history:   He was reportedly started on insulin 2002 According to available records A1c is mostly between 7-7.5 He has been taking Lantus insulin previously and subsequently [place] Also in 2022 he was given Trulicity  PREVIOUS INSULIN regimen :50 units Tresiba in the morning daily NOVOLOG mealtime doses 12 units + carbohydrate ratio 1:5  Recent history:      INSULIN pump settings with HUMALOG U-200 insulin: BASAL rate 0.6 at midnight and 0.7 at 6 AM BOLUS settings: Carb ratio 1:4, SENSITIVITY = 1: 80, target 120 12 AM to 6 AM and then 110 Active insulin 5 hours Correction threshold 130, target 120 at midnight and 110 at 6 AM  Non-insulin hypoglycemic drugs: Mounjaro 2.5 mg weekly, Actos 15 mg daily  His A1c is 7.7 Recent GMI on his pump download not calculated  Current management and diabetes problems identified as follows  He has continued difficulties with postprandial hyperglycemia He appears to be more consistently after lunch than in the evening He says that he will put in only 35 g of carbs instead of 45 at lunch because of fear of hypoglycemia especially if he does not finish his meal  However he does have somewhat better control after his evening meal even though they are averaging about 180 from his Dexcom analysis He could not get Trulicity and is just trying Mounjaro, this was sent in about 2 weeks ago No nausea with 2.5 mg Recently has 70% within target range and discussed that this shows improvement compared to his last visit despite higher A1c Because of needing large amount of insulin he is now taking U-200 insulin Current daily dose based on his pump settings is about 50 units lately with only 39 % in bolus insulin Generally needing much less insulin than  when he was doing injections He is now starting to do some walking for exercise  DEXCOM data interpretation currently  General patterns show good control with frequent postprandial hyperglycemia early afternoon and sporadic hypoglycemia in the evenings after 5 PM and minimal hypoglycemia OVERNIGHT blood sugars are stable although starting of around 180+ at midnight and subsequently in the low 100s range until midday Premeal blood sugars are variably high before dinnertime Hyperglycemia after LUNCH is present most days except about 3 days with overall average just over 200 After dinner blood sugars are very inconsistent and may be normal or low normal but also frequently high also at various times Minimal hypoglycemia documented only once at 9 AM and once overnight  CGM use % of time   2-week average/GV 146  Time in range      70  %  % Time Above 180 23+6  % Time above 250   % Time Below 70 1     PRE-MEAL Fasting Lunch Dinner Bedtime Overall  Glucose range:       Averages:        POST-MEAL PC Breakfast PC Lunch PC Dinner  Glucose range:     Averages:   185   Previously:  CGM use % of time   2-week average/GV 169/33  Time in range 60  % Time Above 180 29  % Time above 250 10  % Time  Below 70 1     Meal times: Somewhat variable         Exercise: Only occasional biking or walking.          Most recent dietitian/nurse educator visit : 2020         Retinal exams, Most recent: 9/22  Wt Readings from Last 3 Encounters:  11/26/22 253 lb 6.4 oz (114.9 kg)  07/04/22 252 lb (114.3 kg)  05/20/22 252 lb (114.3 kg)    Diabetes labs:  Lab Results  Component Value Date   HGBA1C 7.7 (H) 11/21/2022   HGBA1C 7.5 (H) 05/12/2022   HGBA1C 7.5 (A) 02/12/2022   Lab Results  Component Value Date   MICROALBUR 1.3 02/12/2022   LDLCALC 96 05/12/2022   CREATININE 1.25 11/21/2022    Lab on 11/21/2022  Component Date Value Ref Range Status   Sodium 11/21/2022 139  135 - 145 mEq/L  Final   Potassium 11/21/2022 4.2  3.5 - 5.1 mEq/L Final   Chloride 11/21/2022 103  96 - 112 mEq/L Final   CO2 11/21/2022 30  19 - 32 mEq/L Final   Glucose, Bld 11/21/2022 122 (H)  70 - 99 mg/dL Final   BUN 11/21/2022 12  6 - 23 mg/dL Final   Creatinine, Ser 11/21/2022 1.25  0.40 - 1.50 mg/dL Final   Total Bilirubin 11/21/2022 0.5  0.2 - 1.2 mg/dL Final   Alkaline Phosphatase 11/21/2022 67  39 - 117 U/L Final   AST 11/21/2022 25  0 - 37 U/L Final   ALT 11/21/2022 22  0 - 53 U/L Final   Total Protein 11/21/2022 7.1  6.0 - 8.3 g/dL Final   Albumin 11/21/2022 4.1  3.5 - 5.2 g/dL Final   GFR 11/21/2022 70.55  >60.00 mL/min Final   Calculated using the CKD-EPI Creatinine Equation (2021)   Calcium 11/21/2022 9.3  8.4 - 10.5 mg/dL Final   Hgb A1c MFr Bld 11/21/2022 7.7 (H)  4.6 - 6.5 % Final   Glycemic Control Guidelines for People with Diabetes:Non Diabetic:  <6%Goal of Therapy: <7%Additional Action Suggested:  >8%     Allergies as of 11/26/2022       Reactions   Lisinopril Other (See Comments)   Loratadine Other (See Comments)   Losartan Potassium Other (See Comments)   Shellfish Allergy    Itchy throat        Medication List        Accurate as of November 26, 2022 11:36 AM. If you have any questions, ask your nurse or doctor.          B-D ULTRAFINE III SHORT PEN 31G X 8 MM Misc Generic drug: Insulin Pen Needle USE 4 TIMES A DAY   Dexcom G6 Sensor Misc 3 each by Other route See admin instructions. Apply one sensor to body once every 10 days; E11.9   Dexcom G6 Transmitter Misc USE AS DIRECTED   diclofenac 75 MG EC tablet Commonly known as: VOLTAREN TAKE 1 TABLET (75 MG TOTAL) BY MOUTH 2 (TWO) TIMES DAILY BETWEEN MEALS AS NEEDED.   furosemide 20 MG tablet Commonly known as: LASIX Take 20 mg by mouth daily as needed.   Glucagon Emergency 1 MG Kit Inject 1 mg into the muscle once as needed for up to 1 dose.   HumaLOG KwikPen 200 UNIT/ML KwikPen Generic drug: insulin  lispro Use up to 100 units daily in pump   ketoconazole 2 % cream Commonly known as: NIZORAL Apply 1 application topically daily.  levothyroxine 150 MCG tablet Commonly known as: SYNTHROID Take 1 tablet (150 mcg total) by mouth daily.   NovoLOG 100 UNIT/ML injection Generic drug: insulin aspart INJECT UP TO 80 UNITS INTO PUMP.( 37 DAYS SUPPLY)   Omnipod 5 G6 Intro (Gen 5) Kit Change pod every 3 days   Omnipod 5 G6 Pods (Gen 5) Misc Change pod every 2 days   pioglitazone 15 MG tablet Commonly known as: ACTOS TAKE 1 TABLET (15 MG TOTAL) BY MOUTH DAILY.   rosuvastatin 10 MG tablet Commonly known as: CRESTOR Take 10 mg by mouth at bedtime.   sildenafil 100 MG tablet Commonly known as: VIAGRA TAKE 0.5-1 TABLETS (50-100 MG TOTAL) DAILY AS NEEDED FOR ERECTILE DYSFUNCTION. (6 PER 25 DAYS)   Syringe/Needle (Disp) 30G X 1/2" 1 ML Misc Use to inject insulin into to pump   tirzepatide 2.5 MG/0.5ML Pen Commonly known as: MOUNJARO Inject 2.5 mg into the skin once a week.   Tyler Aas FlexTouch 100 UNIT/ML FlexTouch Pen Generic drug: insulin degludec Inject 40 Units into the skin daily.   triamcinolone cream 0.1 % Commonly known as: KENALOG Apply 1 application topically 2 (two) times daily.        Allergies:  Allergies  Allergen Reactions   Lisinopril Other (See Comments)   Loratadine Other (See Comments)   Losartan Potassium Other (See Comments)   Shellfish Allergy     Itchy throat    Past Medical History:  Diagnosis Date   Diabetes mellitus without complication ([place])    History of cataract    Hypothyroidism    SOBOE (shortness of breath on exertion)    Swelling of both lower extremities    Thyroid disease     Past Surgical History:  Procedure Laterality Date   CATARACT EXTRACTION, BILATERAL  2021   WISDOM TOOTH EXTRACTION  2015    Family History  Adopted: Yes  Problem Relation Age of Onset   Colon polyps Maternal Uncle    Diabetes Neg Hx     Esophageal cancer Neg Hx    Inflammatory bowel disease Neg Hx    Liver disease Neg Hx    Pancreatic cancer Neg Hx    Rectal cancer Neg Hx    Stomach cancer Neg Hx    Colon cancer Neg Hx     Social History:  reports that he has never smoked. He has never used smokeless tobacco. He reports current alcohol use. He reports that he does not use drugs.    Review of Systems:   Blood pressure: Normal without medications  BP Readings from Last 3 Encounters:  11/26/22 126/80  09/18/22 (!) 140/79  07/04/22 115/78     Lipids: Taking Crestor from his PCP  Lab Results  Component Value Date   CHOL 161 05/12/2022   HDL 47.90 05/12/2022   LDLCALC 96 05/12/2022   TRIG 87.0 05/12/2022   CHOLHDL 3 05/12/2022     Diabetes complications: ED  He has been told to have hypothyroidism and is now taking 150 mcg of levothyroxine  Lab Results  Component Value Date   TSH 3.18 08/27/2021   TSH 0.920 02/06/2021   TSH 4.30 07/25/2020   FREET4 1.44 02/06/2021   FREET4 0.99 07/25/2020   Unable to calculate Fibrosis 4 Score. Requires ALT, AST, and platelet count within the last 6 months.  However he is doing better with his liver function normalizing after starting Actos  Lab Results  Component Value Date   ALT 22 11/21/2022   ALT 47 (  H) 09/18/2022   ALT 38 05/20/2022   ALT 54 (H) 05/12/2022       Physical Examination:  BP 126/80 (BP Location: Left Arm, Patient Position: Sitting, Cuff Size: Normal)   Pulse 80   Ht '5\' 8"'$  (1.727 m)   Wt 253 lb 6.4 oz (114.9 kg)   SpO2 98%   BMI 38.53 kg/m         ASSESSMENT/PLAN   Diabetes type 1   Current treatment: OmniPod pump with NOVOLOG insulin  See history of present illness for detailed discussion of current diabetes management, blood sugar patterns and problems identified  A1c 7.7  However recent average blood sugar in the CGM was 159 indicating A1c closer to 7% Discussed day-to-day management difficulties, need for adequate  mealtime coverage which is contributing to his high A1c Also not clear if he may benefit from higher doses of Mounjaro compared to Trulicity, does need weight loss                      Recommendations:   He will continue same settings on the pump for now Discussed need to try and BOLUS before starting to eat but he can reduce the bolus at lunchtime if he is unsure how much she is going to eat and bolus the rest as soon as he finishes eating  Also bolus before starting to eat in the evening No change in target setting Regular exercise He can try 5 mg Mounjaro now Stay on Actos Continue using Humalog U-200 insulin  Follow-up in 3 months  Blood pressure well-controlled Microalbumin normal  LIVER function abnormality: This is better with starting Actos and will continue Also to be followed by gastroenterology Again needs weight loss, may also benefit from [facility]  Discussed that if he is having continued issues with tinea pedis he will need to discuss with PCP or dermatologist, in the meantime he can continue using Lamisil as before OTC  HYPOTHYROIDISM: Will check TSH today  Total visit time for evaluation and management of his diabetes and other related issues, review of labs, and counseling = 40 minutes   Elayne Snare 11/26/2022, 11:36 AM      Elayne Snare

## 2022-11-26 NOTE — Patient Instructions (Signed)
Bolus 2x at lunch to cover all carbs  Bolus before all meals

## 2022-12-04 ENCOUNTER — Other Ambulatory Visit: Payer: Self-pay

## 2022-12-04 DIAGNOSIS — E1065 Type 1 diabetes mellitus with hyperglycemia: Secondary | ICD-10-CM

## 2022-12-04 MED ORDER — LEVOTHYROXINE SODIUM 175 MCG PO TABS: 175.0000 ug | ORAL_TABLET | Freq: Every day | ORAL | 3 refills | Status: DC

## 2022-12-10 ENCOUNTER — Encounter: Payer: Self-pay | Admitting: Endocrinology

## 2022-12-16 ENCOUNTER — Other Ambulatory Visit: Payer: Self-pay | Admitting: Orthopaedic Surgery

## 2022-12-28 ENCOUNTER — Other Ambulatory Visit: Payer: Self-pay | Admitting: Endocrinology

## 2022-12-28 DIAGNOSIS — E1065 Type 1 diabetes mellitus with hyperglycemia: Secondary | ICD-10-CM

## 2023-02-21 ENCOUNTER — Other Ambulatory Visit: Payer: Self-pay | Admitting: Orthopaedic Surgery

## 2023-02-24 ENCOUNTER — Encounter: Payer: Self-pay | Admitting: "Endocrinology

## 2023-02-24 ENCOUNTER — Ambulatory Visit (INDEPENDENT_AMBULATORY_CARE_PROVIDER_SITE_OTHER): Payer: BC Managed Care – PPO | Admitting: "Endocrinology

## 2023-02-24 VITALS — BP 120/70 | HR 85 | Ht 68.0 in | Wt 234.0 lb

## 2023-02-24 DIAGNOSIS — E7800 Pure hypercholesterolemia, unspecified: Secondary | ICD-10-CM

## 2023-02-24 DIAGNOSIS — E10649 Type 1 diabetes mellitus with hypoglycemia without coma: Secondary | ICD-10-CM

## 2023-02-24 DIAGNOSIS — Z6835 Body mass index (BMI) 35.0-35.9, adult: Secondary | ICD-10-CM | POA: Diagnosis not present

## 2023-02-24 DIAGNOSIS — E1065 Type 1 diabetes mellitus with hyperglycemia: Secondary | ICD-10-CM

## 2023-02-24 LAB — POCT GLYCOSYLATED HEMOGLOBIN (HGB A1C): Hemoglobin A1C: 6.5 % — AB (ref 4.0–5.6)

## 2023-02-24 MED ORDER — TIRZEPATIDE 7.5 MG/0.5ML ~~LOC~~ SOAJ
7.5000 mg | SUBCUTANEOUS | 0 refills | Status: DC
Start: 2023-02-24 — End: 2023-03-30

## 2023-02-24 MED ORDER — INSULIN ASPART 100 UNIT/ML IJ SOLN
INTRAMUSCULAR | 4 refills | Status: DC
Start: 2023-02-24 — End: 2023-04-03

## 2023-02-24 NOTE — Progress Notes (Signed)
Outpatient Endocrinology Note Andre , Andre Brown  02/24/23   Andre Brown August 31, 1981 [id number]  Referring Provider: Renford Dills, Andre Brown Primary Care Provider: Renford Dills, Andre Brown Reason for consultation: Subjective   Assessment & Plan  Diagnoses and all orders for this visit:  Uncontrolled type 1 diabetes mellitus with hypoglycemia without coma (HCC) -     POCT glycosylated hemoglobin (Hb A1C) -     tirzepatide (MOUNJARO) 7.5 MG/0.5ML Pen; Inject 7.5 mg into the skin once a week. -     insulin aspart (NOVOLOG) 100 UNIT/ML injection; Up to 50 units per day via pump  Pure hypercholesterolemia  Class 2 severe obesity due to excess calories with serious comorbidity and body mass index (BMI) of 35.0 to 35.9 in adult [facility])    Diabetes complicated by hypoglycemia Hba1c goal less than 7.0, current Hba1c is 6.5. Will recommend for the following change of medications to: Continue INSULIN pump settings with HUMALOG U-200 insulin: BASAL rate 0.6 at midnight and 0.7 at 6 AM BOLUS settings: Carb ratio 1:4 until 5 pm, then 1:3, Sensitivity = 1: 80, target 120 12 AM to 6 AM and then 110 Active insulin 5 hours  Watch for low. Pt undershoots carbs to prevent hypoglycemia Increase Mounjaro 7.5 mg weekly - for weight loss. No S/E Pt denies that he is not on Actos 15 mg daily  No known contraindications to any of above medications Glucagon - pt has it  Hyperlipidemia -Last LDL near goal: 96 -on rosuvastatin 10 mg QD -Follow low fat diet and exercise    -Blood pressure goal <140/90 - Microalbumin/creatinine at goal < 30 -not on ACE/ARB  -diet changes including salt restriction -limit eating outside -counseled BP targets per standards of diabetes care -Uncontrolled blood pressure can lead to retinopathy, nephropathy and cardiovascular and atherosclerotic heart disease  Reviewed and counseled on: -A1C target -Blood sugar targets -Complications of uncontrolled diabetes   -Checking blood sugar before meals and bedtime and bring log next visit -All medications with mechanism of action and side effects -Hypoglycemia management: rule of 15's, Glucagon Emergency Kit and medical alert ID -low-carb low-fat plate-method diet -At least 20 minutes of physical activity per day -Annual dilated retinal eye exam and foot exam -compliance and follow up needs -follow up as scheduled or earlier if problem gets worse  Call if blood sugar is less than 70 or consistently above 250    Take a 15 gm snack of carbohydrate at bedtime before you go to sleep if your blood sugar is less than 100.    If you are going to fast after midnight for a test or procedure, ask your physician for instructions on how to reduce/decrease your insulin dose.    Call if blood sugar is less than 70 or consistently above 250  -Treating a low sugar by rule of 15  (15 gms of sugar every 15 min until sugar is more than 70) If you feel your sugar is low, test your sugar to be sure If your sugar is low (less than 70), then take 15 grams of a fast acting Carbohydrate (3-4 glucose tablets or glucose gel or 4 ounces of juice or regular soda) Recheck your sugar 15 min after treating low to make sure it is more than 70 If sugar is still less than 70, treat again with 15 grams of carbohydrate          Don't drive the hour of hypoglycemia  If unconscious/unable to eat or drink  by mouth, use glucagon injection or nasal spray baqsimi and call 911. Can repeat again in 15 min if still unconscious.  Return in about 3 months (around 05/27/2023).   I have reviewed current medications, nurse's notes, allergies, vital signs, past medical and surgical history, family medical history, and social history for this encounter. Counseled patient on symptoms, examination findings, lab findings, imaging results, treatment decisions and monitoring and prognosis. The patient understood the recommendations and agrees with the  treatment plan. All questions regarding treatment plan were fully answered.  Andre Carlton, Andre Brown  02/24/23    History of Present Illness Andre Brown is a 42 y.o. year old male who presents for evaluation of Type 1 diabetes mellitus.  HYKEEM FANE was first diagnosed in 1999 at 21.   Diabetes education +  Home diabetes regimen:   Recent history:     INSULIN pump settings with HUMALOG U-200 insulin: BASAL rate 0.6 at midnight and 0.7 at 6 AM BOLUS settings: Carb ratio 1:4 until 5 pm, then 1:3, SENSITIVITY = 1: 80, target 120 12 AM to 6 AM and then 110 Active insulin 5 hours Correction threshold 130, target 120 at midnight and 110 at 6 AM  Non-insulin hypoglycemic drugs: Mounjaro 5 mg weekly  Prior history:   He was reportedly started on insulin 2002 He has been taking Lantus insulin previously and subsequently [place] Also in 2022 he was given Trulicity  PREVIOUS INSULIN regimen :50 units Tresiba in the morning daily NOVOLOG mealtime doses 12 units + carbohydrate ratio 1:5  COMPLICATIONS -  MI/Stroke -  retinopathy, last eye exam 2023 -  neuropathy -  nephropathy  BLOOD SUGAR DATA  CGM interpretation: At today's visit, we reviewed her CGM downloads. The full report is scanned in the media. Reviewing the CGM trends, BG well controlled except some highs around 3-6pm,   Physical Exam  BP 120/70   Pulse 85   Ht 5\' 8"  (1.727 m)   Wt 234 lb (106.1 kg)   SpO2 97%   BMI 35.58 kg/m    Constitutional: well developed, well nourished Head: normocephalic, atraumatic Eyes: sclera anicteric, no redness Neck: supple Lungs: normal respiratory effort Neurology: alert and oriented Skin: dry, no appreciable rashes Musculoskeletal: no appreciable defects Psychiatric: normal mood and affect Diabetic Foot Exam - Simple   Simple Foot Form Diabetic Foot exam was performed with the following findings: Yes 02/24/2023 10:08 AM  Visual Inspection No deformities, no  ulcerations, no other skin breakdown bilaterally: Yes Sensation Testing Intact to touch and monofilament testing bilaterally: Yes Pulse Check Posterior Tibialis and Dorsalis pulse intact bilaterally: Yes Comments      Current Medications Patient's Medications  New Prescriptions   TIRZEPATIDE (MOUNJARO) 7.5 MG/0.5ML PEN    Inject 7.5 mg into the skin once a week.  Previous Medications   B-D ULTRAFINE III SHORT PEN 31G X 8 MM MISC    USE 4 TIMES A DAY   CONTINUOUS BLOOD GLUC SENSOR (DEXCOM G6 SENSOR) MISC    3 each by Other route See admin instructions. Apply one sensor to body once every 10 days; E11.9   CONTINUOUS BLOOD GLUC TRANSMIT (DEXCOM G6 TRANSMITTER) MISC    USE AS DIRECTED   DICLOFENAC (VOLTAREN) 75 MG EC TABLET    TAKE 1 TABLET (75 MG TOTAL) BY MOUTH 2 (TWO) TIMES DAILY BETWEEN MEALS AS NEEDED.   FUROSEMIDE (LASIX) 20 MG TABLET    Take 20 mg by mouth daily as needed.   GLUCAGON (GLUCAGON  EMERGENCY) 1 MG INJECTION    Inject 1 mg into the muscle once as needed for up to 1 dose.   INSULIN DISPOSABLE PUMP (OMNIPOD 5 G6 INTRO, GEN 5,) KIT    Change pod every 3 days   INSULIN DISPOSABLE PUMP (OMNIPOD 5 G6 POD, GEN 5,) MISC    Change pod every 2 days   KETOCONAZOLE (NIZORAL) 2 % CREAM    Apply 1 application topically daily.   LEVOTHYROXINE (SYNTHROID) 175 MCG TABLET    Take 1 tablet (175 mcg total) by mouth daily.   ROSUVASTATIN (CRESTOR) 10 MG TABLET    Take 10 mg by mouth at bedtime.   SILDENAFIL (VIAGRA) 100 MG TABLET    TAKE 0.5-1 TABLETS (50-100 MG TOTAL) DAILY AS NEEDED FOR ERECTILE DYSFUNCTION. (6 PER 25 DAYS)   SYRINGE/NEEDLE, DISP, 30G X 1/2" 1 ML MISC    Use to inject insulin into to pump   TRIAMCINOLONE CREAM (KENALOG) 0.1 %    Apply 1 application topically 2 (two) times daily.  Modified Medications   Modified Medication Previous Medication   INSULIN ASPART (NOVOLOG) 100 UNIT/ML INJECTION insulin aspart (NOVOLOG) 100 UNIT/ML injection      Up to 50 units per day via pump     INJECT UP TO 80 UNITS INTO PUMP.( 37 DAYS SUPPLY)  Discontinued Medications   INSULIN DEGLUDEC (TRESIBA FLEXTOUCH) 100 UNIT/ML FLEXTOUCH PEN    Inject 40 Units into the skin daily.   INSULIN LISPRO (HUMALOG KWIKPEN) 200 UNIT/ML KWIKPEN    Use up to 100 units daily in pump   PIOGLITAZONE (ACTOS) 15 MG TABLET    TAKE 1 TABLET (15 MG TOTAL) BY MOUTH DAILY.   TIRZEPATIDE (MOUNJARO) 5 MG/0.5ML PEN    Inject 5 mg into the skin once a week.    Allergies Allergies  Allergen Reactions   Lisinopril Other (See Comments)   Loratadine Other (See Comments)   Losartan Potassium Other (See Comments)   Shellfish Allergy     Itchy throat    Past Medical History Past Medical History:  Diagnosis Date   Diabetes mellitus without complication (HCC)    History of cataract    Hypothyroidism    SOBOE (shortness of breath on exertion)    Swelling of both lower extremities    Thyroid disease     Past Surgical History Past Surgical History:  Procedure Laterality Date   CATARACT EXTRACTION, BILATERAL  2021   WISDOM TOOTH EXTRACTION  2015    Family History family history includes Colon polyps in his maternal uncle. He was adopted.  Social History Social History   Socioeconomic History   Marital status: Married    Spouse name: Churchill Gere   Number of children: 2   Years of education: Not on file   Highest education level: Not on file  Occupational History   Occupation: Production manager  Tobacco Use   Smoking status: Never   Smokeless tobacco: Never  Substance and Sexual Activity   Alcohol use: Yes   Drug use: No   Sexual activity: Not on file  Other Topics Concern   Not on file  Social History Narrative   Not on file   Social Determinants of Health   Financial Resource Strain: Not on file  Food Insecurity: Not on file  Transportation Needs: Not on file  Physical Activity: Not on file  Stress: Not on file  Social Connections: Not on file  Intimate Partner Violence: Not on  file    Lab Results  Component Value Date   HGBA1C 6.5 (A) 02/24/2023   HGBA1C 7.7 (H) 11/21/2022   HGBA1C 7.5 (H) 05/12/2022   Lab Results  Component Value Date   CHOL 161 05/12/2022   Lab Results  Component Value Date   HDL 47.90 05/12/2022   Lab Results  Component Value Date   LDLCALC 96 05/12/2022   Lab Results  Component Value Date   TRIG 87.0 05/12/2022   Lab Results  Component Value Date   CHOLHDL 3 05/12/2022   Lab Results  Component Value Date   CREATININE 1.25 11/21/2022   Lab Results  Component Value Date   GFR 70.55 11/21/2022   Lab Results  Component Value Date   MICROALBUR <0.7 11/26/2022      Component Value Date/Time   NA 139 11/21/2022 0908   K 4.2 11/21/2022 0908   CL 103 11/21/2022 0908   CO2 30 11/21/2022 0908   GLUCOSE 122 (H) 11/21/2022 0908   BUN 12 11/21/2022 0908   CREATININE 1.25 11/21/2022 0908   CREATININE 1.00 08/05/2014 1434   CALCIUM 9.3 11/21/2022 0908   PROT 7.1 11/21/2022 0908   ALBUMIN 4.1 11/21/2022 0908   AST 25 11/21/2022 0908   ALT 22 11/21/2022 0908   ALKPHOS 67 11/21/2022 0908   BILITOT 0.5 11/21/2022 0908   GFRNONAA >60 09/18/2022 1615   GFRAA >60 09/22/2018 1505      Latest Ref Rng & Units 11/21/2022    9:08 AM 09/18/2022    4:15 PM 05/20/2022   10:23 AM  BMP  Glucose 70 - 99 mg/dL 563  875  643   BUN 6 - 23 mg/dL 12  13  12    Creatinine 0.40 - 1.50 mg/dL 3.29  5.18  8.41   Sodium 135 - 145 mEq/L 139  137  138   Potassium 3.5 - 5.1 mEq/L 4.2  4.6  4.4   Chloride 96 - 112 mEq/L 103  105  101   CO2 19 - 32 mEq/L 30  27  26    Calcium 8.4 - 10.5 mg/dL 9.3  9.1  9.3        Component Value Date/Time   WBC 12.7 (H) 09/18/2022 1615   RBC 4.72 09/18/2022 1615   HGB 14.2 09/18/2022 1615   HCT 42.0 09/18/2022 1615   PLT 226 09/18/2022 1615   MCV 89.0 09/18/2022 1615   MCV 86.5 08/05/2014 1437   MCH 30.1 09/18/2022 1615   MCHC 33.8 09/18/2022 1615   RDW 12.1 09/18/2022 1615   LYMPHSABS 1.1 09/18/2022  1615   MONOABS 0.7 09/18/2022 1615   EOSABS 0.0 09/18/2022 1615   BASOSABS 0.0 09/18/2022 1615     Parts of this note may have been dictated using voice recognition software. There may be variances in spelling and vocabulary which are unintentional. Not all errors are proofread. Please notify the Thereasa Parkin if any discrepancies are noted or if the meaning of any statement is not clear.

## 2023-02-26 ENCOUNTER — Encounter: Payer: Self-pay | Admitting: "Endocrinology

## 2023-03-28 ENCOUNTER — Other Ambulatory Visit: Payer: Self-pay | Admitting: Endocrinology

## 2023-03-28 DIAGNOSIS — E1065 Type 1 diabetes mellitus with hyperglycemia: Secondary | ICD-10-CM

## 2023-03-30 ENCOUNTER — Other Ambulatory Visit: Payer: Self-pay | Admitting: "Endocrinology

## 2023-03-30 DIAGNOSIS — E10649 Type 1 diabetes mellitus with hypoglycemia without coma: Secondary | ICD-10-CM

## 2023-03-30 MED ORDER — TIRZEPATIDE 10 MG/0.5ML ~~LOC~~ SOAJ: 10.0000 mg | SUBCUTANEOUS | 1 refills | Status: DC

## 2023-03-30 NOTE — Telephone Encounter (Signed)
Yes he is willing to go to 10mg  of mounjaro and he is also asking for Humalog U 200 kwickpen via pump I did not see this on his med list

## 2023-04-01 ENCOUNTER — Encounter: Payer: Self-pay | Admitting: "Endocrinology

## 2023-04-01 DIAGNOSIS — E10649 Type 1 diabetes mellitus with hypoglycemia without coma: Secondary | ICD-10-CM

## 2023-04-01 DIAGNOSIS — E1065 Type 1 diabetes mellitus with hyperglycemia: Secondary | ICD-10-CM

## 2023-04-02 ENCOUNTER — Telehealth: Payer: Self-pay

## 2023-04-02 ENCOUNTER — Other Ambulatory Visit: Payer: Self-pay | Admitting: "Endocrinology

## 2023-04-02 DIAGNOSIS — E10649 Type 1 diabetes mellitus with hypoglycemia without coma: Secondary | ICD-10-CM

## 2023-04-02 MED ORDER — HUMALOG KWIKPEN 200 UNIT/ML ~~LOC~~ SOPN
3.0000 [IU] | PEN_INJECTOR | Freq: Once | SUBCUTANEOUS | 0 refills | Status: DC
Start: 2023-04-02 — End: 2023-04-02

## 2023-04-02 NOTE — Telephone Encounter (Signed)
Sham Alviar Ann Parker Sawatzky, CMA  ?

## 2023-04-03 ENCOUNTER — Telehealth: Payer: Self-pay

## 2023-04-03 ENCOUNTER — Other Ambulatory Visit: Payer: Self-pay

## 2023-04-03 ENCOUNTER — Other Ambulatory Visit (HOSPITAL_COMMUNITY): Payer: Self-pay

## 2023-04-03 ENCOUNTER — Other Ambulatory Visit: Payer: Self-pay | Admitting: "Endocrinology

## 2023-04-03 DIAGNOSIS — E10649 Type 1 diabetes mellitus with hypoglycemia without coma: Secondary | ICD-10-CM

## 2023-04-03 MED ORDER — HUMALOG KWIKPEN 200 UNIT/ML ~~LOC~~ SOPN
PEN_INJECTOR | SUBCUTANEOUS | 0 refills | Status: DC
Start: 2023-04-03 — End: 2023-04-09

## 2023-04-03 MED ORDER — TRESIBA FLEXTOUCH 100 UNIT/ML ~~LOC~~ SOPN: 50.0000 [IU] | PEN_INJECTOR | Freq: Every day | SUBCUTANEOUS | 0 refills | Status: DC

## 2023-04-03 NOTE — Telephone Encounter (Signed)
Hey, can you please document that you talked with this patient?

## 2023-04-03 NOTE — Telephone Encounter (Addendum)
I spoke to Mr Andre Brown and let him know that the Mounjaro 10 mg is on order at [facility] and also the Humalog U 200 does need a prior auth in which I sent to the PA Team for A ASAP auth if possible, I also sent Tresiba 100 unit Pen to his pharmacy that he uses as a back up . I went through his med list again with him and we both agreed that his medications that are listed are currently correct in EPIC

## 2023-04-03 NOTE — Telephone Encounter (Addendum)
Andre Brown is calling asking for his Humalog U200 kwikpen  to be sent in for his pump he is stating that he needs it asap he leaves tomorrow to go on vacation , he states to look back to Dr Emelda Brothers recommendation for pump up to 100 units daily inserted into pump 90 day supply also stating the increased dose mounjaro 10 [facility]

## 2023-04-03 NOTE — Telephone Encounter (Signed)
nsulin lispro (HUMALOG KWIKPEN) 200 UNIT/ML KwikPen needs a prior auth due to dose change , asap

## 2023-04-03 NOTE — Telephone Encounter (Signed)
Pharmacy Patient Advocate Encounter  Ran test claim for insulin lispro (HUMALOG KWIKPEN) 200 UNIT/ML KwikPen and it is too soon to refill.   Next refill on 04/07/23.  There is already a PA on file expiring 04/10/23  Ran another test claim for an alternative and  Novolog flexpen is covered by insurance with a copay of $0.   Please advise if change would be appropriate

## 2023-04-08 ENCOUNTER — Telehealth: Payer: Self-pay

## 2023-04-08 NOTE — Telephone Encounter (Signed)
Kaydan Wong is calling stating that he needs a new Prior Auth for insulin lispro (HUMALOG KWIKPEN) 200 UNIT/ML KwikPen

## 2023-04-09 ENCOUNTER — Telehealth: Payer: Self-pay | Admitting: Dietician

## 2023-04-09 ENCOUNTER — Other Ambulatory Visit: Payer: Self-pay

## 2023-04-09 ENCOUNTER — Telehealth: Payer: Self-pay

## 2023-04-09 ENCOUNTER — Telehealth: Payer: Self-pay | Admitting: "Endocrinology

## 2023-04-09 DIAGNOSIS — E10649 Type 1 diabetes mellitus with hypoglycemia without coma: Secondary | ICD-10-CM

## 2023-04-09 MED ORDER — HUMALOG KWIKPEN 200 UNIT/ML ~~LOC~~ SOPN
PEN_INJECTOR | SUBCUTANEOUS | 3 refills | Status: DC
Start: 2023-04-09 — End: 2023-04-13

## 2023-04-09 MED ORDER — HUMALOG KWIKPEN 200 UNIT/ML ~~LOC~~ SOPN
PEN_INJECTOR | SUBCUTANEOUS | 0 refills | Status: DC
Start: 2023-04-09 — End: 2023-04-09

## 2023-04-09 NOTE — Telephone Encounter (Signed)
I have LVM for Andre Brown to return my call to verify

## 2023-04-09 NOTE — Telephone Encounter (Signed)
Humalog goies into his pump  and Novolog does not

## 2023-04-09 NOTE — Telephone Encounter (Signed)
He uses Humalog in his pump

## 2023-04-09 NOTE — Telephone Encounter (Signed)
Patient states he has an alternate number if the 1st doesn't work --[phone] Wife's number.( Tiffany)

## 2023-04-09 NOTE — Telephone Encounter (Signed)
Error

## 2023-04-09 NOTE — Telephone Encounter (Signed)
Patient asking to speak to Andre Brown he is stating he wants to have a meeting.Please advise

## 2023-04-09 NOTE — Telephone Encounter (Signed)
Pharmacy Patient Advocate Encounter   Received notification from Pt Calls Messages that prior authorization for Humalog is required/requested.   Per test claim:  Novolog is preferred by the ins.  If suggested medication is appropriate, Please send in a new RX and discontinue this one. If not, please advise as to why it's not appropriate so that we may request a Prior Authorization.

## 2023-04-09 NOTE — Telephone Encounter (Signed)
Called patient due to concerns about his insulin prescription.  Patient states that he called multiple days in a row to obtain an insulin prescription.  He received a prescription for Humalog U-200 but it was insufficient to cover his insulin needs. He was then told that Humalog U-200 was not covered by insurance and needed a prior authorization.  Novolog (U-100) was ordered.  Patient has high insulin needs and frequently uses 100 units of Humalog U-200 insulin daily via an insulin pump to control his blood glucose.  A U-100 insulin would require daily or more frequent POD changes which would not work with his current therapy.  Patient states that he is out of insulin since Tuesday except for a back up insulin that he has.  He has called his insurance company and obtained prior authorization for his Humalog U-200 insulin.  Discussed with the medical assistant who ordered the Humalog U-200 insulin and he will be able to resume insulin pump therapy.  Discussed with clinic manager.  Patient to call for further questions.  Andre Brown, RD, LDN, CDCES

## 2023-04-09 NOTE — Telephone Encounter (Signed)
I have called Humalog U 200 90 day supply into [facility] , I spoke to the pharmacist to verify this medication and quantity was sent in and it was verified correctly at 4:39 pm on 04/09/23

## 2023-04-09 NOTE — Telephone Encounter (Signed)
Mr Turnage uses the Humalog as an emergency back up insulin if he goes to high

## 2023-04-10 ENCOUNTER — Other Ambulatory Visit (HOSPITAL_COMMUNITY): Payer: Self-pay

## 2023-04-10 ENCOUNTER — Telehealth: Payer: Self-pay | Admitting: Dietician

## 2023-04-10 NOTE — Telephone Encounter (Signed)
Called patient to see if he was able to pick up his insulin. He stated no and that [facility] stated that local pharmacies did not have the Humalog U-200. Called the [place] rep who was not aware of a shortage. Called his pharmacy.  They stated that they did not have enough to dispense a 90 day supply and would not dispense a partial prescription. They have ordered this for the patient. They stated that [facility] and [facility] have enough to dispense.   Called patient and discussed this. He is currently using the [place] and Novolog but with some problems with low blood glucose.  Current blood glucose was 61.  He has not eaten today.  Instructed patient to treat his low with 1/2 cup juice or regular soda and then eat lunch.  Oran Rein, RD, LDN, CDCES

## 2023-04-10 NOTE — Telephone Encounter (Signed)
Prior Authorization for Humalog was approved   Effective 04/09/23 - 04/08/24  Case#: [id number]

## 2023-04-13 ENCOUNTER — Other Ambulatory Visit: Payer: Self-pay

## 2023-04-13 DIAGNOSIS — E10649 Type 1 diabetes mellitus with hypoglycemia without coma: Secondary | ICD-10-CM

## 2023-04-13 MED ORDER — TRESIBA FLEXTOUCH 100 UNIT/ML ~~LOC~~ SOPN: 50.0000 [IU] | PEN_INJECTOR | Freq: Every day | SUBCUTANEOUS | 0 refills | Status: DC

## 2023-04-13 MED ORDER — HUMALOG KWIKPEN 200 UNIT/ML ~~LOC~~ SOPN
PEN_INJECTOR | SUBCUTANEOUS | 3 refills | Status: DC
Start: 2023-04-13 — End: 2023-04-29

## 2023-04-21 DIAGNOSIS — R21 Rash and other nonspecific skin eruption: Secondary | ICD-10-CM | POA: Diagnosis not present

## 2023-04-21 DIAGNOSIS — R42 Dizziness and giddiness: Secondary | ICD-10-CM | POA: Diagnosis not present

## 2023-04-23 ENCOUNTER — Other Ambulatory Visit: Payer: Self-pay | Admitting: Orthopaedic Surgery

## 2023-04-24 ENCOUNTER — Encounter: Payer: Self-pay | Admitting: "Endocrinology

## 2023-04-29 ENCOUNTER — Other Ambulatory Visit: Payer: Self-pay | Admitting: "Endocrinology

## 2023-04-29 DIAGNOSIS — E10649 Type 1 diabetes mellitus with hypoglycemia without coma: Secondary | ICD-10-CM

## 2023-04-29 NOTE — Telephone Encounter (Signed)
Patient is scheduled for 04/30/2023 at 8:00 AM for a virtual visit.

## 2023-04-30 ENCOUNTER — Telehealth (INDEPENDENT_AMBULATORY_CARE_PROVIDER_SITE_OTHER): Payer: BC Managed Care – PPO | Admitting: "Endocrinology

## 2023-04-30 ENCOUNTER — Encounter: Payer: Self-pay | Admitting: "Endocrinology

## 2023-04-30 DIAGNOSIS — E109 Type 1 diabetes mellitus without complications: Secondary | ICD-10-CM

## 2023-04-30 DIAGNOSIS — E7800 Pure hypercholesterolemia, unspecified: Secondary | ICD-10-CM | POA: Diagnosis not present

## 2023-04-30 DIAGNOSIS — E039 Hypothyroidism, unspecified: Secondary | ICD-10-CM

## 2023-04-30 DIAGNOSIS — Z7985 Long-term (current) use of injectable non-insulin antidiabetic drugs: Secondary | ICD-10-CM

## 2023-04-30 DIAGNOSIS — Z9641 Presence of insulin pump (external) (internal): Secondary | ICD-10-CM | POA: Diagnosis not present

## 2023-04-30 MED ORDER — LEVOTHYROXINE SODIUM 150 MCG PO TABS: 150.0000 ug | ORAL_TABLET | Freq: Every day | ORAL | 1 refills | Status: DC

## 2023-04-30 NOTE — Progress Notes (Addendum)
The patient reports they are currently: Andre Brown. I spent 12 minutes on the video with the patient on the date of service. I spent an additional 10 minutes on pre- and post-visit activities on the date of service.   The patient was physically located in [place] or a state in which I am permitted to provide care. The patient and/or parent/guardian understood that s/he may incur co-pays and cost sharing, and agreed to the telemedicine visit. The visit was reasonable and appropriate under the circumstances given the patient's presentation at the time.  The patient and/or parent/guardian has been advised of the potential risks and limitations of this mode of treatment (including, but not limited to, the absence of in-person examination) and has agreed to be treated using telemedicine. The patient's/patient's family's questions regarding telemedicine have been answered.   The patient and/or parent/guardian has also been advised to contact their provider's office for worsening conditions, and seek emergency medical treatment and/or call 911 if the patient deems either necessary.    Outpatient Endocrinology Note Altamese Cross Hill, MD  04/30/23   HEATH PENDERS 02/16/1981 [id number]  Referring Provider: Renford Dills, MD Primary Care Provider: Renford Dills, MD Reason for consultation: Subjective   Assessment & Plan  Diagnoses and all orders for this visit:  Controlled diabetes mellitus type 1 without complications (HCC)  Pure hypercholesterolemia  Insulin pump in place  Acquired hypothyroidism  Other orders -     levothyroxine (SYNTHROID) 150 MCG tablet; Take 1 tablet (150 mcg total) by mouth daily.    Diabetes complicated by hypoglycemia Hba1c goal less than 7.0, current Hba1c is 6.5. Will recommend the following: Continue INSULIN pump settings with HUMALOG U-200 insulin: BASAL rate 0.6 at midnight and 0.7 at 6 AM BOLUS settings: Carb ratio 1:4 until 5 pm, then 1:3,  Sensitivity = 1: 80, target 120 12 AM to 6 AM and then 110 Active insulin 5 hours  Watch for low. Pt undershoots carbs to prevent hypoglycemia Mounjaro 7.5 mg weekly - for weight loss. No S/E  No known contraindications to any of above medications Glucagon - pt has it  Hyperlipidemia -Last LDL near goal: 96 -on rosuvastatin 10 mg QD -Follow low fat diet and exercise   -Blood pressure goal <140/90 - Microalbumin/creatinine at goal < 30 -not on ACE/ARB  -diet changes including salt restriction -limit eating outside -counseled BP targets per standards of diabetes care -Uncontrolled blood pressure can lead to retinopathy, nephropathy and cardiovascular and atherosclerotic heart disease  Reviewed and counseled on: -A1C target -Blood sugar targets -Complications of uncontrolled diabetes  -Checking blood sugar before meals and bedtime and bring log next visit -All medications with mechanism of action and side effects -Hypoglycemia management: rule of 15's, Glucagon Emergency Kit and medical alert ID -low-carb low-fat plate-method diet -At least 20 minutes of physical activity per day -Annual dilated retinal eye exam and foot exam -compliance and follow up needs -follow up as scheduled or earlier if problem gets worse  Call if blood sugar is less than 70 or consistently above 250    Take a 15 gm snack of carbohydrate at bedtime before you go to sleep if your blood sugar is less than 100.    If you are going to fast after midnight for a test or procedure, ask your physician for instructions on how to reduce/decrease your insulin dose.    Call if blood sugar is less than 70 or consistently above 250  -Treating a low sugar by rule of 15  (  15 gms of sugar every 15 min until sugar is more than 70) If you feel your sugar is low, test your sugar to be sure If your sugar is low (less than 70), then take 15 grams of a fast acting Carbohydrate (3-4 glucose tablets or glucose gel or 4  ounces of juice or regular soda) Recheck your sugar 15 min after treating low to make sure it is more than 70 If sugar is still less than 70, treat again with 15 grams of carbohydrate          Don't drive the hour of hypoglycemia  If unconscious/unable to eat or drink by mouth, use glucagon injection or nasal spray baqsimi and call 911. Can repeat again in 15 min if still unconscious.  Return in about 3 months (around 07/31/2023) for visit and 8 am labs before next visit.   I have reviewed current medications, nurse's notes, allergies, vital signs, past medical and surgical history, family medical history, and social history for this encounter. Counseled patient on symptoms, examination findings, lab findings, imaging results, treatment decisions and monitoring and prognosis. The patient understood the recommendations and agrees with the treatment plan. All questions regarding treatment plan were fully answered.  Altamese Thurston, MD  04/30/23    History of Present Illness Andre Brown is a 42 y.o. year old male who presents for evaluation of Type 1 diabetes mellitus and hypothyroidism.  C/o dizzy spell, no palpitations/fractures/osteoporosis No fatigue/hyperdefecation/heat intolerance  No dysphagia/dysphonia/dyspnea  04/21/23 TSH 0.28   Andre Brown was first diagnosed of diabetes in 1999 at 71.   Diabetes education +  Home diabetes regimen:   Recent history:     INSULIN pump settings with HUMALOG U-200 insulin: BASAL rate 0.6 at midnight and 0.7 at 6 AM BOLUS settings: Carb ratio 1:4 until 5 pm, then 1:3, SENSITIVITY = 1: 80, target 120 12 AM to 6 AM and then 110 Active insulin 5 hours Correction threshold 130, target 120 at midnight and 110 at 6 AM  Non-insulin hypoglycemic drugs: Mounjaro 5 mg weekly  Prior history:   He was reportedly started on insulin 2002 He has been taking Lantus insulin previously and subsequently [place] Also in 2022 he was given  Trulicity  PREVIOUS INSULIN regimen :50 units Tresiba in the morning daily NOVOLOG mealtime doses 12 units + carbohydrate ratio 1:5  COMPLICATIONS -  MI/Stroke -  retinopathy, last eye exam 2023 -  neuropathy -  nephropathy  BLOOD SUGAR DATA Unable to connect to [facility]   Physical Exam  There were no vitals taken for this visit.   Constitutional: well developed, well nourished Head: normocephalic, atraumatic Eyes: sclera anicteric, no redness Neck: supple Lungs: normal respiratory effort Neurology: alert and oriented Skin: dry, no appreciable rashes Musculoskeletal: no appreciable defects Psychiatric: normal mood and affect Diabetic Foot Exam - Simple   No data filed      Current Medications Patient's Medications  New Prescriptions   LEVOTHYROXINE (SYNTHROID) 150 MCG TABLET    Take 1 tablet (150 mcg total) by mouth daily.  Previous Medications   B-D ULTRAFINE III SHORT PEN 31G X 8 MM MISC    USE 4 TIMES A DAY   CONTINUOUS BLOOD GLUC SENSOR (DEXCOM G6 SENSOR) MISC    3 each by Other route See admin instructions. Apply one sensor to body once every 10 days; E11.9   CONTINUOUS BLOOD GLUC TRANSMIT (DEXCOM G6 TRANSMITTER) MISC    USE AS DIRECTED   DICLOFENAC (VOLTAREN) 75  MG EC TABLET    TAKE 1 TABLET (75 MG TOTAL) BY MOUTH 2 (TWO) TIMES DAILY BETWEEN MEALS AS NEEDED.   FUROSEMIDE (LASIX) 20 MG TABLET    Take 20 mg by mouth daily as needed.   GLUCAGON (GLUCAGON EMERGENCY) 1 MG INJECTION    Inject 1 mg into the muscle once as needed for up to 1 dose.   HUMALOG KWIKPEN 200 UNIT/ML KWIKPEN    INJECT UP TO 100 UNITS VIA INSULIN PUMP DAILY   INSULIN ASPART (NOVOLOG FLEXPEN) 100 UNIT/ML FLEXPEN    Inject 35 Units into the skin 3 (three) times daily with meals.   INSULIN DEGLUDEC (TRESIBA FLEXTOUCH) 100 UNIT/ML FLEXTOUCH PEN    Inject 50 Units into the skin daily.   INSULIN DISPOSABLE PUMP (OMNIPOD 5 G6 INTRO, GEN 5,) KIT    Change pod every 3 days   INSULIN DISPOSABLE PUMP (OMNIPOD  5 G6 POD, GEN 5,) MISC    Change pod every 2 days   KETOCONAZOLE (NIZORAL) 2 % CREAM    Apply 1 application topically daily.   ROSUVASTATIN (CRESTOR) 10 MG TABLET    Take 10 mg by mouth at bedtime.   SILDENAFIL (VIAGRA) 100 MG TABLET    TAKE 0.5-1 TABLETS (50-100 MG TOTAL) DAILY AS NEEDED FOR ERECTILE DYSFUNCTION. (6 PER 25 DAYS)   SYRINGE/NEEDLE, DISP, 30G X 1/2" 1 ML MISC    Use to inject insulin into to pump   TIRZEPATIDE (MOUNJARO) 10 MG/0.5ML PEN    Inject 10 mg into the skin once a week.   TRIAMCINOLONE CREAM (KENALOG) 0.1 %    Apply 1 application topically 2 (two) times daily.  Modified Medications   No medications on file  Discontinued Medications   LEVOTHYROXINE (SYNTHROID) 175 MCG TABLET    Take 1 tablet (175 mcg total) by mouth daily.    Allergies Allergies  Allergen Reactions   Lisinopril Other (See Comments)   Loratadine Other (See Comments)   Losartan Potassium Other (See Comments)   Shellfish Allergy     Itchy throat    Past Medical History Past Medical History:  Diagnosis Date   Diabetes mellitus without complication (HCC)    History of cataract    Hypothyroidism    SOBOE (shortness of breath on exertion)    Swelling of both lower extremities    Thyroid disease     Past Surgical History Past Surgical History:  Procedure Laterality Date   CATARACT EXTRACTION, BILATERAL  2021   WISDOM TOOTH EXTRACTION  2015    Family History family history includes Colon polyps in his maternal uncle. He was adopted.  Social History Social History   Socioeconomic History   Marital status: Married    Spouse name: Tyveon Staudacher   Number of children: 2   Years of education: Not on file   Highest education level: Not on file  Occupational History   Occupation: Production manager  Tobacco Use   Smoking status: Never   Smokeless tobacco: Never  Substance and Sexual Activity   Alcohol use: Yes   Drug use: No   Sexual activity: Not on file  Other Topics Concern   Not  on file  Social History Narrative   Not on file   Social Determinants of Health   Financial Resource Strain: Not on file  Food Insecurity: Not on file  Transportation Needs: Not on file  Physical Activity: Not on file  Stress: Not on file  Social Connections: Not on file  Intimate Partner Violence:  Not on file    Lab Results  Component Value Date   HGBA1C 6.5 (A) 02/24/2023   HGBA1C 7.7 (H) 11/21/2022   HGBA1C 7.5 (H) 05/12/2022   Lab Results  Component Value Date   CHOL 161 05/12/2022   Lab Results  Component Value Date   HDL 47.90 05/12/2022   Lab Results  Component Value Date   LDLCALC 96 05/12/2022   Lab Results  Component Value Date   TRIG 87.0 05/12/2022   Lab Results  Component Value Date   CHOLHDL 3 05/12/2022   Lab Results  Component Value Date   CREATININE 1.25 11/21/2022   Lab Results  Component Value Date   GFR 70.55 11/21/2022   Lab Results  Component Value Date   MICROALBUR <0.7 11/26/2022      Component Value Date/Time   NA 139 11/21/2022 0908   K 4.2 11/21/2022 0908   CL 103 11/21/2022 0908   CO2 30 11/21/2022 0908   GLUCOSE 122 (H) 11/21/2022 0908   BUN 12 11/21/2022 0908   CREATININE 1.25 11/21/2022 0908   CREATININE 1.00 08/05/2014 1434   CALCIUM 9.3 11/21/2022 0908   PROT 7.1 11/21/2022 0908   ALBUMIN 4.1 11/21/2022 0908   AST 25 11/21/2022 0908   ALT 22 11/21/2022 0908   ALKPHOS 67 11/21/2022 0908   BILITOT 0.5 11/21/2022 0908   GFRNONAA >60 09/18/2022 1615   GFRAA >60 09/22/2018 1505      Latest Ref Rng & Units 11/21/2022    9:08 AM 09/18/2022    4:15 PM 05/20/2022   10:23 AM  BMP  Glucose 70 - 99 mg/dL 272  536  644   BUN 6 - 23 mg/dL 12  13  12    Creatinine 0.40 - 1.50 mg/dL 0.34  7.42  5.95   Sodium 135 - 145 mEq/L 139  137  138   Potassium 3.5 - 5.1 mEq/L 4.2  4.6  4.4   Chloride 96 - 112 mEq/L 103  105  101   CO2 19 - 32 mEq/L 30  27  26    Calcium 8.4 - 10.5 mg/dL 9.3  9.1  9.3        Component Value  Date/Time   WBC 12.7 (H) 09/18/2022 1615   RBC 4.72 09/18/2022 1615   HGB 14.2 09/18/2022 1615   HCT 42.0 09/18/2022 1615   PLT 226 09/18/2022 1615   MCV 89.0 09/18/2022 1615   MCV 86.5 08/05/2014 1437   MCH 30.1 09/18/2022 1615   MCHC 33.8 09/18/2022 1615   RDW 12.1 09/18/2022 1615   LYMPHSABS 1.1 09/18/2022 1615   MONOABS 0.7 09/18/2022 1615   EOSABS 0.0 09/18/2022 1615   BASOSABS 0.0 09/18/2022 1615     Parts of this note may have been dictated using voice recognition software. There may be variances in spelling and vocabulary which are unintentional. Not all errors are proofread. Please notify the Thereasa Parkin if any discrepancies are noted or if the meaning of any statement is not clear.  -+

## 2023-05-06 ENCOUNTER — Other Ambulatory Visit: Payer: Self-pay | Admitting: "Endocrinology

## 2023-05-06 DIAGNOSIS — Z0000 Encounter for general adult medical examination without abnormal findings: Secondary | ICD-10-CM | POA: Diagnosis not present

## 2023-05-06 DIAGNOSIS — E7800 Pure hypercholesterolemia, unspecified: Secondary | ICD-10-CM | POA: Diagnosis not present

## 2023-05-10 ENCOUNTER — Other Ambulatory Visit: Payer: Self-pay | Admitting: Endocrinology

## 2023-05-10 DIAGNOSIS — E1065 Type 1 diabetes mellitus with hyperglycemia: Secondary | ICD-10-CM

## 2023-05-15 ENCOUNTER — Ambulatory Visit: Payer: BC Managed Care – PPO | Admitting: "Endocrinology

## 2023-06-11 ENCOUNTER — Ambulatory Visit
Admission: EM | Admit: 2023-06-11 | Discharge: 2023-06-11 | Disposition: A | Discharge status: Home / Self Care | Payer: BC Managed Care – PPO | Attending: Physician Assistant | Admitting: Physician Assistant

## 2023-06-11 DIAGNOSIS — R350 Frequency of micturition: Secondary | ICD-10-CM | POA: Insufficient documentation

## 2023-06-11 LAB — POCT URINALYSIS DIP (MANUAL ENTRY)
Bilirubin, UA: NEGATIVE
Blood, UA: NEGATIVE
Glucose, UA: NEGATIVE mg/dL
Ketones, POC UA: NEGATIVE mg/dL
Leukocytes, UA: NEGATIVE
Nitrite, UA: NEGATIVE
Protein Ur, POC: NEGATIVE mg/dL
Spec Grav, UA: 1.01 (ref 1.010–1.025)
Urobilinogen, UA: 0.2 E.U./dL
pH, UA: 7 (ref 5.0–8.0)

## 2023-06-11 NOTE — Discharge Instructions (Signed)
Please follow up with primary care provider as soon as possible.

## 2023-06-11 NOTE — ED Triage Notes (Signed)
"  I am concerned that I have a UTI". "I have had strong urgency to pee, waking me up during night". "I have a burning sensation in right and left lower areas". Drinking more water helps. This has been the last 2 nights. "[facility] + Leuk/Nit on this". No fever. Last Glucose reading (during intake, on pump) result: 116.

## 2023-06-11 NOTE — ED Provider Notes (Signed)
[facility]    CSN: [id number] Arrival date & time: 06/11/23  1631      History   Chief Complaint Chief Complaint  Patient presents with   UTI Symptoms    HPI Andre Brown is a 42 y.o. male.   Patient here today for evaluation of possible UTI.  He states that he has an urgency to urinate, specifically at night when it is waking him up.  He also notes some burning sensation to his right and left lower areas.  He has been drinking more water which seems to help.  He notes that symptoms started about 2 nights ago.  He states he did go to [facility] and get a UTI strep that was positive for leukocytes and nitrites.  He has not any fever.  He denies any nausea or vomiting.  His last glucose reading was around 116.  The history is provided by the patient.    Past Medical History:  Diagnosis Date   Diabetes mellitus without complication (HCC)    History of cataract    Hypothyroidism    SOBOE (shortness of breath on exertion)    Swelling of both lower extremities    Thyroid disease     Patient Active Problem List   Diagnosis Date Noted   Elevated AST (SGOT) 05/23/2022   Elevated LFTs 05/23/2022   Diarrhea 05/23/2022   Generalized abdominal pain 05/23/2022   Change in bowel habit 05/23/2022   Distended abdomen 05/23/2022   Bloating 05/23/2022   Fatty liver 05/23/2022   Diabetes mellitus (HCC) 02/21/2021   At risk for hypoglycemia 02/21/2021   Other fatigue 02/06/2021   SOBOE (shortness of breath on exertion) 02/06/2021   Hyperlipidemia associated with type 2 diabetes mellitus (HCC) 02/06/2021   Other specified hypothyroidism 02/06/2021   At risk for heart disease 02/06/2021   Vitamin D deficiency 02/06/2021   Depression screening 02/06/2021   Weight gain 01/24/2021   Erectile dysfunction 11/14/2019   History of muscle stiffness 12/22/2018   Diabetes (HCC) 12/22/2018   Type 1 diabetes mellitus with hyperglycemia (HCC) 03/09/2018   Acquired hypothyroidism  03/09/2018    Past Surgical History:  Procedure Laterality Date   CATARACT EXTRACTION, BILATERAL  2021   WISDOM TOOTH EXTRACTION  2015       Home Medications    Prior to Admission medications   Medication Sig Start Date End Date Taking? Authorizing Provider  Continuous Blood Gluc Sensor (DEXCOM G6 SENSOR) MISC 3 each by Other route See admin instructions. Apply one sensor to body once every 10 days; E11.9 10/20/22  Yes Reather Littler, MD  Continuous Blood Gluc Transmit (DEXCOM G6 TRANSMITTER) MISC USE AS DIRECTED 10/20/22  Yes Reather Littler, MD  diclofenac (VOLTAREN) 75 MG EC tablet TAKE 1 TABLET (75 MG TOTAL) BY MOUTH 2 (TWO) TIMES DAILY BETWEEN MEALS AS NEEDED. 02/23/23  Yes Kathryne Hitch, MD  HUMALOG KWIKPEN 200 UNIT/ML KwikPen INJECT UP TO 100 UNITS VIA INSULIN PUMP DAILY 04/29/23  Yes Motwani, Komal, MD  insulin aspart (NOVOLOG FLEXPEN) 100 UNIT/ML FlexPen Inject 35 Units into the skin 3 (three) times daily with meals.   Yes [provider]  insulin degludec (TRESIBA FLEXTOUCH) 100 UNIT/ML FlexTouch Pen INJECT 50 UNITS INTO THE SKIN DAILY 05/06/23  Yes Motwani, Komal, MD  Insulin Disposable Pump (OMNIPOD 5 G6 INTRO, GEN 5,) KIT Change pod every 3 days 02/12/22  Yes Reather Littler, MD  levothyroxine (SYNTHROID) 150 MCG tablet Take 1 tablet (150 mcg total) by mouth  daily. 04/30/23  Yes Motwani, Komal, MD  pioglitazone (ACTOS) 15 MG tablet Take 15 mg by mouth daily. 04/17/23  Yes [provider]  rosuvastatin (CRESTOR) 10 MG tablet Take 10 mg by mouth at bedtime. 02/05/21  Yes [provider]  tirzepatide Greggory Keen) 10 MG/0.5ML Pen Inject 10 mg into the skin once a week. Patient taking differently: Inject 7.5 mg into the skin once a week. 7.5 mg weekly 03/30/23  Yes Motwani, Komal, MD  B-D ULTRAFINE III SHORT PEN 31G X 8 MM MISC USE 4 TIMES A DAY 08/29/20   Romero Belling, MD  clobetasol cream (TEMOVATE) 0.05 % Apply 1 Application topically 2 (two) times daily. 04/22/23    [provider]  furosemide (LASIX) 20 MG tablet Take 20 mg by mouth daily as needed. 01/10/21   [provider]  glucagon (GLUCAGON EMERGENCY) 1 MG injection Inject 1 mg into the muscle once as needed for up to 1 dose. 05/16/19   Romero Belling, MD  Insulin Disposable Pump (OMNIPOD 5 G6 PODS, GEN 5,) MISC CHANGE POD EVERY 3 DAYS AS DIRECTED 05/11/23   Motwani, Carin Hock, MD  ketoconazole (NIZORAL) 2 % cream Apply 1 application topically daily. 08/31/19   Hall-Potvin, [place], PA-C  sildenafil (VIAGRA) 100 MG tablet TAKE 0.5-1 TABLETS (50-100 MG TOTAL) DAILY AS NEEDED FOR ERECTILE DYSFUNCTION. (6 PER 25 DAYS) 05/24/22   Reather Littler, MD  Syringe/Needle, Disp, 30G X 1/2" 1 ML MISC Use to inject insulin into to pump 03/07/22   Reather Littler, MD  triamcinolone cream (KENALOG) 0.1 % Apply 1 application topically 2 (two) times daily. 08/31/19   Hall-Potvin, [place], PA-C    Family History Family History  Adopted: Yes  Problem Relation Age of Onset   Colon polyps Maternal Uncle    Diabetes Neg Hx    Esophageal cancer Neg Hx    Inflammatory bowel disease Neg Hx    Liver disease Neg Hx    Pancreatic cancer Neg Hx    Rectal cancer Neg Hx    Stomach cancer Neg Hx    Colon cancer Neg Hx     Social History Social History   Tobacco Use   Smoking status: Never   Smokeless tobacco: Never  Vaping Use   Vaping status: Never Used  Substance Use Topics   Alcohol use: Yes    Comment: Very limited   Drug use: No     Allergies   Lisinopril, Loratadine, Losartan potassium, and Shellfish allergy   Review of Systems Review of Systems  Constitutional:  Negative for chills and fever.  Eyes:  Negative for discharge and redness.  Gastrointestinal:  Negative for abdominal distention, abdominal pain, nausea and vomiting.  Genitourinary:  Positive for dysuria, frequency and urgency.  Skin:  Negative for color change and wound.  Neurological:  Negative for numbness.     Physical  Exam Triage Vital Signs ED Triage Vitals  Encounter Vitals Group     BP 06/11/23 1708 130/80     Systolic BP Percentile --      Diastolic BP Percentile --      Pulse Rate 06/11/23 1708 78     Resp 06/11/23 1708 18     Temp 06/11/23 1708 98.8 F (37.1 C)     Temp Source 06/11/23 1708 Oral     SpO2 06/11/23 1708 99 %     Weight 06/11/23 1704 216 lb (98 kg)     Height 06/11/23 1704 5' 8.5" (1.74 m)     Head  Circumference --      Peak Flow --      Pain Score 06/11/23 1658 0     Pain Loc --      Pain Education --      Exclude from Growth Chart --    No data found.  Updated Vital Signs BP 130/80 (BP Location: Left Arm)   Pulse 78   Temp 98.8 F (37.1 C) (Oral)   Resp 18   Ht 5' 8.5" (1.74 m)   Wt 216 lb (98 kg)   SpO2 99%   BMI 32.36 kg/m   Visual Acuity Right Eye Distance:   Left Eye Distance:   Bilateral Distance:    Right Eye Near:   Left Eye Near:    Bilateral Near:     Physical Exam Vitals and nursing note reviewed.  Constitutional:      General: He is not in acute distress.    Appearance: Normal appearance. He is not ill-appearing.  HENT:     Head: Normocephalic and atraumatic.  Eyes:     Conjunctiva/sclera: Conjunctivae normal.  Cardiovascular:     Rate and Rhythm: Normal rate.  Pulmonary:     Effort: Pulmonary effort is normal. No respiratory distress.  Neurological:     Mental Status: He is alert.  Psychiatric:        Mood and Affect: Mood normal.        Behavior: Behavior normal.        Thought Content: Thought content normal.      UC Treatments / Results  Labs (all labs ordered are listed, but only abnormal results are displayed) Labs Reviewed  URINE CULTURE  POCT URINALYSIS DIP (MANUAL ENTRY)  CYTOLOGY, (ORAL, ANAL, URETHRAL) ANCILLARY ONLY    EKG   Radiology No results found.  Procedures Procedures (including critical care time)  Medications Ordered in UC Medications - No data to display  Initial Impression / Assessment  and Plan / UC Course  I have reviewed the triage vital signs and the nursing notes.  Pertinent labs & imaging results that were available during my care of the patient were reviewed by me and considered in my medical decision making (see chart for details).    Discussed UA results at this time- no sign of  UTI- will order culture to definitively rule out.  Recommended follow-up with PCP as he may need referral to urology as well as other labs including PSA.  Recommended sooner follow-up in ED with any worsening or further concerns.  Patient expresses understanding.  Final Clinical Impressions(s) / UC Diagnoses   Final diagnoses:  Urinary frequency     Discharge Instructions      Please follow up with primary care provider as soon as possible.      ED Prescriptions   None    PDMP not reviewed this encounter.   Tomi Bamberger, PA-C 06/15/23 [phone]

## 2023-06-12 LAB — CYTOLOGY, (ORAL, ANAL, URETHRAL) ANCILLARY ONLY
Chlamydia: NEGATIVE
Comment: NEGATIVE
Comment: NORMAL
Neisseria Gonorrhea: NEGATIVE

## 2023-06-12 LAB — URINE CULTURE: Culture: NO GROWTH

## 2023-06-18 ENCOUNTER — Encounter: Payer: Self-pay | Admitting: "Endocrinology

## 2023-06-19 ENCOUNTER — Other Ambulatory Visit: Payer: Self-pay

## 2023-06-19 DIAGNOSIS — E1065 Type 1 diabetes mellitus with hyperglycemia: Secondary | ICD-10-CM

## 2023-06-19 MED ORDER — DEXCOM G6 SENSOR MISC: 3.0000 | 12 refills | Status: DC

## 2023-06-22 DIAGNOSIS — R42 Dizziness and giddiness: Secondary | ICD-10-CM | POA: Diagnosis not present

## 2023-06-22 DIAGNOSIS — M545 Low back pain, unspecified: Secondary | ICD-10-CM | POA: Diagnosis not present

## 2023-06-22 DIAGNOSIS — R350 Frequency of micturition: Secondary | ICD-10-CM | POA: Diagnosis not present

## 2023-06-23 DIAGNOSIS — N401 Enlarged prostate with lower urinary tract symptoms: Secondary | ICD-10-CM | POA: Diagnosis not present

## 2023-06-23 DIAGNOSIS — R350 Frequency of micturition: Secondary | ICD-10-CM | POA: Diagnosis not present

## 2023-06-26 ENCOUNTER — Telehealth: Payer: Self-pay | Admitting: Genetic Counselor

## 2023-06-26 NOTE — Telephone Encounter (Signed)
Per IB Message on 06/24/23; I called patient and scheduled appointment for Genetic counseling and lab. Patient is aware of date and time.

## 2023-07-08 DIAGNOSIS — J209 Acute bronchitis, unspecified: Secondary | ICD-10-CM | POA: Diagnosis not present

## 2023-07-08 DIAGNOSIS — Z77120 Contact with and (suspected) exposure to mold (toxic): Secondary | ICD-10-CM | POA: Diagnosis not present

## 2023-07-14 ENCOUNTER — Inpatient Hospital Stay: Payer: BC Managed Care – PPO | Attending: Internal Medicine | Admitting: Genetic Counselor

## 2023-07-14 ENCOUNTER — Other Ambulatory Visit: Payer: Self-pay | Admitting: Genetic Counselor

## 2023-07-14 ENCOUNTER — Inpatient Hospital Stay: Payer: BC Managed Care – PPO

## 2023-07-14 ENCOUNTER — Encounter: Payer: Self-pay | Admitting: Genetic Counselor

## 2023-07-14 DIAGNOSIS — Z800 Family history of malignant neoplasm of digestive organs: Secondary | ICD-10-CM

## 2023-07-14 DIAGNOSIS — Z1379 Encounter for other screening for genetic and chromosomal anomalies: Secondary | ICD-10-CM

## 2023-07-14 DIAGNOSIS — Z803 Family history of malignant neoplasm of breast: Secondary | ICD-10-CM

## 2023-07-14 LAB — GENETIC SCREENING ORDER

## 2023-07-14 NOTE — Progress Notes (Signed)
REFERRING PROVIDER: Renford Dills, MD [address] [place],  [place] [address]  PRIMARY PROVIDER:  Renford Dills, MD  PRIMARY REASON FOR VISIT:  1. Family history of breast cancer   2. Family history of pancreatic cancer    HISTORY OF PRESENT ILLNESS:   Andre Brown, a 42 y.o. male, was seen for a [place] cancer genetics consultation at the request of Dr. Nehemiah Brown due to a family history of cancer.  Andre Brown presents to clinic today to discuss the possibility of a hereditary predisposition to cancer, to discuss genetic testing, and to further clarify his future cancer risks, as well as potential cancer risks for family members.   Andre Brown is a 42 y.o. male with no personal history of cancer.    RISK FACTORS:  Colonoscopy: yes;  normal colonoscopy in 2023, will repeat in 10 years . PSA: reports normal PSA screening in 2024  Past Medical History:  Diagnosis Date   Diabetes mellitus without complication (HCC)    History of cataract    Hypothyroidism    SOBOE (shortness of breath on exertion)    Swelling of both lower extremities    Thyroid disease     Past Surgical History:  Procedure Laterality Date   CATARACT EXTRACTION, BILATERAL  2021   WISDOM TOOTH EXTRACTION  2015    Social History   Socioeconomic History   Marital status: Married    Spouse name: Andre Brown   Number of children: 2   Years of education: Not on file   Highest education level: Not on file  Occupational History   Occupation: Production manager  Tobacco Use   Smoking status: Never   Smokeless tobacco: Never  Vaping Use   Vaping status: Never Used  Substance and Sexual Activity   Alcohol use: Yes    Comment: Very limited   Drug use: No   Sexual activity: Yes    Birth control/protection: None    Comment: Last encounter: first week of Sept 2024, Recieved oral sex.  Other Topics Concern   Not on file  Social History Narrative   Not on file   Social Determinants of  Health   Financial Resource Strain: Not on file  Food Insecurity: Not on file  Transportation Needs: Not on file  Physical Activity: Not on file  Stress: Not on file  Social Connections: Not on file     FAMILY HISTORY:  We obtained a detailed, 4-generation family history.  Significant diagnoses are listed below: Family History  Problem Relation Age of Onset   Breast cancer Mother 22       triple negative   Cancer Maternal Grandmother        unknown type   Colon polyps Maternal Uncle    Cancer Maternal Great-grandmother        unknown type   Pancreatic cancer Other        maternal great uncle's son   Diabetes Neg Hx    Esophageal cancer Neg Hx    Inflammatory bowel disease Neg Hx    Liver disease Neg Hx    Rectal cancer Neg Hx    Stomach cancer Neg Hx    Colon cancer Neg Hx      Andre Brown mother was diagnosed with triple negative breast cancer at age 17, she is currently 78. His maternal grandmother died in her 101s due to metastatic cancer (unknown type). One maternal great aunt (grandmother's sister) was diagnosed with breast cancer and a second  maternal great aunt (grandmother's sister) was diagnosed with an unknown type of cancer, both are deceased. Andre Brown reports one maternal first cousin once removed (grandmother's brother's son) was diagnosed with pancreatic cancer. His maternal great grandmother (grandmother's mother) was diagnosed with an unknown type of cancer, she died in her 35s. Of note, Andre Brown does not have any information about his paternal family medical history. Andre Brown is unaware of previous family history of genetic testing for hereditary cancer risks. There is no reported Ashkenazi Jewish ancestry.   GENETIC COUNSELING ASSESSMENT: Andre Brown is a 42 y.o. male with a family history of cancer which is somewhat suggestive of a hereditary predisposition to cancer given his mother's triple negative breast cancer. We, therefore, discussed and  recommended the following at today's visit.   DISCUSSION: We discussed that 5 - 10% of cancer is hereditary, with most cases of hereditary breast cancer associated with BRCA1/2.  There are other genes that can be associated with hereditary breast cancer syndromes.  We discussed that testing is beneficial for several reasons, including knowing about other cancer risks, identifying potential screening and risk-reduction options that may be appropriate, and to understanding if other family members could be at risk for cancer and allowing them to undergo genetic testing.  We reviewed the characteristics, features and inheritance patterns of hereditary cancer syndromes. We also discussed genetic testing, including the appropriate family members to test, the process of testing, insurance coverage and turn-around-time for results. We discussed the implications of a negative, positive, carrier and/or variant of uncertain significant result. We discussed that negative results would be uninformative given that Andre Brown does not have a personal history of cancer. We recommended Andre Brown pursue genetic testing for a panel that contains genes associated with breast and pancreatic cancer.  Andre Brown was offered a common hereditary cancer panel (34 genes) and an expanded pan-cancer panel (71 genes). Andre Brown was informed of the benefits and limitations of each panel, including that expanded pan-cancer panels contain several genes that do not have clear management guidelines at this point in time.  We also discussed that as the number of genes included on a panel increases, the chances of variants of uncertain significance increases.  After considering the benefits and limitations of each gene panel, Andre Brown elected to have [facility] CancerNext-Expanded Panel.   Based on Andre Brown family history of cancer, he meets medical criteria for genetic testing. Despite that he meets criteria, he may still have an out  of pocket cost. We discussed that if his out of pocket cost for testing is over $100, the laboratory should contact them to discuss self-pay prices, patient pay assistance programs, if applicable, and other billing options.  We discussed that some people do not want to undergo genetic testing due to fear of genetic discrimination.  A federal law called the Genetic Information Non-Discrimination Act (GINA) of 2008 helps protect individuals against genetic discrimination based on their genetic test results.  It impacts both health insurance and employment.  With health insurance, it protects against increased premiums, being kicked off insurance or being forced to take a test in order to be insured.  For employment it protects against hiring, firing and promoting decisions based on genetic test results.  GINA does not apply to those in the [employer], those who work for companies with less than 15 employees, and new life insurance or long-term disability insurance policies.  Health status due to a cancer diagnosis is not protected under GINA.  PLAN: After considering the risks, benefits, and limitations, Mr. Kosik provided informed consent to pursue genetic testing and the blood sample was sent to [facility] for analysis of the CancerNext-Expanded Panel. Results should be available within approximately 2-3 weeks' time, at which point they will be disclosed by telephone to Andre Brown, as will any additional recommendations warranted by these results. Andre Brown will receive a summary of his genetic counseling visit and a copy of his results once available. This information will also be available in Epic.   Mr. Kortan questions were answered to his satisfaction today. Our contact information was provided should additional questions or concerns arise. Thank you for the referral and allowing [place] to share in the care of your patient.   Lalla Brothers, MS, [facility] Genetic  Counselor [email or web address] (P) [phone]  The patient was seen for a total of 35 minutes in face-to-face genetic counseling. The patient was seen alone.  Drs. Pamelia Hoit and/or Mosetta Putt were available to discuss this case as needed.  _______________________________________________________________________ For Office Staff:  Number of people involved in session: 2 Was an Intern/ student involved with case: yes, Asher Muir

## 2023-07-15 ENCOUNTER — Other Ambulatory Visit (HOSPITAL_COMMUNITY): Payer: Self-pay

## 2023-07-15 ENCOUNTER — Telehealth: Payer: Self-pay

## 2023-07-15 NOTE — Telephone Encounter (Signed)
Pharmacy Patient Advocate Encounter   Received notification from Pt Calls Messages that prior authorization for Dexcom G6 sensor is required/requested.   Insurance verification completed.   The patient is insured through [facility] ADVANTAGE/RX ADVANCE .   Per test claim: Refill too soon. PA is not needed at this time. Medication was filled 06/22/23. Next eligible fill date is 08/24/23.

## 2023-07-15 NOTE — Telephone Encounter (Signed)
Patient is stating that his insurance requires prior auth for Dexcom sensors

## 2023-07-22 ENCOUNTER — Other Ambulatory Visit: Payer: Self-pay

## 2023-07-22 DIAGNOSIS — E1065 Type 1 diabetes mellitus with hyperglycemia: Secondary | ICD-10-CM

## 2023-07-22 MED ORDER — OMNIPOD 5 DEXG7G6 PODS GEN 5 MISC: 3 refills | Status: DC

## 2023-07-22 NOTE — Telephone Encounter (Signed)
the office has also needs to send [employer] the prior authorization for the omnipods. Can you also confirm the prior authorization for the omnipods has been submitted to [employer] as well

## 2023-07-23 ENCOUNTER — Other Ambulatory Visit: Payer: Self-pay

## 2023-07-25 ENCOUNTER — Ambulatory Visit (HOSPITAL_COMMUNITY)
Admission: EM | Admit: 2023-07-25 | Discharge: 2023-07-25 | Disposition: A | Discharge status: Home / Self Care | Payer: BC Managed Care – PPO | Attending: Family Medicine | Admitting: Family Medicine

## 2023-07-25 ENCOUNTER — Encounter (HOSPITAL_COMMUNITY): Payer: Self-pay

## 2023-07-25 DIAGNOSIS — S61011A Laceration without foreign body of right thumb without damage to nail, initial encounter: Secondary | ICD-10-CM

## 2023-07-25 NOTE — ED Notes (Signed)
Laceration to left thumb.  Bleeding controlled.  Dropped a sander on left thumb.  Able to move thump, brisk cap refill, reports sensation is normal in distal thumb.  Notified dr hagler.  Patient to continue to register at [facility]

## 2023-07-25 NOTE — ED Triage Notes (Signed)
Pt was working on his car when he cut his right thumb on a piece of metal. Last Tetanus was in 2022.

## 2023-07-27 NOTE — ED Provider Notes (Addendum)
[facility]   [id number] 07/25/23 Arrival Time: 1753  ASSESSMENT & PLAN:  1. Laceration of right thumb without foreign body without damage to nail, initial encounter    I have personally viewed and independently interpreted the imaging studies ordered this visit. R thumb: no appreciable fracture or radiopaque FB.  Procedure: Laceration Repair Verbal consent obtained. Patient provided with risks and alternatives to the procedure. Wound copiously irrigated with NS then cleansed with betadine. Local anesthesia: Lidocaine 1% without epinephrine. Flushed copiously. Wound carefully explored. No foreign body, tendon injury, or nonviable tissue were noted. Using sterile technique, 4 interrupted 4-0 Prolene sutures were placed to reapproximate the wound. Procedure tolerated well. No complications. Minimal bleeding. Advised to look for and return for any signs of infection such as redness, swelling, discharge, or worsening pain. Return for suture removal in 7 days. Thumb with FROM, normal distal sensation, normal capillary refill after procedure.  OTC analgesics as needed.  Reviewed expectations re: course of current medical issues. Questions answered. Outlined signs and symptoms indicating need for more acute intervention. Patient verbalized understanding. After Visit Summary given.   SUBJECTIVE:  Andre Brown is a 42 y.o. male who presents with a laceration of his R prox thumb. Thumb vs electric sander. Bleeding controlled. Flushed under running water PTA. Denies extremity sensation changes or weakness.   Td UTD: 2022  OBJECTIVE:  Vitals:   07/25/23 1816  BP: 119/81  Pulse: 75  Resp: 18  Temp: 98.4 F (36.9 C)  TempSrc: Oral  SpO2: 98%     General appearance: alert; no distress RUE: linear laceration of dorsal proximal thumb; size: approx 1.5 cm; clean wound edges, no foreign bodies; without appreciable tendon injury; with active bleeding; normal distal sensation  and capillary refill Psychological: alert and cooperative; normal mood and affect  Labs Reviewed - No data to display  NKDA  No results found.   Allergies  Allergen Reactions   Lisinopril Other (See Comments)   Loratadine Other (See Comments)   Losartan Potassium Other (See Comments)   Shellfish Allergy     Itchy throat    Past Medical History:  Diagnosis Date   Diabetes mellitus without complication (HCC)    History of cataract    Hypothyroidism    SOBOE (shortness of breath on exertion)    Swelling of both lower extremities    Thyroid disease    Social History   Socioeconomic History   Marital status: Married    Spouse name: Vincent Streater   Number of children: 2   Years of education: Not on file   Highest education level: Not on file  Occupational History   Occupation: Production manager  Tobacco Use   Smoking status: Never   Smokeless tobacco: Never  Vaping Use   Vaping status: Never Used  Substance and Sexual Activity   Alcohol use: Yes    Comment: Very limited   Drug use: No   Sexual activity: Yes    Birth control/protection: None    Comment: Last encounter: first week of Sept 2024, Recieved oral sex.  Other Topics Concern   Not on file  Social History Narrative   Not on file   Social Determinants of Health   Financial Resource Strain: Not on file  Food Insecurity: Not on file  Transportation Needs: Not on file  Physical Activity: Not on file  Stress: Not on file  Social Connections: Not on file          Mardella Layman, MD  07/27/23 0930    Mardella Layman, MD 07/27/23 [phone]

## 2023-08-04 ENCOUNTER — Ambulatory Visit (HOSPITAL_COMMUNITY)
Admission: RE | Admit: 2023-08-04 | Discharge: 2023-08-04 | Disposition: A | Discharge status: Home / Self Care | Payer: BC Managed Care – PPO | Source: Ambulatory Visit | Attending: Internal Medicine | Admitting: Internal Medicine

## 2023-08-04 ENCOUNTER — Encounter: Payer: Self-pay | Admitting: Genetic Counselor

## 2023-08-04 ENCOUNTER — Telehealth: Payer: Self-pay | Admitting: Genetic Counselor

## 2023-08-04 DIAGNOSIS — Z4802 Encounter for removal of sutures: Secondary | ICD-10-CM

## 2023-08-04 DIAGNOSIS — Z1379 Encounter for other screening for genetic and chromosomal anomalies: Secondary | ICD-10-CM | POA: Insufficient documentation

## 2023-08-04 NOTE — ED Triage Notes (Signed)
Pt here today to have 4 sutures removed form hi left thumb.

## 2023-08-04 NOTE — Telephone Encounter (Signed)
I contacted Mr. Zachry to discuss his genetic testing results. No pathogenic variants were identified in the 71 genes analyzed. Of note, a variant of uncertain significance was identified in the SDHA gene. Detailed clinic note to follow.  The test report has been scanned into EPIC and is located under the Molecular Pathology section of the Results Review tab.  A portion of the result report is included below for reference.   Lalla Brothers, MS, [facility] Genetic Counselor [email or web address] (P) [phone]

## 2023-08-07 ENCOUNTER — Encounter: Payer: Self-pay | Admitting: Genetic Counselor

## 2023-08-07 ENCOUNTER — Ambulatory Visit: Payer: Self-pay | Admitting: Genetic Counselor

## 2023-08-07 DIAGNOSIS — Z1379 Encounter for other screening for genetic and chromosomal anomalies: Secondary | ICD-10-CM

## 2023-08-07 NOTE — Progress Notes (Signed)
HPI:   Andre Brown was previously seen in the [facility] due to a family history of cancer and concerns regarding a hereditary predisposition to cancer. Please refer to our prior cancer genetics clinic note for more information regarding our discussion, assessment and recommendations, at the time. Andre Brown recent genetic test results were disclosed to him, as were recommendations warranted by these results. These results and recommendations are discussed in more detail below.  CANCER HISTORY:  Oncology History   No history exists.   FAMILY HISTORY:  We obtained a detailed, 4-generation family history.  Significant diagnoses are listed below:      Family History  Problem Relation Age of Onset   Breast cancer Mother 69        triple negative   Cancer Maternal Grandmother          unknown type   Colon polyps Maternal Uncle     Cancer Maternal Great-grandmother          unknown type   Pancreatic cancer Other          maternal great uncle's son   Diabetes Neg Hx     Esophageal cancer Neg Hx     Inflammatory bowel disease Neg Hx     Liver disease Neg Hx     Rectal cancer Neg Hx     Stomach cancer Neg Hx     Colon cancer Neg Hx             Andre Brown mother was diagnosed with triple negative breast cancer at age 35, she is currently 8. His maternal grandmother died in her 88s due to metastatic cancer (unknown type). One maternal great aunt (grandmother's sister) was diagnosed with breast cancer and a second maternal great aunt (grandmother's sister) was diagnosed with an unknown type of cancer, both are deceased. Andre Brown reports one maternal first cousin once removed (grandmother's brother's son) was diagnosed with pancreatic cancer. His maternal great grandmother (grandmother's mother) was diagnosed with an unknown type of cancer, she died in her 66s. Of note, Andre Brown does not have any information about his paternal family medical history. Andre Brown is unaware of previous family history of genetic testing for hereditary cancer risks. There is no reported Ashkenazi Jewish ancestry.   GENETIC TEST RESULTS:  The [facility] CancerNext-Expanded Panel found no pathogenic mutations.  The CancerNext-Expanded gene panel offered by [facility] and includes sequencing, rearrangement, and RNA analysis for the following 71 genes: AIP, ALK, APC, ATM, AXIN2, BAP1, BARD1, BMPR1A, BRCA1, BRCA2, BRIP1, CDC73, CDH1, CDK4, CDKN1B, CDKN2A, CHEK2, CTNNA1, DICER1, FH, FLCN, KIF1B, LZTR1, MAX, MEN1, MET, MLH1, MSH2, MSH3, MSH6, MUTYH, NF1, NF2, NTHL1, PALB2, PHOX2B, PMS2, POT1, PRKAR1A, PTCH1, PTEN, RAD51C, RAD51D, RB1, RET, SDHA, SDHAF2, SDHB, SDHC, SDHD, SMAD4, SMARCA4, SMARCB1, SMARCE1, STK11, SUFU, TMEM127, TP53, TSC1, TSC2, and VHL (sequencing and deletion/duplication); EGFR, EGLN1, HOXB13, KIT, MITF, PDGFRA, POLD1, and POLE (sequencing only); EPCAM and GREM1 (deletion/duplication only).   The test report has been scanned into EPIC and is located under the Molecular Pathology section of the Results Review tab.  A portion of the result report is included below for reference. Genetic testing reported out on 07/31/2023.       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  At this time, it is unknown if this variant is associated with an increased risk for cancer or if it is benign, but most uncertain variants are reclassified  to benign. It should not be used to make medical management decisions. With time, we suspect the laboratory will determine the significance of this variant, if any. If the laboratory reclassifies this variant, we will attempt to contact Andre Brown to discuss it further.   Even though a pathogenic variant was not identified, possible explanations for the cancer in the family may include: There may be no hereditary risk for cancer in the family. The cancers in his family may be  due to other genetic or environmental factors. There may be a gene mutation in one of these genes that current testing methods cannot detect, but that chance is small. There could be another gene that has not yet been discovered, or that we have not yet tested, that is responsible for the cancer diagnoses in the family.  It is also possible there is a hereditary cause for the cancer in the family that Andre Brown did not inherit.  Therefore, it is important to remain in touch with cancer genetics in the future so that we can continue to offer Andre Brown the most up to date genetic testing.   ADDITIONAL GENETIC TESTING:  We discussed with Andre Brown that his genetic testing was fairly extensive.  If there are genes identified to increase cancer risk that can be analyzed in the future, we would be happy to discuss and coordinate this testing at that time.    CANCER SCREENING RECOMMENDATIONS:  Andre Brown test result is considered negative (normal).  This means that we have not identified a hereditary cause for his family history of cancer at this time.   An individual's cancer risk and medical management are not determined by genetic test results alone. Overall cancer risk assessment incorporates additional factors, including personal medical history, family history, and any available genetic information that may result in a personalized plan for cancer prevention and surveillance. Therefore, it is recommended he continue to follow the cancer management and screening guidelines provided by his primary healthcare provider.  RECOMMENDATIONS FOR FAMILY MEMBERS:   Since he did not inherit a mutation in a cancer predisposition gene included on this panel, his children could not have inherited a mutation from him in one of these genes. Other members of the family may still carry a pathogenic variant in one of these genes that Andre Brown did not inherit. Based on the family history, we recommend his  mother have genetic counseling and testing.  We do not recommend familial testing for the SDHA variant of uncertain significance (VUS).  FOLLOW-UP:  Cancer genetics is a rapidly advancing field and it is possible that new genetic tests will be appropriate for him and/or his family members in the future. We encouraged him to remain in contact with cancer genetics on an annual basis so we can update his personal and family histories and let him know of advances in cancer genetics that may benefit this family.   Our contact number was provided. Andre Brown questions were answered to his satisfaction, and he knows he is welcome to call us at anytime with additional questions or concerns.   Lalla Brothers, MS, [facility] Genetic Counselor [email or web address] (P) [phone]

## 2023-08-14 ENCOUNTER — Other Ambulatory Visit: Payer: Self-pay | Admitting: Internal Medicine

## 2023-08-14 ENCOUNTER — Ambulatory Visit
Admission: RE | Admit: 2023-08-14 | Discharge: 2023-08-14 | Disposition: A | Discharge status: Home / Self Care | Payer: BC Managed Care – PPO | Source: Ambulatory Visit | Attending: Internal Medicine | Admitting: Internal Medicine

## 2023-08-14 DIAGNOSIS — M545 Low back pain, unspecified: Secondary | ICD-10-CM

## 2023-08-14 DIAGNOSIS — M47816 Spondylosis without myelopathy or radiculopathy, lumbar region: Secondary | ICD-10-CM | POA: Diagnosis not present

## 2023-08-27 ENCOUNTER — Other Ambulatory Visit: Payer: Self-pay

## 2023-08-27 DIAGNOSIS — E109 Type 1 diabetes mellitus without complications: Secondary | ICD-10-CM

## 2023-08-27 DIAGNOSIS — E038 Other specified hypothyroidism: Secondary | ICD-10-CM

## 2023-08-31 ENCOUNTER — Other Ambulatory Visit: Payer: Self-pay

## 2023-08-31 ENCOUNTER — Other Ambulatory Visit: Payer: BC Managed Care – PPO

## 2023-08-31 DIAGNOSIS — E1065 Type 1 diabetes mellitus with hyperglycemia: Secondary | ICD-10-CM

## 2023-09-01 LAB — HEMOGLOBIN A1C
Hgb A1c MFr Bld: 7.1 %{Hb} — ABNORMAL HIGH (ref <5.7)
Mean Plasma Glucose: 157 mg/dL
eAG (mmol/L): 8.7 mmol/L

## 2023-09-01 LAB — LIPID PANEL
Cholesterol: 170 mg/dL (ref <200)
HDL: 49 mg/dL (ref >=40)
LDL Cholesterol (Calc): 99 mg/dL
Non-HDL Cholesterol (Calc): 121 mg/dL (ref <130)
Total CHOL/HDL Ratio: 3.5 (calc) (ref <5.0)
Triglycerides: 120 mg/dL (ref <150)

## 2023-09-04 ENCOUNTER — Encounter: Payer: Self-pay | Admitting: "Endocrinology

## 2023-09-04 ENCOUNTER — Ambulatory Visit (INDEPENDENT_AMBULATORY_CARE_PROVIDER_SITE_OTHER): Payer: BC Managed Care – PPO | Admitting: "Endocrinology

## 2023-09-04 VITALS — BP 115/64 | HR 68 | Ht 68.5 in | Wt 214.6 lb

## 2023-09-04 DIAGNOSIS — E7800 Pure hypercholesterolemia, unspecified: Secondary | ICD-10-CM | POA: Diagnosis not present

## 2023-09-04 DIAGNOSIS — E1065 Type 1 diabetes mellitus with hyperglycemia: Secondary | ICD-10-CM

## 2023-09-04 DIAGNOSIS — Z9641 Presence of insulin pump (external) (internal): Secondary | ICD-10-CM

## 2023-09-04 DIAGNOSIS — E039 Hypothyroidism, unspecified: Secondary | ICD-10-CM

## 2023-09-04 NOTE — Patient Instructions (Signed)

## 2023-09-04 NOTE — Progress Notes (Signed)
Outpatient Endocrinology Note [place] [place], MD  09/04/23   Alen Blew Jun 02, 1981 [id number]  Referring Provider: Renford Dills, MD Primary Care Provider: Renford Dills, MD Reason for consultation: Subjective   Assessment & Plan  Diagnoses and all orders for this visit:  Type 1 diabetes mellitus with hyperglycemia (HCC)  Insulin pump in place  Pure hypercholesterolemia  Acquired hypothyroidism   Diabetes complicated by hypoglycemia Hba1c goal less than 7.0, current Hba1c is . Will recommend the following: Continue INSULIN pump settings with HUMALOG U-200 insulin: BASAL rate 0.6 at midnight and 0.7 at 6 AM BOLUS settings: Carb ratio 1:3, Sensitivity = 1: 80, target 120 12 AM to 6 AM and then 110 Active insulin 5 hours  Watch for low. Pt undershoots carbs to prevent hypoglycemia Mounjaro 10 mg weekly? - for weight loss. No S/E Actos 15 mg every day  No known contraindications to any of above medications Glucagon - pt has it  Hyperlipidemia -Last LDL near goal: 99 -on rosuvastatin 10 mg every day, not interested in dose increment  -Follow low fat diet and exercise   -Blood pressure goal <140/90 - Microalbumin/creatinine at goal < 30 -not on ACE/ARB  -diet changes including salt restriction -limit eating outside -counseled BP targets per standards of diabetes care -Uncontrolled blood pressure can lead to retinopathy, nephropathy and cardiovascular and atherosclerotic heart disease  Reviewed and counseled on: -A1C target -Blood sugar targets -Complications of uncontrolled diabetes  -Checking blood sugar before meals and bedtime and bring log next visit -All medications with mechanism of action and side effects -Hypoglycemia management: rule of 15's, Glucagon Emergency Kit and medical alert ID -low-carb low-fat plate-method diet -At least 20 minutes of physical activity per day -Annual dilated retinal eye exam and foot exam -compliance and follow  up needs -follow up as scheduled or earlier if problem gets worse  Call if blood sugar is less than 70 or consistently above 250    Take a 15 gm snack of carbohydrate at bedtime before you go to sleep if your blood sugar is less than 100.    If you are going to fast after midnight for a test or procedure, ask your physician for instructions on how to reduce/decrease your insulin dose.    Call if blood sugar is less than 70 or consistently above 250  -Treating a low sugar by rule of 15  (15 gms of sugar every 15 min until sugar is more than 70) If you feel your sugar is low, test your sugar to be sure If your sugar is low (less than 70), then take 15 grams of a fast acting Carbohydrate (3-4 glucose tablets or glucose gel or 4 ounces of juice or regular soda) Recheck your sugar 15 min after treating low to make sure it is more than 70 If sugar is still less than 70, treat again with 15 grams of carbohydrate          Don't drive the hour of hypoglycemia  If unconscious/unable to eat or drink by mouth, use glucagon injection or nasal spray baqsimi and call 911. Can repeat again in 15 min if still unconscious.  Return in about 3 months (around 12/03/2023).   I have reviewed current medications, nurse's notes, allergies, vital signs, past medical and surgical history, family medical history, and social history for this encounter. Counseled patient on symptoms, examination findings, lab findings, imaging results, treatment decisions and monitoring and prognosis. The patient understood the recommendations and agrees with the treatment  plan. All questions regarding treatment plan were fully answered.  [place], MD  09/04/23    History of Present Illness Maliq Inclan is a 42 y.o. year old male who presents for follow up of Type 1 diabetes mellitus and hypothyroidism.  C/o dizzy spell, no palpitations/fractures/osteoporosis No fatigue/hyperdefecation/heat intolerance  No  dysphagia/dysphonia/dyspnea  04/21/23 TSH 0.28   Yassine Center was first diagnosed of diabetes in 1999 at 26.   Diabetes education +  Home diabetes regimen:   Recent history:     INSULIN pump settings with HUMALOG U-200 insulin: BASAL rate 0.6 at midnight and 0.7 at 6 AM BOLUS settings: Carb ratio 1:4 until 5 pm, then 1:3, SENSITIVITY = 1: 80, target 120 12 AM to 6 AM and then 110 Active insulin 5 hours Correction threshold 130, target 120 at midnight and 110 at 6 AM  Non-insulin hypoglycemic drugs: Mounjaro 5 mg weekly  Prior history:   He was reportedly started on insulin 2002 He has been taking Lantus insulin previously and subsequently [place] Also in 2022 he was given Trulicity  PREVIOUS INSULIN regimen :50 units Tresiba in the morning daily NOVOLOG mealtime doses 12 units + carbohydrate ratio 1:5  COMPLICATIONS -  MI/Stroke -  retinopathy, last eye exam 2023 -  neuropathy -  nephropathy  BLOOD SUGAR DATA       Physical Exam  BP 115/64   Pulse 68   Ht 5' 8.5" (1.74 m)   Wt 214 lb 9.6 oz (97.3 kg)   SpO2 97%   BMI 32.16 kg/m    Constitutional: well developed, well nourished Head: normocephalic, atraumatic Eyes: sclera anicteric, no redness Neck: supple Lungs: normal respiratory effort Neurology: alert and oriented Skin: dry, no appreciable rashes Musculoskeletal: no appreciable defects Psychiatric: normal mood and affect Diabetic Foot Exam - Simple   No data filed      Current Medications Patient's Medications  New Prescriptions   No medications on file  Previous Medications   B-D ULTRAFINE III SHORT PEN 31G X 8 MM MISC    USE 4 TIMES A DAY   CLOBETASOL CREAM (TEMOVATE) 0.05 %    Apply 1 Application topically 2 (two) times daily.   CONTINUOUS BLOOD GLUC TRANSMIT (DEXCOM G6 TRANSMITTER) MISC    USE AS DIRECTED   CONTINUOUS GLUCOSE SENSOR (DEXCOM G6 SENSOR) MISC    3 each by Other route See admin instructions. Apply one sensor to body once  every 10 days; E11.9   DICLOFENAC (VOLTAREN) 75 MG EC TABLET    TAKE 1 TABLET (75 MG TOTAL) BY MOUTH 2 (TWO) TIMES DAILY BETWEEN MEALS AS NEEDED.   FUROSEMIDE (LASIX) 20 MG TABLET    Take 20 mg by mouth daily as needed.   GLUCAGON (GLUCAGON EMERGENCY) 1 MG INJECTION    Inject 1 mg into the muscle once as needed for up to 1 dose.   HUMALOG KWIKPEN 200 UNIT/ML KWIKPEN    INJECT UP TO 100 UNITS VIA INSULIN PUMP DAILY   INSULIN ASPART (NOVOLOG FLEXPEN) 100 UNIT/ML FLEXPEN    Inject 35 Units into the skin 3 (three) times daily with meals.   INSULIN DEGLUDEC (TRESIBA FLEXTOUCH) 100 UNIT/ML FLEXTOUCH PEN    INJECT 50 UNITS INTO THE SKIN DAILY   INSULIN DISPOSABLE PUMP (OMNIPOD 5 DEXG7G6 PODS GEN 5) MISC    CHANGE POD EVERY 3 DAYS AS DIRECTED   INSULIN DISPOSABLE PUMP (OMNIPOD 5 G6 INTRO, GEN 5,) KIT    Change pod every 3 days   KETOCONAZOLE (  NIZORAL) 2 % CREAM    Apply 1 application topically daily.   LEVOTHYROXINE (SYNTHROID) 150 MCG TABLET    Take 1 tablet (150 mcg total) by mouth daily.   PIOGLITAZONE (ACTOS) 15 MG TABLET    Take 15 mg by mouth daily.   ROSUVASTATIN (CRESTOR) 10 MG TABLET    Take 10 mg by mouth at bedtime.   SILDENAFIL (VIAGRA) 100 MG TABLET    TAKE 0.5-1 TABLETS (50-100 MG TOTAL) DAILY AS NEEDED FOR ERECTILE DYSFUNCTION. (6 PER 25 DAYS)   SYRINGE/NEEDLE, DISP, 30G X 1/2" 1 ML MISC    Use to inject insulin into to pump   TIRZEPATIDE (MOUNJARO) 10 MG/0.5ML PEN    Inject 10 mg into the skin once a week.   TRIAMCINOLONE CREAM (KENALOG) 0.1 %    Apply 1 application topically 2 (two) times daily.  Modified Medications   No medications on file  Discontinued Medications   No medications on file    Allergies Allergies  Allergen Reactions   Lisinopril Other (See Comments)   Loratadine Other (See Comments)   Losartan Potassium Other (See Comments)   Shellfish Allergy     Itchy throat    Past Medical History Past Medical History:  Diagnosis Date   Diabetes mellitus without  complication (HCC)    History of cataract    Hypothyroidism    SOBOE (shortness of breath on exertion)    Swelling of both lower extremities    Thyroid disease     Past Surgical History Past Surgical History:  Procedure Laterality Date   CATARACT EXTRACTION, BILATERAL  2021   WISDOM TOOTH EXTRACTION  2015    Family History family history includes Breast cancer (age of onset: 87) in his mother; Cancer in his maternal grandmother and maternal great-grandmother; Colon polyps in his maternal uncle; Pancreatic cancer in an other family member.  Social History Social History   Socioeconomic History   Marital status: Married    Spouse name: Braydan Finer   Number of children: 2   Years of education: Not on file   Highest education level: Not on file  Occupational History   Occupation: Production manager  Tobacco Use   Smoking status: Never   Smokeless tobacco: Never  Vaping Use   Vaping status: Never Used  Substance and Sexual Activity   Alcohol use: Yes    Comment: Very limited   Drug use: No   Sexual activity: Yes    Birth control/protection: None    Comment: Last encounter: first week of Sept 2024, Recieved oral sex.  Other Topics Concern   Not on file  Social History Narrative   Not on file   Social Drivers of Health   Financial Resource Strain: Not on file  Food Insecurity: Not on file  Transportation Needs: Not on file  Physical Activity: Not on file  Stress: Not on file  Social Connections: Not on file  Intimate Partner Violence: Not on file    Lab Results  Component Value Date   HGBA1C 7.1 (H) 08/31/2023   HGBA1C 6.5 (A) 02/24/2023   HGBA1C 7.7 (H) 11/21/2022   Lab Results  Component Value Date   CHOL 170 08/31/2023   Lab Results  Component Value Date   HDL 49 08/31/2023   Lab Results  Component Value Date   LDLCALC 99 08/31/2023   Lab Results  Component Value Date   TRIG 120 08/31/2023   Lab Results  Component Value Date   CHOLHDL 3.5  08/31/2023  Lab Results  Component Value Date   CREATININE 1.25 11/21/2022   Lab Results  Component Value Date   GFR 70.55 11/21/2022   Lab Results  Component Value Date   MICROALBUR <0.7 11/26/2022      Component Value Date/Time   NA 139 11/21/2022 0908   K 4.2 11/21/2022 0908   CL 103 11/21/2022 0908   CO2 30 11/21/2022 0908   GLUCOSE 122 (H) 11/21/2022 0908   BUN 12 11/21/2022 0908   CREATININE 1.25 11/21/2022 0908   CREATININE 1.00 08/05/2014 1434   CALCIUM 9.3 11/21/2022 0908   PROT 7.1 11/21/2022 0908   ALBUMIN 4.1 11/21/2022 0908   AST 25 11/21/2022 0908   ALT 22 11/21/2022 0908   ALKPHOS 67 11/21/2022 0908   BILITOT 0.5 11/21/2022 0908   GFRNONAA >60 09/18/2022 1615   GFRAA >60 09/22/2018 1505      Latest Ref Rng & Units 11/21/2022    9:08 AM 09/18/2022    4:15 PM 05/20/2022   10:23 AM  BMP  Glucose 70 - 99 mg/dL 147  829  562   BUN 6 - 23 mg/dL 12  13  12    Creatinine 0.40 - 1.50 mg/dL 1.30  8.65  7.84   Sodium 135 - 145 mEq/L 139  137  138   Potassium 3.5 - 5.1 mEq/L 4.2  4.6  4.4   Chloride 96 - 112 mEq/L 103  105  101   CO2 19 - 32 mEq/L 30  27  26    Calcium 8.4 - 10.5 mg/dL 9.3  9.1  9.3        Component Value Date/Time   WBC 12.7 (H) 09/18/2022 1615   RBC 4.72 09/18/2022 1615   HGB 14.2 09/18/2022 1615   HCT 42.0 09/18/2022 1615   PLT 226 09/18/2022 1615   MCV 89.0 09/18/2022 1615   MCV 86.5 08/05/2014 1437   MCH 30.1 09/18/2022 1615   MCHC 33.8 09/18/2022 1615   RDW 12.1 09/18/2022 1615   LYMPHSABS 1.1 09/18/2022 1615   MONOABS 0.7 09/18/2022 1615   EOSABS 0.0 09/18/2022 1615   BASOSABS 0.0 09/18/2022 1615     Parts of this note may have been dictated using voice recognition software. There may be variances in spelling and vocabulary which are unintentional. Not all errors are proofread. Please notify the Thereasa Parkin if any discrepancies are noted or if the meaning of any statement is not clear.  -+

## 2023-09-07 ENCOUNTER — Encounter: Payer: Self-pay | Admitting: Endocrinology

## 2023-09-10 ENCOUNTER — Encounter: Payer: Self-pay | Admitting: Neurology

## 2023-09-10 ENCOUNTER — Ambulatory Visit: Payer: BC Managed Care – PPO | Admitting: Neurology

## 2023-09-10 VITALS — BP 112/72 | HR 76 | Ht 68.0 in | Wt 215.0 lb

## 2023-09-10 DIAGNOSIS — Z9189 Other specified personal risk factors, not elsewhere classified: Secondary | ICD-10-CM

## 2023-09-10 DIAGNOSIS — R42 Dizziness and giddiness: Secondary | ICD-10-CM | POA: Diagnosis not present

## 2023-09-10 DIAGNOSIS — G8929 Other chronic pain: Secondary | ICD-10-CM

## 2023-09-10 DIAGNOSIS — G479 Sleep disorder, unspecified: Secondary | ICD-10-CM | POA: Diagnosis not present

## 2023-09-10 DIAGNOSIS — M545 Low back pain, unspecified: Secondary | ICD-10-CM

## 2023-09-10 NOTE — Progress Notes (Signed)
Subjective:    Patient ID: Andre Brown is a 42 y.o. male.  HPI    Andre Foley, MD, PhD [facility] [address] [address] [place], [place] [address]  Dear Dr. Nehemiah Settle,  I saw your patient, Andre Brown, upon your kind request in my neurologic clinic today for evaluation of his dizziness.  The patient is unaccompanied today.  As you know, Andre Brown is a 42 year old male with an underlying medical history of hypothyroidism, diabetes, shortness of breath on exertion, lower extremity swelling, hyperlipidemia, hypertension, diastolic dysfunction back pain, and obesity who reports intermittent dizzy spells especially when standing up from a bending position.  Symptoms are daily and have been ongoing for months.  Symptoms last for about 30 seconds at a time.  Does not drink caffeine daily.  He denies any spinning sensation or visual symptoms, no recurrent, no sudden onset of one-sided weakness or numbness or tingling or droopy face or slurring of speech.  He tries to hydrate well, estimates that he drinks about 2 bottles of water per day, 16.0 in size each.  He drinks alcohol once a week or less.  He is a non-smoker.  He does not report any snoring.  He has never had a sleep study.  He does not sleep well, he tosses and turns and is restless, he has discomfort at night for which he takes Tylenol as needed. I reviewed your office visit note from 06/22/2023.  He reported back pain at the time and dizziness.  His blood pressure 120/70 with a repeat blood pressure of 110/70.  He reported feeling dizzy if he is in a heated discussion.  He was suspected to have autonomic dysfunction secondary to diabetes.  I also reviewed your office note from 04/21/2023, at which time he reported dizzy spells.  He reported that they would occur while squatting for at least 45 seconds.  He reported feeling woozy for a few seconds.  He had blood work through your office on 04/21/2023 and I  was able to review results, BMP showed elevated blood sugar at 197, TSH was below normal at 0.28, CBC with differential and platelets benign.  He had a recent lumbar spine x-ray on 08/14/2023 and 2 views and I reviewed the results: Impression: Minor facet hypertrophy at L2-L3 and L3-L4.  He had a brain MRI with and without contrast with indication of altered mental status on 09/22/2018 and I reviewed the results: Impression: Normal brain MRI. In addition, I personally and independently reviewed images through the PACS system.  MRI appeared benign. He had a head CT without contrast with indication of headache on 09/22/2018 and I reviewed the results: Impression: Normal head CT. In addition, I personally and independently reviewed images through the PACS system.  He had a benign looking CT scan of the head.  His Past Medical History Is Significant For: Past Medical History:  Diagnosis Date   Diabetes mellitus without complication (HCC)    History of cataract    Hypothyroidism    SOBOE (shortness of breath on exertion)    Swelling of both lower extremities    Thyroid disease     His Past Surgical History Is Significant For: Past Surgical History:  Procedure Laterality Date   CATARACT EXTRACTION, BILATERAL  2021   WISDOM TOOTH EXTRACTION  2015    His Family History Is Significant For: Family History  Problem Relation Age of Onset   Breast cancer Mother 41  triple negative   Cancer Maternal Grandmother        unknown type   Colon polyps Maternal Uncle    Cancer Maternal Great-grandmother        unknown type   Pancreatic cancer Other        maternal great uncle's son   Diabetes Neg Hx    Esophageal cancer Neg Hx    Inflammatory bowel disease Neg Hx    Liver disease Neg Hx    Rectal cancer Neg Hx    Stomach cancer Neg Hx    Colon cancer Neg Hx     His Social History Is Significant For: Social History   Socioeconomic History   Marital status: Married    Spouse name: Andre Brown   Number of children: 2   Years of education: Not on file   Highest education level: Not on file  Occupational History   Occupation: Production manager   Occupation: Animal nutritionist  Tobacco Use   Smoking status: Never   Smokeless tobacco: Never  Vaping Use   Vaping status: Never Used  Substance and Sexual Activity   Alcohol use: Yes    Comment: Very limited   Drug use: No   Sexual activity: Yes    Birth control/protection: None    Comment: Last encounter: first week of Sept 2024, Recieved oral sex.  Other Topics Concern   Not on file  Social History Narrative   Not on file   Social Drivers of Health   Financial Resource Strain: Not on file  Food Insecurity: Not on file  Transportation Needs: Not on file  Physical Activity: Not on file  Stress: Not on file  Social Connections: Not on file    His Allergies Are:  Allergies  Allergen Reactions   Lisinopril Other (See Comments)   Loratadine Other (See Comments)   Losartan Potassium Other (See Comments)   Shellfish Allergy     Itchy throat  :   His Current Medications Are:  Outpatient Encounter Medications as of 09/10/2023  Medication Sig   B-D ULTRAFINE III SHORT PEN 31G X 8 MM MISC USE 4 TIMES A DAY   clobetasol cream (TEMOVATE) 0.05 % Apply 1 Application topically 2 (two) times daily.   Continuous Blood Gluc Transmit (DEXCOM G6 TRANSMITTER) MISC USE AS DIRECTED   Continuous Glucose Sensor (DEXCOM G6 SENSOR) MISC 3 each by Other route See admin instructions. Apply one sensor to body once every 10 days; E11.9   diclofenac (VOLTAREN) 75 MG EC tablet TAKE 1 TABLET (75 MG TOTAL) BY MOUTH 2 (TWO) TIMES DAILY BETWEEN MEALS AS NEEDED.   furosemide (LASIX) 20 MG tablet Take 20 mg by mouth daily as needed.   glucagon (GLUCAGON EMERGENCY) 1 MG injection Inject 1 mg into the muscle once as needed for up to 1 dose.   HUMALOG KWIKPEN 200 UNIT/ML KwikPen INJECT UP TO 100 UNITS VIA INSULIN PUMP DAILY   insulin aspart (NOVOLOG FLEXPEN)  100 UNIT/ML FlexPen Inject 35 Units into the skin 3 (three) times daily with meals.   insulin degludec (TRESIBA FLEXTOUCH) 100 UNIT/ML FlexTouch Pen INJECT 50 UNITS INTO THE SKIN DAILY   Insulin Disposable Pump (OMNIPOD 5 DEXG7G6 PODS GEN 5) MISC CHANGE POD EVERY 3 DAYS AS DIRECTED   Insulin Disposable Pump (OMNIPOD 5 G6 INTRO, GEN 5,) KIT Change pod every 3 days   ketoconazole (NIZORAL) 2 % cream Apply 1 application topically daily.   levothyroxine (SYNTHROID) 150 MCG tablet Take 1 tablet (150 mcg total)  by mouth daily.   rosuvastatin (CRESTOR) 10 MG tablet Take 10 mg by mouth at bedtime.   sildenafil (VIAGRA) 100 MG tablet TAKE 0.5-1 TABLETS (50-100 MG TOTAL) DAILY AS NEEDED FOR ERECTILE DYSFUNCTION. (6 PER 25 DAYS)   Syringe/Needle, Disp, 30G X 1/2" 1 ML MISC Use to inject insulin into to pump   tirzepatide (MOUNJARO) 10 MG/0.5ML Pen Inject 10 mg into the skin once a week. (Patient taking differently: Inject 7.5 mg into the skin once a week. 7.5 mg weekly)   triamcinolone cream (KENALOG) 0.1 % Apply 1 application topically 2 (two) times daily.   pioglitazone (ACTOS) 15 MG tablet Take 15 mg by mouth daily. (Patient not taking: Reported on 09/10/2023)   No facility-administered encounter medications on file as of 09/10/2023.  :   Review of Systems:  Out of a complete 14 point review of systems, all are reviewed and negative with the exception of these symptoms as listed below:   Review of Systems  Neurological:        Pt is here for leg pain. Pt states he is having pain from the of his glutes that radiates down to his legs. States is a throbbing pain that gets better once he stretches.     Objective:  Neurological Exam  Physical Exam Physical Examination:   Vitals:   09/10/23 0825  BP: 112/72  Pulse: 76   No orthostatic hypotension.  No symptomatic dizziness with orthostatic testing.  Lying blood pressure 120/80 and pulse of 68, standing at 3 minutes 127/80 with a pulse of  83.  General Examination: The patient is a very pleasant 42 y.o. male in no acute distress. He appears well-developed and well-nourished and well groomed.   HEENT: Normocephalic, atraumatic, pupils are equal, round and reactive to light, no photophobia.  No vertiginous symptoms.  Extraocular tracking is good without limitation to gaze excursion or nystagmus noted. Hearing is grossly intact. Face is symmetric with normal facial animation. Speech is clear with no dysarthria noted. There is no hypophonia. There is no lip, neck/head, jaw or voice tremor. Neck is supple with full range of passive and active motion. There are no carotid bruits on auscultation. Oropharynx exam reveals: mild mouth dryness, good dental hygiene and moderate airway crowding. Tongue protrudes centrally and palate elevates symmetrically.   Chest: Clear to auscultation without wheezing, rhonchi or crackles noted.  Heart: S1+S2+0, regular and normal without murmurs, rubs or gallops noted.   Abdomen: Soft, non-tender and non-distended.  Extremities: There is no pitting edema in the distal lower extremities bilaterally.   Skin: Warm and dry without trophic changes noted.   Musculoskeletal: exam reveals no obvious joint deformities.   Neurologically:  Mental status: The patient is awake, alert and oriented in all 4 spheres. His immediate and remote memory, attention, language skills and fund of knowledge are appropriate. There is no evidence of aphasia, agnosia, apraxia or anomia. Speech is clear with normal prosody and enunciation. Thought process is linear. Mood is normal and affect is normal.  Cranial nerves II - XII are as described above under HEENT exam.  Motor exam: Normal bulk, strength and tone is noted. There is no obvious action or resting tremor.  No drift or rebound. Reflexes 1+ throughout, toes are downgoing.  Romberg negative. Fine motor skills and coordination: intact finger taps, hand movements and rapid  alternating patting in both upper extremities, normal foot taps bilaterally in the lower extremities.  Cerebellar testing: No dysmetria or intention tremor. There is  no truncal or gait ataxia.  Sensory exam: intact to light touch in the upper and lower extremities.  Gait, station and balance: He stands easily. No veering to one side is noted. No leaning to one side is noted. Posture is age-appropriate and stance is narrow based. Gait shows normal stride length and normal pace. No problems turning are noted.  Normal tandem walk.  Assessment and Plan:   In summary, Andre Brown is a very pleasant 42 y.o.-year old male with an underlying medical history of hypothyroidism, diabetes, shortness of breath on exertion, lower extremity swelling, hyperlipidemia, hypertension, diastolic dysfunction back pain, and obesity, who presents for evaluation of his dizzy spells of several months duration.  Neurological exam is nonfocal.  He has no orthostatic hypotension or vertiginous spells, no red flags on history.  Nevertheless, I had a long discussion with the patient regarding his symptoms and this was an extended visit of over 60 minutes with copious record review involved, addressing multiple issues and considerable counseling and coordination of care.  Below is a summary of our discussion points today and my recommendations for him.  He was given instructions verbally and also in writing in his MyChart after visit summary which he can access electronically.  At this time, he declines an MRI of the brain and sleep testing.  I would be happy to see him back as needed.  He is advised to follow up with you closely. << Try to hydrate well with water, about 4 bottles of water per day, 16.9 oz each. Try compression socks (over the counter, knee-highs) to help with positional lightheadedness. I would be happy to order a brain MRI to rule out a structural cause of your symptoms.  I recommend we do a home sleep test to  evaluate you for possible obstructive sleep apnea. Let me know, if you change your mind.  Please follow up with Dr. Nehemiah Settle on your lumbar spine X ray and leg pain; you may benefit from seeing a spine specialist, such as through orthopedics or sports medicine.  If you noticed dizziness after weight loss, make sure your primary care has ruled out vitamin deficiencies and thyroid dysfunction.  I would be happy to see you back as needed. >> Thank you very much for allowing me to participate in the care of this nice patient. If I can be of any further assistance to you please do not hesitate to call me at [phone].  Sincerely,   Andre Foley, MD, PhD

## 2023-09-10 NOTE — Patient Instructions (Addendum)
It was nice to meet you today.  Thankfully, your neurological exam is non-focal.  Unfortunately, dizziness is a very common complaint but is often not due to a primary neurological reason or single underlying medical problem. Often, there a combination of factors, that result in dizziness. This includes blood pressure fluctuations, medication side effects, blood sugar fluctuations, stress, vertigo, poor sleep with sleep deprivation, dehydration, and electrolyte disturbance or other metabolic and endocrinological reasons, meaning hormone related problems such as thyroid dysfunction. Here are my recommendations for you and a summary of our discussion:   Try to hydrate well with water, about 4 bottles of water per day, 16.9 oz each. Try compression socks (over the counter, knee-highs) to help with positional lightheadedness. I would be happy to order a brain MRI to rule out a structural cause of your symptoms.  I recommend we do a home sleep test to evaluate you for possible obstructive sleep apnea. Let me know, if you change your mind.  Please follow up with Dr. Nehemiah Settle on your lumbar spine X ray and leg pain; you may benefit from seeing a spine specialist, such as through orthopedics or sports medicine.  If you noticed dizziness after weight loss, make sure your primary care has ruled out vitamin deficiencies and thyroid dysfunction.  I would be happy to see you back as needed.

## 2023-09-11 DIAGNOSIS — M7071 Other bursitis of hip, right hip: Secondary | ICD-10-CM | POA: Diagnosis not present

## 2023-09-11 DIAGNOSIS — M791 Myalgia, unspecified site: Secondary | ICD-10-CM | POA: Diagnosis not present

## 2023-09-11 DIAGNOSIS — M7072 Other bursitis of hip, left hip: Secondary | ICD-10-CM | POA: Diagnosis not present

## 2023-09-22 ENCOUNTER — Encounter: Payer: Self-pay | Admitting: "Endocrinology

## 2023-10-13 ENCOUNTER — Ambulatory Visit: Payer: BC Managed Care – PPO | Admitting: Neurology

## 2023-10-21 ENCOUNTER — Other Ambulatory Visit: Payer: Self-pay | Admitting: "Endocrinology

## 2023-11-02 ENCOUNTER — Other Ambulatory Visit: Payer: Self-pay

## 2023-11-02 ENCOUNTER — Encounter: Payer: Self-pay | Admitting: "Endocrinology

## 2023-11-02 MED ORDER — TIRZEPATIDE 10 MG/0.5ML ~~LOC~~ SOAJ: 10.0000 mg | SUBCUTANEOUS | 1 refills | Status: DC

## 2023-11-06 ENCOUNTER — Other Ambulatory Visit: Payer: Self-pay | Admitting: "Endocrinology

## 2023-12-02 ENCOUNTER — Other Ambulatory Visit: Payer: Self-pay

## 2023-12-02 ENCOUNTER — Telehealth: Payer: Self-pay

## 2023-12-02 DIAGNOSIS — E1065 Type 1 diabetes mellitus with hyperglycemia: Secondary | ICD-10-CM

## 2023-12-02 MED ORDER — DEXCOM G6 TRANSMITTER MISC: 3 refills | Status: DC

## 2023-12-02 NOTE — Telephone Encounter (Signed)
 Requested Prescriptions   Pending Prescriptions Disp Refills   Continuous Glucose Transmitter (DEXCOM G6 TRANSMITTER) MISC 1 each 3    Sig: USE AS DIRECTED

## 2023-12-03 ENCOUNTER — Ambulatory Visit (INDEPENDENT_AMBULATORY_CARE_PROVIDER_SITE_OTHER): Payer: BC Managed Care – PPO | Admitting: "Endocrinology

## 2023-12-03 ENCOUNTER — Encounter: Payer: Self-pay | Admitting: "Endocrinology

## 2023-12-03 VITALS — BP 106/80 | HR 67 | Ht 68.0 in | Wt 220.0 lb

## 2023-12-03 DIAGNOSIS — E1065 Type 1 diabetes mellitus with hyperglycemia: Secondary | ICD-10-CM

## 2023-12-03 DIAGNOSIS — Z9641 Presence of insulin pump (external) (internal): Secondary | ICD-10-CM | POA: Diagnosis not present

## 2023-12-03 DIAGNOSIS — E039 Hypothyroidism, unspecified: Secondary | ICD-10-CM

## 2023-12-03 DIAGNOSIS — Z7985 Long-term (current) use of injectable non-insulin antidiabetic drugs: Secondary | ICD-10-CM | POA: Diagnosis not present

## 2023-12-03 LAB — POCT GLYCOSYLATED HEMOGLOBIN (HGB A1C): Hemoglobin A1C: 6.8 % — AB (ref 4.0–5.6)

## 2023-12-03 MED ORDER — TIRZEPATIDE 12.5 MG/0.5ML ~~LOC~~ SOAJ: 12.5000 mg | SUBCUTANEOUS | 0 refills | Status: DC

## 2023-12-03 NOTE — Progress Notes (Signed)
 Outpatient Endocrinology Note [place], MD  12/03/23   Andre Brown 1981-04-05 [id number]  Referring Provider: Renford Dills, MD Primary Care Provider: Renford Dills, MD Reason for consultation: Subjective   Assessment & Plan  Diagnoses and all orders for this visit:  Type 1 diabetes mellitus with hyperglycemia (HCC) -     POCT glycosylated hemoglobin (Hb A1C)  Insulin pump in place  Long-term (current) use of injectable non-insulin antidiabetic drugs  Acquired hypothyroidism  Other orders -     tirzepatide (MOUNJARO) 12.5 MG/0.5ML Pen; Inject 12.5 mg into the skin once a week.   Diabetes complicated by hypoglycemia Hba1c goal less than 7.0, current Hba1c is . Will recommend the following: Continue INSULIN pump settings with HUMALOG U-200 insulin: BASAL rate 0.6 at midnight and 0.7 at 6 AM BOLUS settings: Carb ratio 1:3 (1:2 from 5pm-12am) , Sensitivity = 1: 80, target 120 12 AM to 6 AM and then 110 Active insulin 5 hours  Patient undershoots carbs to prevent hypoglycemia (not accurante inc arb counting, will need DM education if BG continued to be irregular) Mounjaro 12.5 mg weekly - for weight loss. No S/E  Has [name] as back up   No known contraindications to any of above medications Glucagon - pt has it  Hyperlipidemia -Last LDL near goal: 99 (repeat in 3 mo) -on rosuvastatin 10 mg every day, not interested in dose increment  -Follow low fat diet and exercise   -Blood pressure goal <140/90 - Microalbumin/creatinine at goal < 30 -not on ACE/ARB  -diet changes including salt restriction -limit eating outside -counseled BP targets per standards of diabetes care -Uncontrolled blood pressure can lead to retinopathy, nephropathy and cardiovascular and atherosclerotic heart disease  Reviewed and counseled on: -A1C target -Blood sugar targets -Complications of uncontrolled diabetes  -Checking blood sugar before meals and bedtime and bring log  next visit -All medications with mechanism of action and side effects -Hypoglycemia management: rule of 15's, Glucagon Emergency Kit and medical alert ID -low-carb low-fat plate-method diet -At least 20 minutes of physical activity per day -Annual dilated retinal eye exam and foot exam -compliance and follow up needs -follow up as scheduled or earlier if problem gets worse  Call if blood sugar is less than 70 or consistently above 250    Take a 15 gm snack of carbohydrate at bedtime before you go to sleep if your blood sugar is less than 100.    If you are going to fast after midnight for a test or procedure, ask your physician for instructions on how to reduce/decrease your insulin dose.    Call if blood sugar is less than 70 or consistently above 250  -Treating a low sugar by rule of 15  (15 gms of sugar every 15 min until sugar is more than 70) If you feel your sugar is low, test your sugar to be sure If your sugar is low (less than 70), then take 15 grams of a fast acting Carbohydrate (3-4 glucose tablets or glucose gel or 4 ounces of juice or regular soda) Recheck your sugar 15 min after treating low to make sure it is more than 70 If sugar is still less than 70, treat again with 15 grams of carbohydrate          Don't drive the hour of hypoglycemia  If unconscious/unable to eat or drink by mouth, use glucagon injection or nasal spray baqsimi and call 911. Can repeat again in 15 min if still unconscious.  Return in about 4 weeks (around 12/31/2023).   I have reviewed current medications, nurse's notes, allergies, vital signs, past medical and surgical history, family medical history, and social history for this encounter. Counseled patient on symptoms, examination findings, lab findings, imaging results, treatment decisions and monitoring and prognosis. The patient understood the recommendations and agrees with the treatment plan. All questions regarding treatment plan were fully  answered.  [place], MD  12/03/23    History of Present Illness Andre Brown is a 43 y.o. year old male who presents for follow up of Type 1 diabetes mellitus and hypothyroidism.  Hypothyroidism managed by PCP  Andre Brown was first diagnosed of diabetes in 1999 at 3.   Diabetes education +  Home diabetes regimen:   Recent history:     INSULIN pump settings with HUMALOG U-200 insulin: BASAL rate 0.6 at midnight and 0.7 at 6 AM BOLUS settings: Carb ratio 1:4 until 5 pm, then 1:3, SENSITIVITY = 1: 80, target 120 12 AM to 6 AM and then 110 Active insulin 5 hours Correction threshold 130, target 120 at midnight and 110 at 6 AM  Non-insulin hypoglycemic drugs: Mounjaro 5 mg weekly  Prior history:   He was reportedly started on insulin 2002 He has been taking Lantus insulin previously and subsequently [place] Also in 2022 he was given Trulicity  PREVIOUS INSULIN regimen :50 units Tresiba in the morning daily NOVOLOG mealtime doses 12 units + carbohydrate ratio 1:5  COMPLICATIONS -  MI/Stroke -  retinopathy, last eye exam 2023 -  neuropathy -  nephropathy  BLOOD SUGAR DATA  CGM interpretation: At today's visit, we reviewed her CGM downloads. The full report is scanned in the media. Reviewing the CGM trends, BG are  elevated after dinner.  Physical Exam  BP 106/80   Pulse 67   Ht 5\' 8"  (1.727 m)   Wt 220 lb (99.8 kg)   SpO2 98%   BMI 33.45 kg/m    Constitutional: well developed, well nourished Head: normocephalic, atraumatic Eyes: sclera anicteric, no redness Neck: supple Lungs: normal respiratory effort Neurology: alert and oriented Skin: dry, no appreciable rashes Musculoskeletal: no appreciable defects Psychiatric: normal mood and affect Diabetic Foot Exam - Simple   No data filed      Current Medications Patient's Medications  New Prescriptions   TIRZEPATIDE (MOUNJARO) 12.5 MG/0.5ML PEN    Inject 12.5 mg into the skin once a week.   Previous Medications   B-D ULTRAFINE III SHORT PEN 31G X 8 MM MISC    USE 4 TIMES A DAY   CLOBETASOL CREAM (TEMOVATE) 0.05 %    Apply 1 Application topically 2 (two) times daily.   CONTINUOUS GLUCOSE SENSOR (DEXCOM G6 SENSOR) MISC    3 each by Other route See admin instructions. Apply one sensor to body once every 10 days; E11.9   CONTINUOUS GLUCOSE TRANSMITTER (DEXCOM G6 TRANSMITTER) MISC    USE AS DIRECTED   DICLOFENAC (VOLTAREN) 75 MG EC TABLET    TAKE 1 TABLET (75 MG TOTAL) BY MOUTH 2 (TWO) TIMES DAILY BETWEEN MEALS AS NEEDED.   FUROSEMIDE (LASIX) 20 MG TABLET    Take 20 mg by mouth daily as needed.   GLUCAGON (GLUCAGON EMERGENCY) 1 MG INJECTION    Inject 1 mg into the muscle once as needed for up to 1 dose.   HUMALOG KWIKPEN 200 UNIT/ML KWIKPEN    INJECT UP TO 100 UNITS VIA INSULIN PUMP DAILY   INSULIN ASPART (NOVOLOG FLEXPEN) 100 UNIT/ML  FLEXPEN    Inject 35 Units into the skin 3 (three) times daily with meals.   INSULIN DEGLUDEC (TRESIBA FLEXTOUCH) 100 UNIT/ML FLEXTOUCH PEN    INJECT 50 UNITS INTO THE SKIN DAILY   INSULIN DISPOSABLE PUMP (OMNIPOD 5 DEXG7G6 PODS GEN 5) MISC    CHANGE POD EVERY 3 DAYS AS DIRECTED   INSULIN DISPOSABLE PUMP (OMNIPOD 5 G6 INTRO, GEN 5,) KIT    Change pod every 3 days   KETOCONAZOLE (NIZORAL) 2 % CREAM    Apply 1 application topically daily.   LEVOTHYROXINE (SYNTHROID) 150 MCG TABLET    TAKE 1 TABLET BY MOUTH EVERY DAY   ROSUVASTATIN (CRESTOR) 10 MG TABLET    Take 10 mg by mouth at bedtime.   SILDENAFIL (VIAGRA) 100 MG TABLET    TAKE 0.5-1 TABLETS (50-100 MG TOTAL) DAILY AS NEEDED FOR ERECTILE DYSFUNCTION. (6 PER 25 DAYS)   SYRINGE/NEEDLE, DISP, 30G X 1/2" 1 ML MISC    Use to inject insulin into to pump   TRIAMCINOLONE CREAM (KENALOG) 0.1 %    Apply 1 application topically 2 (two) times daily.  Modified Medications   No medications on file  Discontinued Medications   PIOGLITAZONE (ACTOS) 15 MG TABLET    Take 15 mg by mouth daily.   TIRZEPATIDE (MOUNJARO) 10  MG/0.5ML PEN    Inject 10 mg into the skin once a week.    Allergies Allergies  Allergen Reactions   Lisinopril Other (See Comments)   Loratadine Other (See Comments)   Losartan Potassium Other (See Comments)   Shellfish Allergy     Itchy throat    Past Medical History Past Medical History:  Diagnosis Date   Diabetes mellitus without complication (HCC)    History of cataract    Hypothyroidism    SOBOE (shortness of breath on exertion)    Swelling of both lower extremities    Thyroid disease     Past Surgical History Past Surgical History:  Procedure Laterality Date   CATARACT EXTRACTION, BILATERAL  2021   WISDOM TOOTH EXTRACTION  2015    Family History family history includes Breast cancer (age of onset: 63) in his mother; Cancer in his maternal grandmother and maternal great-grandmother; Colon polyps in his maternal uncle; Pancreatic cancer in an other family member.  Social History Social History   Socioeconomic History   Marital status: Married    Spouse name: Davan Nawabi   Number of children: 2   Years of education: Not on file   Highest education level: Not on file  Occupational History   Occupation: Production manager   Occupation: Animal nutritionist  Tobacco Use   Smoking status: Never   Smokeless tobacco: Never  Vaping Use   Vaping status: Never Used  Substance and Sexual Activity   Alcohol use: Yes    Comment: Very limited   Drug use: No   Sexual activity: Yes    Birth control/protection: None    Comment: Last encounter: first week of Sept 2024, Recieved oral sex.  Other Topics Concern   Not on file  Social History Narrative   Not on file   Social Drivers of Health   Financial Resource Strain: Not on file  Food Insecurity: Not on file  Transportation Needs: Not on file  Physical Activity: Not on file  Stress: Not on file  Social Connections: Not on file  Intimate Partner Violence: Not on file    Lab Results  Component Value Date   HGBA1C 6.8  (A) 12/03/2023  HGBA1C 7.1 (H) 08/31/2023   HGBA1C 6.5 (A) 02/24/2023   Lab Results  Component Value Date   CHOL 170 08/31/2023   Lab Results  Component Value Date   HDL 49 08/31/2023   Lab Results  Component Value Date   LDLCALC 99 08/31/2023   Lab Results  Component Value Date   TRIG 120 08/31/2023   Lab Results  Component Value Date   CHOLHDL 3.5 08/31/2023   Lab Results  Component Value Date   CREATININE 1.25 11/21/2022   Lab Results  Component Value Date   GFR 70.55 11/21/2022   Lab Results  Component Value Date   MICROALBUR <0.7 11/26/2022      Component Value Date/Time   NA 139 11/21/2022 0908   K 4.2 11/21/2022 0908   CL 103 11/21/2022 0908   CO2 30 11/21/2022 0908   GLUCOSE 122 (H) 11/21/2022 0908   BUN 12 11/21/2022 0908   CREATININE 1.25 11/21/2022 0908   CREATININE 1.00 08/05/2014 1434   CALCIUM 9.3 11/21/2022 0908   PROT 7.1 11/21/2022 0908   ALBUMIN 4.1 11/21/2022 0908   AST 25 11/21/2022 0908   ALT 22 11/21/2022 0908   ALKPHOS 67 11/21/2022 0908   BILITOT 0.5 11/21/2022 0908   GFRNONAA >60 09/18/2022 1615   GFRAA >60 09/22/2018 1505      Latest Ref Rng & Units 11/21/2022    9:08 AM 09/18/2022    4:15 PM 05/20/2022   10:23 AM  BMP  Glucose 70 - 99 mg/dL 161  096  045   BUN 6 - 23 mg/dL 12  13  12    Creatinine 0.40 - 1.50 mg/dL 4.09  8.11  9.14   Sodium 135 - 145 mEq/L 139  137  138   Potassium 3.5 - 5.1 mEq/L 4.2  4.6  4.4   Chloride 96 - 112 mEq/L 103  105  101   CO2 19 - 32 mEq/L 30  27  26    Calcium 8.4 - 10.5 mg/dL 9.3  9.1  9.3        Component Value Date/Time   WBC 12.7 (H) 09/18/2022 1615   RBC 4.72 09/18/2022 1615   HGB 14.2 09/18/2022 1615   HCT 42.0 09/18/2022 1615   PLT 226 09/18/2022 1615   MCV 89.0 09/18/2022 1615   MCV 86.5 08/05/2014 1437   MCH 30.1 09/18/2022 1615   MCHC 33.8 09/18/2022 1615   RDW 12.1 09/18/2022 1615   LYMPHSABS 1.1 09/18/2022 1615   MONOABS 0.7 09/18/2022 1615   EOSABS 0.0 09/18/2022  1615   BASOSABS 0.0 09/18/2022 1615     Parts of this note may have been dictated using voice recognition software. There may be variances in spelling and vocabulary which are unintentional. Not all errors are proofread. Please notify the [facility] if any discrepancies are noted or if the meaning of any statement is not clear.  -+

## 2023-12-03 NOTE — Patient Instructions (Signed)

## 2023-12-07 ENCOUNTER — Other Ambulatory Visit: Payer: Self-pay

## 2023-12-07 MED ORDER — TRESIBA FLEXTOUCH 100 UNIT/ML ~~LOC~~ SOPN
50.0000 [IU] | PEN_INJECTOR | Freq: Every day | SUBCUTANEOUS | 3 refills | Status: AC
Start: 2023-12-07 — End: ?

## 2023-12-16 ENCOUNTER — Encounter: Payer: Self-pay | Admitting: "Endocrinology

## 2024-01-14 ENCOUNTER — Ambulatory Visit (INDEPENDENT_AMBULATORY_CARE_PROVIDER_SITE_OTHER): Admitting: "Endocrinology

## 2024-01-14 ENCOUNTER — Encounter: Payer: Self-pay | Admitting: "Endocrinology

## 2024-01-14 VITALS — BP 122/70 | HR 80 | Ht 68.0 in | Wt 218.0 lb

## 2024-01-14 DIAGNOSIS — E7800 Pure hypercholesterolemia, unspecified: Secondary | ICD-10-CM | POA: Diagnosis not present

## 2024-01-14 DIAGNOSIS — E1065 Type 1 diabetes mellitus with hyperglycemia: Secondary | ICD-10-CM | POA: Diagnosis not present

## 2024-01-14 DIAGNOSIS — Z9641 Presence of insulin pump (external) (internal): Secondary | ICD-10-CM | POA: Diagnosis not present

## 2024-01-14 DIAGNOSIS — Z7985 Long-term (current) use of injectable non-insulin antidiabetic drugs: Secondary | ICD-10-CM

## 2024-01-14 NOTE — Patient Instructions (Signed)

## 2024-01-14 NOTE — Progress Notes (Signed)
 Outpatient Endocrinology Note Jorge Newcomer, MD  01/14/24   Andre Brown 1981/05/16 [id number]  Referring Provider: Merl Star, MD Primary Care Provider: Merl Star, MD Reason for consultation: Subjective   Assessment & Plan  Diagnoses and all orders for this visit:  Type 1 diabetes mellitus with hyperglycemia (HCC) -     Ambulatory referral to diabetic education -     Lipid panel -     Microalbumin / creatinine urine ratio  Insulin  pump in place  Long-term (current) use of injectable non-insulin  antidiabetic drugs  Pure hypercholesterolemia -     Lipid panel -     Microalbumin / creatinine urine ratio   Diabetes complicated by hypoglycemia Hba1c goal less than 7.0, current Hba1c is 6.8. Will recommend the following: Continue INSULIN  pump settings with HUMALOG  U-200 insulin : BASAL rate 0.6 at midnight and 0.7 at 6 AM BOLUS settings: Carb ratio 1:3 (1:2 from 5pm-12am) , Sensitivity = 1: 80, target 120 12 AM to 6 AM and then 110 Active insulin  5 hours  Patient undershoots carbs to prevent hypoglycemia (not accurante in carb counting, will need DM education if BG continued to be irregular) Mounjaro  12.5 mg weekly - for weight loss. No S/E  Has Tresiba  as back up   No known contraindications to any of above medications Glucagon  - pt has it  Hyperlipidemia -Last LDL near goal: 99 (repeat in 3 mo) -on rosuvastatin 10 mg every day, not interested in dose increment, reinforced compliance  -Follow low fat diet and exercise   -Blood pressure goal <140/90 - Microalbumin/creatinine at goal < 30 -not on ACE/ARB  -diet changes including salt restriction -limit eating outside -counseled BP targets per standards of diabetes care -Uncontrolled blood pressure can lead to retinopathy, nephropathy and cardiovascular and atherosclerotic heart disease  Reviewed and counseled on: -A1C target -Blood sugar targets -Complications of uncontrolled diabetes   -Checking blood sugar before meals and bedtime and bring log next visit -All medications with mechanism of action and side effects -Hypoglycemia management: rule of 15's, Glucagon  Emergency Kit and medical alert ID -low-carb low-fat plate-method diet -At least 20 minutes of physical activity per day -Annual dilated retinal eye exam and foot exam -compliance and follow up needs -follow up as scheduled or earlier if problem gets worse  Call if blood sugar is less than 70 or consistently above 250    Take a 15 gm snack of carbohydrate at bedtime before you go to sleep if your blood sugar is less than 100.    If you are going to fast after midnight for a test or procedure, ask your physician for instructions on how to reduce/decrease your insulin  dose.    Call if blood sugar is less than 70 or consistently above 250  -Treating a low sugar by rule of 15  (15 gms of sugar every 15 min until sugar is more than 70) If you feel your sugar is low, test your sugar to be sure If your sugar is low (less than 70), then take 15 grams of a fast acting Carbohydrate (3-4 glucose tablets or glucose gel or 4 ounces of juice or regular soda) Recheck your sugar 15 min after treating low to make sure it is more than 70 If sugar is still less than 70, treat again with 15 grams of carbohydrate          Don't drive the hour of hypoglycemia  If unconscious/unable to eat or drink by mouth, use glucagon  injection or nasal  spray baqsimi  and call 911. Can repeat again in 15 min if still unconscious.  Return in about 3 months (around 04/14/2024) for visit and 8 am labs before next visit.   I have reviewed current medications, nurse's notes, allergies, vital signs, past medical and surgical history, family medical history, and social history for this encounter. Counseled patient on symptoms, examination findings, lab findings, imaging results, treatment decisions and monitoring and prognosis. The patient understood the  recommendations and agrees with the treatment plan. All questions regarding treatment plan were fully answered.  Jorge Newcomer, MD  01/14/24   History of Present Illness Dailan Pfalzgraf is a 43 y.o. year old male who presents for follow up of Type 1 diabetes mellitus.  Hypothyroidism managed by PCP  Andre Brown was first diagnosed of diabetes in 1999 at 69.   Diabetes education +  Home diabetes regimen:  Recent history:     INSULIN  pump settings with HUMALOG  U-200 insulin : BASAL rate 0.6 at midnight and 0.7 at 6 AM BOLUS settings: Carb ratio 1:4 until 5 pm, then 1:3, SENSITIVITY = 1: 80, target 120 12 AM to 6 AM and then 110 Active insulin  5 hours Correction threshold 130, target 120 at midnight and 110 at 6 AM  Non-insulin  hypoglycemic drugs: Mounjaro  5 mg weekly  Prior history:   He was reportedly started on insulin  2002 He has been taking Lantus insulin  previously and subsequently Tresiba  Also in 2022 he was given Trulicity   PREVIOUS INSULIN  regimen :50 units Tresiba  in the morning daily NOVOLOG  mealtime doses 12 units + carbohydrate ratio 1:5  COMPLICATIONS -  MI/Stroke -  retinopathy, last eye exam 2023 -  neuropathy -  nephropathy  BLOOD SUGAR DATA  CGM interpretation: At today's visit, we reviewed her CGM downloads. The full report is scanned in the media. Reviewing the CGM trends, BG are  elevated after dinner.  Physical Exam  BP 122/70   Pulse 80   Ht 5\' 8"  (1.727 m)   Wt 218 lb (98.9 kg)   SpO2 98%   BMI 33.15 kg/m    Constitutional: well developed, well nourished Head: normocephalic, atraumatic Eyes: sclera anicteric, no redness Neck: supple Lungs: normal respiratory effort Neurology: alert and oriented Skin: dry, no appreciable rashes Musculoskeletal: no appreciable defects Psychiatric: normal mood and affect Diabetic Foot Exam - Simple   No data filed      Current Medications Patient's Medications  New Prescriptions   No  medications on file  Previous Medications   B-D ULTRAFINE III SHORT PEN 31G X 8 MM MISC    USE 4 TIMES A DAY   CLOBETASOL CREAM (TEMOVATE) 0.05 %    Apply 1 Application topically 2 (two) times daily.   CONTINUOUS GLUCOSE SENSOR (DEXCOM G6 SENSOR) MISC    3 each by Other route See admin instructions. Apply one sensor to body once every 10 days; E11.9   CONTINUOUS GLUCOSE TRANSMITTER (DEXCOM G6 TRANSMITTER) MISC    USE AS DIRECTED   DICLOFENAC  (VOLTAREN ) 75 MG EC TABLET    TAKE 1 TABLET (75 MG TOTAL) BY MOUTH 2 (TWO) TIMES DAILY BETWEEN MEALS AS NEEDED.   FUROSEMIDE (LASIX) 20 MG TABLET    Take 20 mg by mouth daily as needed.   GLUCAGON  (GLUCAGON  EMERGENCY) 1 MG INJECTION    Inject 1 mg into the muscle once as needed for up to 1 dose.   HUMALOG  KWIKPEN 200 UNIT/ML KWIKPEN    INJECT UP TO 100 UNITS VIA INSULIN  PUMP DAILY  INSULIN  ASPART (NOVOLOG  FLEXPEN) 100 UNIT/ML FLEXPEN    Inject 35 Units into the skin 3 (three) times daily with meals.   INSULIN  DEGLUDEC (TRESIBA  FLEXTOUCH) 100 UNIT/ML FLEXTOUCH PEN    Inject 50 Units into the skin daily.   INSULIN  DISPOSABLE PUMP (OMNIPOD 5 DEXG7G6 PODS GEN 5) MISC    CHANGE POD EVERY 3 DAYS AS DIRECTED   INSULIN  DISPOSABLE PUMP (OMNIPOD 5 G6 INTRO, GEN 5,) KIT    Change pod every 3 days   KETOCONAZOLE  (NIZORAL ) 2 % CREAM    Apply 1 application topically daily.   LEVOTHYROXINE  (SYNTHROID ) 150 MCG TABLET    TAKE 1 TABLET BY MOUTH EVERY DAY   ROSUVASTATIN (CRESTOR) 10 MG TABLET    Take 10 mg by mouth at bedtime.   SILDENAFIL  (VIAGRA ) 100 MG TABLET    TAKE 0.5-1 TABLETS (50-100 MG TOTAL) DAILY AS NEEDED FOR ERECTILE DYSFUNCTION. (6 PER 25 DAYS)   SYRINGE/NEEDLE, DISP, 30G X 1/2" 1 ML MISC    Use to inject insulin  into to pump   TIRZEPATIDE  (MOUNJARO ) 12.5 MG/0.5ML PEN    Inject 12.5 mg into the skin once a week.   TRIAMCINOLONE  CREAM (KENALOG ) 0.1 %    Apply 1 application topically 2 (two) times daily.  Modified Medications   No medications on file   Discontinued Medications   No medications on file    Allergies Allergies  Allergen Reactions   Lisinopril Other (See Comments)   Loratadine Other (See Comments)   Losartan Potassium Other (See Comments)   Shellfish Allergy     Itchy throat    Past Medical History Past Medical History:  Diagnosis Date   Diabetes mellitus without complication (HCC)    History of cataract    Hypothyroidism    SOBOE (shortness of breath on exertion)    Swelling of both lower extremities    Thyroid  disease     Past Surgical History Past Surgical History:  Procedure Laterality Date   CATARACT EXTRACTION, BILATERAL  2021   WISDOM TOOTH EXTRACTION  2015    Family History family history includes Breast cancer (age of onset: 86) in his mother; Cancer in his maternal grandmother and maternal great-grandmother; Colon polyps in his maternal uncle; Pancreatic cancer in an other family member.  Social History Social History   Socioeconomic History   Marital status: Married    Spouse name: Sha Amer   Number of children: 2   Years of education: Not on file   Highest education level: Not on file  Occupational History   Occupation: Production manager   Occupation: Animal nutritionist  Tobacco Use   Smoking status: Never   Smokeless tobacco: Never  Vaping Use   Vaping status: Never Used  Substance and Sexual Activity   Alcohol use: Yes    Comment: Very limited   Drug use: No   Sexual activity: Yes    Birth control/protection: None    Comment: Last encounter: first week of Sept 2024, Recieved oral sex.  Other Topics Concern   Not on file  Social History Narrative   Not on file   Social Drivers of Health   Financial Resource Strain: Not on file  Food Insecurity: Not on file  Transportation Needs: Not on file  Physical Activity: Not on file  Stress: Not on file  Social Connections: Not on file  Intimate Partner Violence: Not on file    Lab Results  Component Value Date   HGBA1C 6.8 (A)  12/03/2023   HGBA1C 7.1 (  H) 08/31/2023   HGBA1C 6.5 (A) 02/24/2023   Lab Results  Component Value Date   CHOL 170 08/31/2023   Lab Results  Component Value Date   HDL 49 08/31/2023   Lab Results  Component Value Date   LDLCALC 99 08/31/2023   Lab Results  Component Value Date   TRIG 120 08/31/2023   Lab Results  Component Value Date   CHOLHDL 3.5 08/31/2023   Lab Results  Component Value Date   CREATININE 1.25 11/21/2022   Lab Results  Component Value Date   GFR 70.55 11/21/2022   Lab Results  Component Value Date   MICROALBUR <0.7 11/26/2022      Component Value Date/Time   NA 139 11/21/2022 0908   K 4.2 11/21/2022 0908   CL 103 11/21/2022 0908   CO2 30 11/21/2022 0908   GLUCOSE 122 (H) 11/21/2022 0908   BUN 12 11/21/2022 0908   CREATININE 1.25 11/21/2022 0908   CREATININE 1.00 08/05/2014 1434   CALCIUM 9.3 11/21/2022 0908   PROT 7.1 11/21/2022 0908   ALBUMIN 4.1 11/21/2022 0908   AST 25 11/21/2022 0908   ALT 22 11/21/2022 0908   ALKPHOS 67 11/21/2022 0908   BILITOT 0.5 11/21/2022 0908   GFRNONAA >60 09/18/2022 1615   GFRAA >60 09/22/2018 1505      Latest Ref Rng & Units 11/21/2022    9:08 AM 09/18/2022    4:15 PM 05/20/2022   10:23 AM  BMP  Glucose 70 - 99 mg/dL 161  096  045   BUN 6 - 23 mg/dL 12  13  12    Creatinine 0.40 - 1.50 mg/dL 4.09  8.11  9.14   Sodium 135 - 145 mEq/L 139  137  138   Potassium 3.5 - 5.1 mEq/L 4.2  4.6  4.4   Chloride 96 - 112 mEq/L 103  105  101   CO2 19 - 32 mEq/L 30  27  26    Calcium 8.4 - 10.5 mg/dL 9.3  9.1  9.3        Component Value Date/Time   WBC 12.7 (H) 09/18/2022 1615   RBC 4.72 09/18/2022 1615   HGB 14.2 09/18/2022 1615   HCT 42.0 09/18/2022 1615   PLT 226 09/18/2022 1615   MCV 89.0 09/18/2022 1615   MCV 86.5 08/05/2014 1437   MCH 30.1 09/18/2022 1615   MCHC 33.8 09/18/2022 1615   RDW 12.1 09/18/2022 1615   LYMPHSABS 1.1 09/18/2022 1615   MONOABS 0.7 09/18/2022 1615   EOSABS 0.0 09/18/2022 1615    BASOSABS 0.0 09/18/2022 1615     Parts of this note may have been dictated using voice recognition software. There may be variances in spelling and vocabulary which are unintentional. Not all errors are proofread. Please notify the [facility] if any discrepancies are noted or if the meaning of any statement is not clear.  -+

## 2024-01-15 ENCOUNTER — Encounter: Payer: Self-pay | Admitting: "Endocrinology

## 2024-01-22 DIAGNOSIS — R3589 Other polyuria: Secondary | ICD-10-CM | POA: Diagnosis not present

## 2024-01-22 DIAGNOSIS — R109 Unspecified abdominal pain: Secondary | ICD-10-CM | POA: Diagnosis not present

## 2024-01-22 DIAGNOSIS — R1031 Right lower quadrant pain: Secondary | ICD-10-CM | POA: Diagnosis not present

## 2024-01-22 DIAGNOSIS — M545 Low back pain, unspecified: Secondary | ICD-10-CM | POA: Diagnosis not present

## 2024-01-22 DIAGNOSIS — R1013 Epigastric pain: Secondary | ICD-10-CM | POA: Diagnosis not present

## 2024-02-23 ENCOUNTER — Ambulatory Visit: Admitting: Skilled Nursing Facility1

## 2024-02-27 ENCOUNTER — Other Ambulatory Visit: Payer: Self-pay

## 2024-02-27 ENCOUNTER — Ambulatory Visit
Admission: EM | Admit: 2024-02-27 | Discharge: 2024-02-27 | Disposition: A | Discharge status: Home / Self Care | Attending: Internal Medicine | Admitting: Internal Medicine

## 2024-02-27 ENCOUNTER — Encounter: Payer: Self-pay | Admitting: Emergency Medicine

## 2024-02-27 DIAGNOSIS — H60393 Other infective otitis externa, bilateral: Secondary | ICD-10-CM | POA: Diagnosis not present

## 2024-02-27 MED ORDER — DEXAMETHASONE SODIUM PHOSPHATE 10 MG/ML IJ SOLN
10.0000 mg | Freq: Once | INTRAMUSCULAR | Status: AC
  Administered 2024-02-27: 10 mg via INTRAMUSCULAR

## 2024-02-27 MED ORDER — OFLOXACIN 0.3 % OT SOLN
5.0000 [drp] | Freq: Two times a day (BID) | OTIC | 0 refills | Status: AC
Start: 2024-02-27 — End: ?

## 2024-02-27 NOTE — ED Triage Notes (Signed)
 Pt here for bilateral ear pain with left starting first x 5 days

## 2024-02-27 NOTE — ED Provider Notes (Signed)
 [facility]    CSN: [id number] Arrival date & time: 02/27/24  9562      History   Chief Complaint Chief Complaint  Patient presents with   Otalgia    HPI Andre Brown is a 43 y.o. male.   43 year old male who presents urgent care with complaints of ear pain.  He reports that on Monday he started noticing some itching in his ears.  He had been working out in the yard prior to this.  This was mostly on the right side.  He reports that it got worse on Tuesday and on Wednesday he used a Q-tip to see what was inside his ear.  He was concerned there might be a bug.  He did get a large amount of wax out of the right ear.  He reports that the left ear is getting worse like the right now.  He relates that last night his right ear was incredibly uncomfortable and it was difficult for him to find a position to sleep.  Opening and closing his mouth even makes the pain worse.  He denies any fevers, chills, shortness of breath, sore throat, difficulty swallowing.   Otalgia Associated symptoms: no abdominal pain, no cough, no fever, no rash, no sore throat and no vomiting     Past Medical History:  Diagnosis Date   Diabetes mellitus without complication (HCC)    History of cataract    Hypothyroidism    SOBOE (shortness of breath on exertion)    Swelling of both lower extremities    Thyroid  disease     Patient Active Problem List   Diagnosis Date Noted   Genetic testing 08/04/2023   Elevated AST (SGOT) 05/23/2022   Elevated LFTs 05/23/2022   Diarrhea 05/23/2022   Generalized abdominal pain 05/23/2022   Change in bowel habit 05/23/2022   Distended abdomen 05/23/2022   Bloating 05/23/2022   Fatty liver 05/23/2022   Diabetes mellitus (HCC) 02/21/2021   At risk for hypoglycemia 02/21/2021   Other fatigue 02/06/2021   SOBOE (shortness of breath on exertion) 02/06/2021   Hyperlipidemia associated with type 2 diabetes mellitus (HCC) 02/06/2021   Other specified  hypothyroidism 02/06/2021   At risk for heart disease 02/06/2021   Vitamin D  deficiency 02/06/2021   Depression screening 02/06/2021   Weight gain 01/24/2021   Erectile dysfunction 11/14/2019   History of muscle stiffness 12/22/2018   Diabetes (HCC) 12/22/2018   Type 1 diabetes mellitus with hyperglycemia (HCC) 03/09/2018   Acquired hypothyroidism 03/09/2018    Past Surgical History:  Procedure Laterality Date   CATARACT EXTRACTION, BILATERAL  2021   WISDOM TOOTH EXTRACTION  2015       Home Medications    Prior to Admission medications   Medication Sig Start Date End Date Taking? Authorizing Provider  ofloxacin (FLOXIN) 0.3 % OTIC solution Place 5 drops into both ears 2 (two) times daily. For 5 days 02/27/24  Yes [place]ford Schnitker A, PA-C  B-D ULTRAFINE III SHORT PEN 31G X 8 MM MISC USE 4 TIMES A DAY 08/29/20   Gwyndolyn Lerner, MD  clobetasol cream (TEMOVATE) 0.05 % Apply 1 Application topically 2 (two) times daily. 04/22/23   [provider]  Continuous Glucose Sensor (DEXCOM G6 SENSOR) MISC 3 each by Other route See admin instructions. Apply one sensor to body once every 10 days; E11.9 06/19/23   Jorge Newcomer, MD  Continuous Glucose Transmitter (DEXCOM G6 TRANSMITTER) MISC USE AS DIRECTED 12/02/23   Jorge Newcomer, MD  diclofenac  (  VOLTAREN ) 75 MG EC tablet TAKE 1 TABLET (75 MG TOTAL) BY MOUTH 2 (TWO) TIMES DAILY BETWEEN MEALS AS NEEDED. 02/23/23   Arnie Lao, MD  furosemide (LASIX) 20 MG tablet Take 20 mg by mouth daily as needed. 01/10/21   [provider]  glucagon  (GLUCAGON  EMERGENCY) 1 MG injection Inject 1 mg into the muscle once as needed for up to 1 dose. 05/16/19   Gwyndolyn Lerner, MD  HUMALOG  KWIKPEN 200 UNIT/ML KwikPen INJECT UP TO 100 UNITS VIA INSULIN  PUMP DAILY 04/29/23   Jorge Newcomer, MD  insulin  aspart (NOVOLOG  FLEXPEN) 100 UNIT/ML FlexPen Inject 35 Units into the skin 3 (three) times daily with meals.    [provider]  insulin  degludec  (TRESIBA  FLEXTOUCH) 100 UNIT/ML FlexTouch Pen Inject 50 Units into the skin daily. 12/07/23   Motwani, Komal, MD  Insulin  Disposable Pump (OMNIPOD 5 DEXG7G6 PODS GEN 5) MISC CHANGE POD EVERY 3 DAYS AS DIRECTED 07/22/23   Motwani, Joanna Muck, MD  Insulin  Disposable Pump (OMNIPOD 5 G6 INTRO, GEN 5,) KIT Change pod every 3 days 02/12/22   Lajean Pike, MD  ketoconazole  (NIZORAL ) 2 % cream Apply 1 application topically daily. 08/31/19   Hall-Potvin, [place], PA-C  levothyroxine  (SYNTHROID ) 150 MCG tablet TAKE 1 TABLET BY MOUTH EVERY DAY 10/21/23   Motwani, Komal, MD  rosuvastatin (CRESTOR) 10 MG tablet Take 10 mg by mouth at bedtime. 02/05/21   [provider]  sildenafil  (VIAGRA ) 100 MG tablet TAKE 0.5-1 TABLETS (50-100 MG TOTAL) DAILY AS NEEDED FOR ERECTILE DYSFUNCTION. (6 PER 25 DAYS) 05/24/22   Lajean Pike, MD  Syringe/Needle, Disp, 30G X 1/2" 1 ML MISC Use to inject insulin  into to pump 03/07/22   Lajean Pike, MD  tirzepatide  (MOUNJARO ) 12.5 MG/0.5ML Pen Inject 12.5 mg into the skin once a week. 12/03/23   Motwani, Komal, MD  triamcinolone  cream (KENALOG ) 0.1 % Apply 1 application topically 2 (two) times daily. 08/31/19   Hall-Potvin, [place], PA-C    Family History Family History  Problem Relation Age of Onset   Breast cancer Mother 60       triple negative   Cancer Maternal Grandmother        unknown type   Colon polyps Maternal Uncle    Cancer Maternal Great-grandmother        unknown type   Pancreatic cancer Other        maternal great uncle's son   Diabetes Neg Hx    Esophageal cancer Neg Hx    Inflammatory bowel disease Neg Hx    Liver disease Neg Hx    Rectal cancer Neg Hx    Stomach cancer Neg Hx    Colon cancer Neg Hx     Social History Social History   Tobacco Use   Smoking status: Never   Smokeless tobacco: Never  Vaping Use   Vaping status: Never Used  Substance Use Topics   Alcohol use: Yes    Comment: Very limited   Drug use: No     Allergies   Lisinopril,  Loratadine, Losartan potassium, and Shellfish allergy   Review of Systems Review of Systems  Constitutional:  Negative for chills and fever.  HENT:  Positive for ear pain. Negative for sore throat.   Eyes:  Negative for pain and visual disturbance.  Respiratory:  Negative for cough and shortness of breath.   Cardiovascular:  Negative for chest pain and palpitations.  Gastrointestinal:  Negative for abdominal pain and vomiting.  Genitourinary:  Negative for  dysuria and hematuria.  Musculoskeletal:  Negative for arthralgias and back pain.  Skin:  Negative for color change and rash.  Neurological:  Negative for seizures and syncope.  All other systems reviewed and are negative.    Physical Exam Triage Vital Signs ED Triage Vitals  Encounter Vitals Group     BP --      Systolic BP Percentile --      Diastolic BP Percentile --      Pulse Rate 02/27/24 0843 85     Resp 02/27/24 0843 17     Temp 02/27/24 0843 97.9 F (36.6 C)     Temp Source 02/27/24 0843 Oral     SpO2 02/27/24 0843 97 %     Weight --      Height --      Head Circumference --      Peak Flow --      Pain Score 02/27/24 0854 4     Pain Loc --      Pain Education --      Exclude from Growth Chart --    No data found.  Updated Vital Signs Pulse 85   Temp 97.9 F (36.6 C) (Oral)   Resp 17   SpO2 97%   Visual Acuity Right Eye Distance:   Left Eye Distance:   Bilateral Distance:    Right Eye Near:   Left Eye Near:    Bilateral Near:     Physical Exam Vitals and nursing note reviewed.  Constitutional:      General: He is not in acute distress.    Appearance: He is well-developed.  HENT:     Head: Normocephalic and atraumatic.     Right Ear: Tympanic membrane normal. Swelling and tenderness present.     Left Ear: Tympanic membrane normal. Tenderness present.     Ears:     Comments: The right ear canal is edematous and erythematous but the tympanic membrane is clear, the left ear canal is normal in  appearance with perhaps some mild erythema Eyes:     Conjunctiva/sclera: Conjunctivae normal.  Cardiovascular:     Rate and Rhythm: Normal rate and regular rhythm.     Heart sounds: No murmur heard. Pulmonary:     Effort: Pulmonary effort is normal. No respiratory distress.     Breath sounds: Normal breath sounds.  Abdominal:     Palpations: Abdomen is soft.     Tenderness: There is no abdominal tenderness.  Musculoskeletal:        General: No swelling.     Cervical back: Neck supple.  Skin:    General: Skin is warm and dry.     Capillary Refill: Capillary refill takes less than 2 seconds.  Neurological:     Mental Status: He is alert.  Psychiatric:        Mood and Affect: Mood normal.      UC Treatments / Results  Labs (all labs ordered are listed, but only abnormal results are displayed) Labs Reviewed - No data to display  EKG   Radiology No results found.  Procedures Procedures (including critical care time)  Medications Ordered in UC Medications  dexamethasone (DECADRON) injection 10 mg (10 mg Intramuscular Given 02/27/24 0917)    Initial Impression / Assessment and Plan / UC Course  I have reviewed the triage vital signs and the nursing notes.  Pertinent labs & imaging results that were available during my care of the patient were reviewed by me and considered  in my medical decision making (see chart for details).     Infective otitis externa of both ears   Physical exam findings and symptoms are most consistent with an ear canal infection or otitis externa. This is treated with antibiotic ear drops.  Due to the extensive swelling in the right ear canal we offered a steroid injection to help with the swelling.  We will treat with the following:  Decadron injection given today. This is a steroid to help with inflammation and pain.  Ofloxacin 5 drops in both ear twice daily for 5 days.  Do not use q-tips in your ear canals. Can use them to clean external  ear Can try debrox over the counter to help with ear wax if this builds up. Return to urgent care or PCP if symptoms worsen or fail to resolve.    Final Clinical Impressions(s) / UC Diagnoses   Final diagnoses:  Infective otitis externa of both ears     Discharge Instructions      Physical exam findings and symptoms are most consistent with an ear canal infection or otitis externa. This is treated with antibiotic ear drops. We will treat with the following:  Decadron injection given today. This is a steroid to help with inflammation and pain. This may cause a mild increase in your blood sugars today. Ofloxacin 5 drops in both ear twice daily for 5 days.  Do not use q-tips in your ear canals. Can use them to clean external ear Can try debrox over the counter to help with ear wax if this builds up. Return to urgent care or PCP if symptoms worsen or fail to resolve.    ED Prescriptions     Medication Sig Dispense Auth. Provider   ofloxacin (FLOXIN) 0.3 % OTIC solution Place 5 drops into both ears 2 (two) times daily. For 5 days 5 mL Kreg Pesa, [place]      PDMP not reviewed this encounter.   Kreg Pesa, [place] 02/27/24 [phone]

## 2024-02-27 NOTE — Discharge Instructions (Addendum)
 Physical exam findings and symptoms are most consistent with an ear canal infection or otitis externa. This is treated with antibiotic ear drops. We will treat with the following:  Decadron injection given today. This is a steroid to help with inflammation and pain. This may cause a mild increase in your blood sugars today. Ofloxacin 5 drops in both ear twice daily for 5 days.  Do not use q-tips in your ear canals. Can use them to clean external ear Can try debrox over the counter to help with ear wax if this builds up. Return to urgent care or PCP if symptoms worsen or fail to resolve.

## 2024-03-15 ENCOUNTER — Ambulatory Visit: Admitting: Skilled Nursing Facility1

## 2024-03-23 DIAGNOSIS — E119 Type 2 diabetes mellitus without complications: Secondary | ICD-10-CM | POA: Diagnosis not present

## 2024-03-23 LAB — HM DIABETES EYE EXAM

## 2024-04-05 ENCOUNTER — Other Ambulatory Visit (HOSPITAL_COMMUNITY): Payer: Self-pay

## 2024-04-05 ENCOUNTER — Telehealth: Payer: Self-pay

## 2024-04-05 NOTE — Telephone Encounter (Signed)
 Pt needs PA done for humalog . Pt will be out of insulin  this week.

## 2024-04-05 NOTE — Telephone Encounter (Signed)
 Pharmacy Patient Advocate Encounter   Received notification from Pt Calls Messages that prior authorization for HumaLOG  KwikPen 200UNIT/ML pen-injectors is required/requested.   Insurance verification completed.   The patient is insured through [facility] .   Per test claim: PA required; PA started via CoverMyMeds. KEY  [id number] . Waiting for clinical questions to populate.

## 2024-04-06 NOTE — Telephone Encounter (Signed)
 Clinical info has been submitted

## 2024-04-06 NOTE — Telephone Encounter (Signed)
 Pharmacy Patient Advocate Encounter  Received notification from [employer] that Prior Authorization for HumaLOG  KwikPen 200UNIT/ML pen-injectors has been APPROVED from 04/06/24 to 04/06/25   PA #/Case ID/Reference #: [id number]

## 2024-04-07 ENCOUNTER — Other Ambulatory Visit

## 2024-04-14 ENCOUNTER — Ambulatory Visit: Admitting: "Endocrinology

## 2024-04-19 ENCOUNTER — Ambulatory Visit: Admitting: Skilled Nursing Facility1

## 2024-04-24 ENCOUNTER — Other Ambulatory Visit: Payer: Self-pay | Admitting: "Endocrinology

## 2024-04-25 NOTE — Telephone Encounter (Signed)
Requested Prescriptions     Pending Prescriptions Disp Refills   . levothyroxine (SYNTHROID) 150 MCG tablet [Pharmacy Med Name: LEVOTHYROXINE 150 MCG TABLET] 90 tablet 1     Sig: TAKE 1 TABLET BY MOUTH EVERY DAY

## 2024-05-11 DIAGNOSIS — Z0000 Encounter for general adult medical examination without abnormal findings: Secondary | ICD-10-CM | POA: Diagnosis not present

## 2024-05-11 DIAGNOSIS — I519 Heart disease, unspecified: Secondary | ICD-10-CM | POA: Diagnosis not present

## 2024-05-11 DIAGNOSIS — E7800 Pure hypercholesterolemia, unspecified: Secondary | ICD-10-CM | POA: Diagnosis not present

## 2024-05-18 ENCOUNTER — Ambulatory Visit: Admitting: "Endocrinology

## 2024-05-18 ENCOUNTER — Ambulatory Visit (INDEPENDENT_AMBULATORY_CARE_PROVIDER_SITE_OTHER): Admitting: "Endocrinology

## 2024-05-18 ENCOUNTER — Encounter: Payer: Self-pay | Admitting: "Endocrinology

## 2024-05-18 VITALS — BP 102/60 | HR 76 | Resp 20 | Ht 68.0 in | Wt 211.4 lb

## 2024-05-18 DIAGNOSIS — E7800 Pure hypercholesterolemia, unspecified: Secondary | ICD-10-CM

## 2024-05-18 DIAGNOSIS — Z7985 Long-term (current) use of injectable non-insulin antidiabetic drugs: Secondary | ICD-10-CM

## 2024-05-18 DIAGNOSIS — E1065 Type 1 diabetes mellitus with hyperglycemia: Secondary | ICD-10-CM

## 2024-05-18 DIAGNOSIS — Z9641 Presence of insulin pump (external) (internal): Secondary | ICD-10-CM

## 2024-05-18 LAB — POCT GLYCOSYLATED HEMOGLOBIN (HGB A1C): Hemoglobin A1C: 6.9 % — AB (ref 4.0–5.6)

## 2024-05-18 MED ORDER — TIRZEPATIDE 15 MG/0.5ML ~~LOC~~ SOAJ
15.0000 mg | SUBCUTANEOUS | 3 refills | Status: AC
Start: 2024-05-18 — End: ?

## 2024-05-18 NOTE — Progress Notes (Signed)
 Outpatient Endocrinology Note Andre Birmingham, MD  05/18/24   Andre Brown 1981/03/03 [id number]  Referring Provider: Rexanne Ingle, MD Primary Care Provider: Rexanne Ingle, MD Reason for consultation: Subjective   Assessment & Plan  Diagnoses and all orders for this visit:  Type 1 diabetes mellitus with hyperglycemia (HCC) -     Lipid panel -     Microalbumin / creatinine urine ratio -     POCT glycosylated hemoglobin (Hb A1C)  Insulin  pump in place  Long-term (current) use of injectable non-insulin  antidiabetic drugs  Pure hypercholesterolemia  Other orders -     tirzepatide  (MOUNJARO ) 15 MG/0.5ML Pen; Inject 15 mg into the skin once a week.   Diabetes complicated by hypoglycemia Hba1c goal less than 7.0, current Hba1c is  Lab Results  Component Value Date   HGBA1C 6.9 (A) 05/18/2024   HGBA1C 6.8 (A) 12/03/2023   HGBA1C 7.1 (H) 08/31/2023   Will recommend the following: Continue INSULIN  pump settings with HUMALOG  U-200 insulin : BASAL rate 0.6 at midnight and 0.7 at 6 AM BOLUS settings: Carb ratio 1:2 , Sensitivity = 1: 80, target 120 12 AM to 6 AM and then 110 Active insulin  5 hours  Patient undershoots carbs to prevent hypoglycemia (not accurante in carb counting, will need DM education if BG continued to be irregular) Mounjaro  15 mg weekly - for weight loss. No S/E  Has Tresiba  as back up   No known contraindications to any of above medications Glucagon  - pt has it  Hyperlipidemia -Last LDL near goal: 99 (repeat ordered) -on rosuvastatin 10 mg every day, not interested in dose increment, reinforced compliance  -Follow low fat diet and exercise   -Blood pressure goal <140/90 - Microalbumin/creatinine at goal < 30 -not on ACE/ARB  -diet changes including salt restriction -limit eating outside -counseled BP targets per standards of diabetes care -Uncontrolled blood pressure can lead to retinopathy, nephropathy and cardiovascular and  atherosclerotic heart disease  Reviewed and counseled on: -A1C target -Blood sugar targets -Complications of uncontrolled diabetes  -Checking blood sugar before meals and bedtime and bring log next visit -All medications with mechanism of action and side effects -Hypoglycemia management: rule of 15's, Glucagon  Emergency Kit and medical alert ID -low-carb low-fat plate-method diet -At least 20 minutes of physical activity per day -Annual dilated retinal eye exam and foot exam -compliance and follow up needs -follow up as scheduled or earlier if problem gets worse  Call if blood sugar is less than 70 or consistently above 250    Take a 15 gm snack of carbohydrate at bedtime before you go to sleep if your blood sugar is less than 100.    If you are going to fast after midnight for a test or procedure, ask your physician for instructions on how to reduce/decrease your insulin  dose.    Call if blood sugar is less than 70 or consistently above 250  -Treating a low sugar by rule of 15  (15 gms of sugar every 15 min until sugar is more than 70) If you feel your sugar is low, test your sugar to be sure If your sugar is low (less than 70), then take 15 grams of a fast acting Carbohydrate (3-4 glucose tablets or glucose gel or 4 ounces of juice or regular soda) Recheck your sugar 15 min after treating low to make sure it is more than 70 If sugar is still less than 70, treat again with 15 grams of carbohydrate  Don't drive the hour of hypoglycemia  If unconscious/unable to eat or drink by mouth, use glucagon  injection or nasal spray baqsimi  and call 911. Can repeat again in 15 min if still unconscious.  Return in about 3 months (around 08/18/2024) for visit, labs today.   I have reviewed current medications, nurse's notes, allergies, vital signs, past medical and surgical history, family medical history, and social history for this encounter. Counseled patient on symptoms, examination  findings, lab findings, imaging results, treatment decisions and monitoring and prognosis. The patient understood the recommendations and agrees with the treatment plan. All questions regarding treatment plan were fully answered.  Andre Birmingham, MD  05/18/24   History of Present Illness Andre Brown is a 43 y.o. year old male who presents for follow up of Type 1 diabetes mellitus.  Hypothyroidism managed by PCP  Andre Brown was first diagnosed of diabetes in 1999 at 89.   Diabetes education +  Home diabetes regimen: Mounjaro  12.5mg /week INSULIN  pump settings with HUMALOG  U-200 insulin : BASAL rate 0.6 at midnight and 0.7 at 6 AM BOLUS settings: Carb ratio 1:3 until 5 pm, then 1:2, SENSITIVITY = 1: 80, target 120 12 AM to 6 AM and then 110 Active insulin  5 hours Correction threshold 130, target 120 at midnight and 110 at 6 AM  Non-insulin  hypoglycemic drugs: Mounjaro  12.5 mg weekly  Prior history:   He was reportedly started on insulin  2002 He has been taking Lantus insulin  previously and subsequently Tresiba  Also in 2022 he was given Trulicity   PREVIOUS INSULIN  regimen :50 units Tresiba  in the morning daily NOVOLOG  mealtime doses 12 units + carbohydrate ratio 1:5  COMPLICATIONS -  MI/Stroke -  retinopathy, last eye exam 03/2024 -  neuropathy -  nephropathy  BLOOD SUGAR DATA CGM interpretation: At today's visit, we reviewed her CGM downloads. The full report is scanned in the media. Reviewing the CGM trends, BG are  elevated after break fast and lunch and improve thereafter.  Physical Exam  BP 102/60   Pulse 76   Resp 20   Ht 5' 8 (1.727 m)   Wt 211 lb 6.4 oz (95.9 kg)   SpO2 97%   BMI 32.14 kg/m    Constitutional: well developed, well nourished Head: normocephalic, atraumatic Eyes: sclera anicteric, no redness Neck: supple Lungs: normal respiratory effort Neurology: alert and oriented Skin: dry, no appreciable rashes Musculoskeletal: no appreciable  defects Psychiatric: normal mood and affect Diabetic Foot Exam - Simple   Simple Foot Form Diabetic Foot exam was performed with the following findings: Yes 05/18/2024 11:48 AM  Visual Inspection No deformities, no ulcerations, no other skin breakdown bilaterally: Yes Sensation Testing Intact to touch and monofilament testing bilaterally: Yes Pulse Check Posterior Tibialis and Dorsalis pulse intact bilaterally: Yes Comments      Current Medications Patient's Medications  New Prescriptions   TIRZEPATIDE  (MOUNJARO ) 15 MG/0.5ML PEN    Inject 15 mg into the skin once a week.  Previous Medications   B-D ULTRAFINE III SHORT PEN 31G X 8 MM MISC    USE 4 TIMES A DAY   CLOBETASOL CREAM (TEMOVATE) 0.05 %    Apply 1 Application topically 2 (two) times daily.   CONTINUOUS GLUCOSE SENSOR (DEXCOM G6 SENSOR) MISC    3 each by Other route See admin instructions. Apply one sensor to body once every 10 days; E11.9   CONTINUOUS GLUCOSE TRANSMITTER (DEXCOM G6 TRANSMITTER) MISC    USE AS DIRECTED   DICLOFENAC  (VOLTAREN ) 75 MG EC TABLET  TAKE 1 TABLET (75 MG TOTAL) BY MOUTH 2 (TWO) TIMES DAILY BETWEEN MEALS AS NEEDED.   FUROSEMIDE (LASIX) 20 MG TABLET    Take 20 mg by mouth daily as needed.   GLUCAGON  (GLUCAGON  EMERGENCY) 1 MG INJECTION    Inject 1 mg into the muscle once as needed for up to 1 dose.   HUMALOG  KWIKPEN 200 UNIT/ML KWIKPEN    INJECT UP TO 100 UNITS VIA INSULIN  PUMP DAILY   INSULIN  ASPART (NOVOLOG  FLEXPEN) 100 UNIT/ML FLEXPEN    Inject 35 Units into the skin 3 (three) times daily with meals.   INSULIN  DEGLUDEC (TRESIBA  FLEXTOUCH) 100 UNIT/ML FLEXTOUCH PEN    Inject 50 Units into the skin daily.   INSULIN  DISPOSABLE PUMP (OMNIPOD 5 DEXG7G6 PODS GEN 5) MISC    CHANGE POD EVERY 3 DAYS AS DIRECTED   INSULIN  DISPOSABLE PUMP (OMNIPOD 5 G6 INTRO, GEN 5,) KIT    Change pod every 3 days   KETOCONAZOLE  (NIZORAL ) 2 % CREAM    Apply 1 application topically daily.   LEVOTHYROXINE  (SYNTHROID ) 150 MCG  TABLET    TAKE 1 TABLET BY MOUTH EVERY DAY   OFLOXACIN  (FLOXIN ) 0.3 % OTIC SOLUTION    Place 5 drops into both ears 2 (two) times daily. For 5 days   ROSUVASTATIN (CRESTOR) 10 MG TABLET    Take 10 mg by mouth at bedtime.   SILDENAFIL  (VIAGRA ) 100 MG TABLET    TAKE 0.5-1 TABLETS (50-100 MG TOTAL) DAILY AS NEEDED FOR ERECTILE DYSFUNCTION. (6 PER 25 DAYS)   SYRINGE/NEEDLE, DISP, 30G X 1/2 1 ML MISC    Use to inject insulin  into to pump   TRIAMCINOLONE  CREAM (KENALOG ) 0.1 %    Apply 1 application topically 2 (two) times daily.  Modified Medications   No medications on file  Discontinued Medications   TIRZEPATIDE  (MOUNJARO ) 12.5 MG/0.5ML PEN    Inject 12.5 mg into the skin once a week.    Allergies Allergies  Allergen Reactions   Lisinopril Other (See Comments)   Loratadine Other (See Comments)   Losartan Potassium Other (See Comments)   Shellfish Allergy     Itchy throat    Past Medical History Past Medical History:  Diagnosis Date   Diabetes mellitus without complication (HCC)    History of cataract    Hypothyroidism    SOBOE (shortness of breath on exertion)    Swelling of both lower extremities    Thyroid  disease     Past Surgical History Past Surgical History:  Procedure Laterality Date   CATARACT EXTRACTION, BILATERAL  2021   WISDOM TOOTH EXTRACTION  2015    Family History family history includes Breast cancer (age of onset: 43) in his mother; Cancer in his maternal grandmother and maternal great-grandmother; Colon polyps in his maternal uncle; Pancreatic cancer in an other family member.  Social History Social History   Socioeconomic History   Marital status: Married    Spouse name: Chrystopher Stangl   Number of children: 2   Years of education: Not on file   Highest education level: Not on file  Occupational History   Occupation: Production manager   Occupation: Animal nutritionist  Tobacco Use   Smoking status: Never   Smokeless tobacco: Never  Vaping Use   Vaping status:  Never Used  Substance and Sexual Activity   Alcohol use: Yes    Comment: Very limited   Drug use: No   Sexual activity: Yes    Birth control/protection: None  Comment: Last encounter: first week of Sept 2024, Recieved oral sex.  Other Topics Concern   Not on file  Social History Narrative   Not on file   Social Drivers of Health   Financial Resource Strain: Not on file  Food Insecurity: Not on file  Transportation Needs: Not on file  Physical Activity: Not on file  Stress: Not on file  Social Connections: Not on file  Intimate Partner Violence: Not on file    Lab Results  Component Value Date   HGBA1C 6.9 (A) 05/18/2024   HGBA1C 6.8 (A) 12/03/2023   HGBA1C 7.1 (H) 08/31/2023   Lab Results  Component Value Date   CHOL 170 08/31/2023   Lab Results  Component Value Date   HDL 49 08/31/2023   Lab Results  Component Value Date   LDLCALC 99 08/31/2023   Lab Results  Component Value Date   TRIG 120 08/31/2023   Lab Results  Component Value Date   CHOLHDL 3.5 08/31/2023   Lab Results  Component Value Date   CREATININE 1.25 11/21/2022   Lab Results  Component Value Date   GFR 70.55 11/21/2022   No results found for: MACKEY CURRENT     Component Value Date/Time   NA 139 11/21/2022 0908   K 4.2 11/21/2022 0908   CL 103 11/21/2022 0908   CO2 30 11/21/2022 0908   GLUCOSE 122 (H) 11/21/2022 0908   BUN 12 11/21/2022 0908   CREATININE 1.25 11/21/2022 0908   CREATININE 1.00 08/05/2014 1434   CALCIUM 9.3 11/21/2022 0908   PROT 7.1 11/21/2022 0908   ALBUMIN 4.1 11/21/2022 0908   AST 25 11/21/2022 0908   ALT 22 11/21/2022 0908   ALKPHOS 67 11/21/2022 0908   BILITOT 0.5 11/21/2022 0908   GFRNONAA >60 09/18/2022 1615   GFRAA >60 09/22/2018 1505      Latest Ref Rng & Units 11/21/2022    9:08 AM 09/18/2022    4:15 PM 05/20/2022   10:23 AM  BMP  Glucose 70 - 99 mg/dL 877  844  856   BUN 6 - 23 mg/dL 12  13  12    Creatinine 0.40 - 1.50 mg/dL  8.74  8.70  8.83   Sodium 135 - 145 mEq/L 139  137  138   Potassium 3.5 - 5.1 mEq/L 4.2  4.6  4.4   Chloride 96 - 112 mEq/L 103  105  101   CO2 19 - 32 mEq/L 30  27  26    Calcium 8.4 - 10.5 mg/dL 9.3  9.1  9.3        Component Value Date/Time   WBC 12.7 (H) 09/18/2022 1615   RBC 4.72 09/18/2022 1615   HGB 14.2 09/18/2022 1615   HCT 42.0 09/18/2022 1615   PLT 226 09/18/2022 1615   MCV 89.0 09/18/2022 1615   MCV 86.5 08/05/2014 1437   MCH 30.1 09/18/2022 1615   MCHC 33.8 09/18/2022 1615   RDW 12.1 09/18/2022 1615   LYMPHSABS 1.1 09/18/2022 1615   MONOABS 0.7 09/18/2022 1615   EOSABS 0.0 09/18/2022 1615   BASOSABS 0.0 09/18/2022 1615     Parts of this note may have been dictated using voice recognition software. There may be variances in spelling and vocabulary which are unintentional. Not all errors are proofread. Please notify the dino if any discrepancies are noted or if the meaning of any statement is not clear.  -+

## 2024-05-18 NOTE — Patient Instructions (Signed)

## 2024-05-19 LAB — MICROALBUMIN / CREATININE URINE RATIO
Creatinine, Urine: 398 mg/dL — ABNORMAL HIGH (ref 20–320)
Microalb Creat Ratio: 1 mg/g{creat} (ref <30)
Microalb, Ur: 0.5 mg/dL

## 2024-05-19 LAB — LIPID PANEL
Cholesterol: 152 mg/dL (ref <200)
HDL: 55 mg/dL (ref >=40)
LDL Cholesterol (Calc): 86 mg/dL
Non-HDL Cholesterol (Calc): 97 mg/dL (ref <130)
Total CHOL/HDL Ratio: 2.8 (calc) (ref <5.0)
Triglycerides: 40 mg/dL (ref <150)

## 2024-05-20 ENCOUNTER — Emergency Department (HOSPITAL_COMMUNITY)
Admission: EM | Admit: 2024-05-20 | Discharge: 2024-05-20 | Disposition: A | Discharge status: Home / Self Care | Attending: Emergency Medicine | Admitting: Emergency Medicine

## 2024-05-20 ENCOUNTER — Emergency Department (HOSPITAL_COMMUNITY)

## 2024-05-20 ENCOUNTER — Other Ambulatory Visit: Payer: Self-pay

## 2024-05-20 ENCOUNTER — Encounter (HOSPITAL_COMMUNITY): Payer: Self-pay

## 2024-05-20 DIAGNOSIS — S0081XA Abrasion of other part of head, initial encounter: Secondary | ICD-10-CM | POA: Insufficient documentation

## 2024-05-20 DIAGNOSIS — Z794 Long term (current) use of insulin: Secondary | ICD-10-CM | POA: Diagnosis not present

## 2024-05-20 DIAGNOSIS — S46912A Strain of unspecified muscle, fascia and tendon at shoulder and upper arm level, left arm, initial encounter: Secondary | ICD-10-CM | POA: Insufficient documentation

## 2024-05-20 DIAGNOSIS — S299XXA Unspecified injury of thorax, initial encounter: Secondary | ICD-10-CM | POA: Diagnosis not present

## 2024-05-20 DIAGNOSIS — Y92410 Unspecified street and highway as the place of occurrence of the external cause: Secondary | ICD-10-CM | POA: Diagnosis not present

## 2024-05-20 DIAGNOSIS — M25561 Pain in right knee: Secondary | ICD-10-CM | POA: Insufficient documentation

## 2024-05-20 DIAGNOSIS — S3991XA Unspecified injury of abdomen, initial encounter: Secondary | ICD-10-CM | POA: Diagnosis not present

## 2024-05-20 DIAGNOSIS — S0990XA Unspecified injury of head, initial encounter: Secondary | ICD-10-CM | POA: Diagnosis not present

## 2024-05-20 DIAGNOSIS — S3993XA Unspecified injury of pelvis, initial encounter: Secondary | ICD-10-CM | POA: Diagnosis not present

## 2024-05-20 DIAGNOSIS — E109 Type 1 diabetes mellitus without complications: Secondary | ICD-10-CM | POA: Diagnosis not present

## 2024-05-20 DIAGNOSIS — R42 Dizziness and giddiness: Secondary | ICD-10-CM | POA: Diagnosis not present

## 2024-05-20 DIAGNOSIS — S02832A Fracture of medial orbital wall, left side, initial encounter for closed fracture: Secondary | ICD-10-CM | POA: Diagnosis not present

## 2024-05-20 DIAGNOSIS — M542 Cervicalgia: Secondary | ICD-10-CM | POA: Diagnosis not present

## 2024-05-20 DIAGNOSIS — S8991XA Unspecified injury of right lower leg, initial encounter: Secondary | ICD-10-CM | POA: Diagnosis not present

## 2024-05-20 LAB — I-STAT CHEM 8, ED
BUN: 11 mg/dL (ref 6–20)
Calcium, Ion: 1.09 mmol/L — ABNORMAL LOW (ref 1.15–1.40)
Chloride: 103 mmol/L (ref 98–111)
Creatinine, Ser: 1.3 mg/dL — ABNORMAL HIGH (ref 0.61–1.24)
Glucose, Bld: 104 mg/dL — ABNORMAL HIGH (ref 70–99)
HCT: 42 % (ref 39.0–52.0)
Hemoglobin: 14.3 g/dL (ref 13.0–17.0)
Potassium: 3.9 mmol/L (ref 3.5–5.1)
Sodium: 140 mmol/L (ref 135–145)
TCO2: 25 mmol/L (ref 22–32)

## 2024-05-20 LAB — CBG MONITORING, ED
Glucose-Capillary: 122 mg/dL — ABNORMAL HIGH (ref 70–99)
Glucose-Capillary: 88 mg/dL (ref 70–99)
Glucose-Capillary: 108 mg/dL — ABNORMAL HIGH (ref 70–99)

## 2024-05-20 LAB — CBC
HCT: 42.2 % (ref 39.0–52.0)
Hemoglobin: 14.7 g/dL (ref 13.0–17.0)
MCH: 30.2 pg (ref 26.0–34.0)
MCHC: 34.8 g/dL (ref 30.0–36.0)
MCV: 86.8 fL (ref 80.0–100.0)
Platelets: 224 K/uL (ref 150–400)
RBC: 4.86 MIL/uL (ref 4.22–5.81)
RDW: 12 % (ref 11.5–15.5)
WBC: 7 K/uL (ref 4.0–10.5)
nRBC: 0 % (ref 0.0–0.2)

## 2024-05-20 LAB — ETHANOL: Alcohol, Ethyl (B): <15 mg/dL (ref <15)

## 2024-05-20 LAB — I-STAT CG4 LACTIC ACID, ED: Lactic Acid, Venous: 1.9 mmol/L (ref 0.5–1.9)

## 2024-05-20 MED ORDER — IOHEXOL 350 MG/ML SOLN
75.0000 mL | Freq: Once | INTRAVENOUS | Status: AC | PRN
  Administered 2024-05-20: 75 mL via INTRAVENOUS

## 2024-05-20 MED ORDER — METHOCARBAMOL 500 MG PO TABS: 500.0000 mg | ORAL_TABLET | Freq: Two times a day (BID) | ORAL | 0 refills | Status: AC
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
## 2024-05-20 NOTE — ED Notes (Addendum)
 Patient verbalizes understanding of discharge instructions. Opportunity for questioning and answers were provided. Armband removed by staff, pt discharged from ED. Ambulated out to lobby with wife, left with all belongings

## 2024-05-20 NOTE — ED Notes (Signed)
 Taken to CT with Therisa, RN

## 2024-05-20 NOTE — ED Notes (Signed)
 CCMD called to admit the patient to cardiac monitoring services.
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
## 2024-05-20 NOTE — Discharge Instructions (Addendum)
 All the x-rays today were okay.  There is no sign of internal bleeding or broken bones.  All the bones in your shoulder look okay but if it continues to hurt you within the next 1 to 2 weeks you can follow-up at [facility].  You can call for an appointment but they also have a walk-in clinic.  Avoid any heavy lifting with that arm.  You can use the muscle relaxer as needed for aches and pains and you can also use 2 Tylenol  and 2 ibuprofen  together every 6 hours as needed for pain

## 2024-05-20 NOTE — ED Notes (Signed)
 Pt ambulated independently and steadily and without assistance in his room. No dizziness reported

## 2024-05-24 ENCOUNTER — Encounter: Payer: Self-pay | Admitting: "Endocrinology

## 2024-05-24 ENCOUNTER — Other Ambulatory Visit: Payer: Self-pay | Admitting: "Endocrinology

## 2024-05-24 ENCOUNTER — Encounter: Payer: Self-pay | Admitting: Physician Assistant

## 2024-05-24 ENCOUNTER — Other Ambulatory Visit: Payer: Self-pay

## 2024-05-24 ENCOUNTER — Ambulatory Visit (INDEPENDENT_AMBULATORY_CARE_PROVIDER_SITE_OTHER): Payer: Self-pay | Admitting: Physician Assistant

## 2024-05-24 DIAGNOSIS — E10649 Type 1 diabetes mellitus with hypoglycemia without coma: Secondary | ICD-10-CM

## 2024-05-24 DIAGNOSIS — M25512 Pain in left shoulder: Secondary | ICD-10-CM | POA: Diagnosis not present

## 2024-05-24 MED ORDER — HUMALOG KWIKPEN 200 UNIT/ML ~~LOC~~ SOPN: PEN_INJECTOR | SUBCUTANEOUS | 3 refills | Status: AC
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
## 2024-06-02 ENCOUNTER — Ambulatory Visit: Admitting: Physician Assistant

## 2024-06-02 DIAGNOSIS — M25512 Pain in left shoulder: Secondary | ICD-10-CM

## 2024-06-03 ENCOUNTER — Encounter: Payer: Self-pay | Admitting: Physician Assistant
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
## 2024-06-07 ENCOUNTER — Other Ambulatory Visit: Payer: Self-pay | Admitting: Physician Assistant

## 2024-06-07 ENCOUNTER — Telehealth: Payer: Self-pay | Admitting: Orthopedic Surgery

## 2024-06-07 DIAGNOSIS — M25512 Pain in left shoulder: Secondary | ICD-10-CM

## 2024-06-07 NOTE — Telephone Encounter (Signed)
 Order wasn't in chart, I placed MRI order for left shoulder.

## 2024-06-07 NOTE — Telephone Encounter (Signed)
 Andre Brown is calling in to check on the status of his Left Shoulder MRI.  I could not find an order, but it is dictated in patient's last exam note.  Please call [phone].

## 2024-06-07 NOTE — Telephone Encounter (Signed)
 Betsy aware and she will send it over.

## 2024-06-08 ENCOUNTER — Telehealth: Payer: Self-pay | Admitting: Orthopedic Surgery
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
## 2024-06-08 NOTE — Telephone Encounter (Signed)
 Sari from [employer] called requesting a call from [employer] or [place] about a pre auth issues for pt upcoming MRI. Please call Sari from [place] at [phone].

## 2024-06-09 ENCOUNTER — Other Ambulatory Visit: Payer: Self-pay | Admitting: "Endocrinology

## 2024-06-09 DIAGNOSIS — E10649 Type 1 diabetes mellitus with hypoglycemia without coma: Secondary | ICD-10-CM

## 2024-06-10 ENCOUNTER — Encounter: Payer: Self-pay | Admitting: Physician Assistant

## 2024-06-13 ENCOUNTER — Telehealth: Payer: Self-pay | Admitting: Dietician

## 2024-06-13 DIAGNOSIS — E1065 Type 1 diabetes mellitus with hyperglycemia: Secondary | ICD-10-CM
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
## 2024-06-13 NOTE — Telephone Encounter (Signed)
 Patient returned call. Patient put in pump settings provided from MD note 8/27 and also documented in my note earlier today. He now has an Active POD. He chooses not to use the iPhone Omnipod app at this time as he states tech support told him they are working out some issues with the app on the iPhone17.  He wishes to try the Dexcom G7 CGM.  Will message MD.  Leita Constable, RD, LDN, CDCES, DipACLM

## 2024-06-14 MED ORDER — DEXCOM G6 TRANSMITTER MISC: 3 refills | Status: AC

## 2024-06-14 MED ORDER — DEXCOM G6 SENSOR MISC: 3.0000 | 12 refills | Status: DC

## 2024-06-14 NOTE — Telephone Encounter (Signed)
 Requested Prescriptions   Signed Prescriptions Disp Refills   Continuous Glucose Sensor (DEXCOM G6 SENSOR) MISC 9 each 12    Sig: 3 each by Other route See admin instructions. Apply one sensor to body once every 10 days; E11.9    Authorizing Provider: DARTHA ERNST    Ordering User: ARLOA, Levander Katzenstein N   Continuous Glucose Transmitter (DEXCOM G6 TRANSMITTER) MISC 1 each 3    Sig: USE AS DIRECTED    Authorizing Provider: DARTHA ERNST    Ordering User: ARLOA JEOFFREY SAILOR

## 2024-06-15 ENCOUNTER — Ambulatory Visit: Admitting: Physician Assistant

## 2024-06-18 ENCOUNTER — Ambulatory Visit
Admission: RE | Admit: 2024-06-18 | Discharge: 2024-06-18 | Disposition: A | Source: Ambulatory Visit | Attending: Physician Assistant | Admitting: Physician Assistant

## 2024-06-18 DIAGNOSIS — M25512 Pain in left shoulder: Secondary | ICD-10-CM

## 2024-06-29 ENCOUNTER — Other Ambulatory Visit: Payer: Self-pay

## 2024-06-29 ENCOUNTER — Ambulatory Visit: Admitting: Orthopedic Surgery

## 2024-06-29 ENCOUNTER — Encounter: Payer: Self-pay | Admitting: Orthopedic Surgery

## 2024-06-29 DIAGNOSIS — M25512 Pain in left shoulder: Secondary | ICD-10-CM | POA: Diagnosis not present

## 2024-06-29 DIAGNOSIS — M7502 Adhesive capsulitis of left shoulder: Secondary | ICD-10-CM

## 2024-06-29 MED ORDER — BUPIVACAINE HCL 0.5 % IJ SOLN
9.0000 mL | INTRAMUSCULAR | Status: AC | PRN
  Administered 2024-06-29: 9 mL via INTRA_ARTICULAR

## 2024-06-29 MED ORDER — TRIAMCINOLONE ACETONIDE 40 MG/ML IJ SUSP
40.0000 mg | INTRAMUSCULAR | Status: AC | PRN
  Administered 2024-06-29: 40 mg via INTRA_ARTICULAR

## 2024-06-29 MED ORDER — LIDOCAINE HCL 1 % IJ SOLN
5.0000 mL | INTRAMUSCULAR | Status: AC | PRN
  Administered 2024-06-29: 5 mL

## 2024-06-29 MED ORDER — MELOXICAM 15 MG PO TABS: 15.0000 mg | ORAL_TABLET | Freq: Every day | ORAL | 0 refills | Status: DC
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
## 2024-07-23 ENCOUNTER — Other Ambulatory Visit: Payer: Self-pay | Admitting: "Endocrinology

## 2024-07-23 DIAGNOSIS — E1065 Type 1 diabetes mellitus with hyperglycemia: Secondary | ICD-10-CM

## 2024-07-25 ENCOUNTER — Encounter: Payer: Self-pay | Admitting: Radiology

## 2024-07-26 ENCOUNTER — Other Ambulatory Visit: Payer: Self-pay | Admitting: Orthopedic Surgery

## 2024-07-28 ENCOUNTER — Other Ambulatory Visit (HOSPITAL_COMMUNITY): Payer: Self-pay

## 2024-07-28 ENCOUNTER — Telehealth: Payer: Self-pay | Admitting: Pharmacy Technician

## 2024-07-28 NOTE — Telephone Encounter (Signed)
 Pharmacy Patient Advocate Encounter   Received notification from Onbase that prior authorization for Omnipod 5 DexG7G6 Pods Gen 5 is required/requested.   Insurance verification completed.   The patient is insured through [facility].   Per test claim: PA required; PA submitted to above mentioned insurance via CoverMyMeds Key/confirmation #/EOC [id number] Status is pending

## 2024-07-28 NOTE — Telephone Encounter (Signed)
 Pharmacy Patient Advocate Encounter  Received notification from [facility] that Prior Authorization for Omnipod 5 DexG7G6 Pods Gen 5 has been APPROVED from 07/28/24 to 07/28/25. Ran test claim, Copay is $0.00. This test claim was processed through [facility]- copay amounts may vary at other pharmacies due to pharmacy/plan contracts, or as the patient moves through the different stages of their insurance plan.   PA #/Case ID/Reference #: [id number]

## 2024-08-10 ENCOUNTER — Ambulatory Visit: Admitting: Orthopedic Surgery

## 2024-08-10 ENCOUNTER — Encounter: Payer: Self-pay | Admitting: Orthopedic Surgery

## 2024-08-10 DIAGNOSIS — M7502 Adhesive capsulitis of left shoulder: Secondary | ICD-10-CM | POA: Diagnosis not present

## 2024-08-10 DIAGNOSIS — M25512 Pain in left shoulder: Secondary | ICD-10-CM | POA: Diagnosis not present
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
## 2024-08-16 ENCOUNTER — Other Ambulatory Visit: Payer: Self-pay

## 2024-08-16 DIAGNOSIS — E1065 Type 1 diabetes mellitus with hyperglycemia: Secondary | ICD-10-CM

## 2024-08-16 MED ORDER — DEXCOM G6 SENSOR MISC: 3.0000 | 12 refills | Status: AC

## 2024-08-23 ENCOUNTER — Encounter: Payer: Self-pay | Admitting: "Endocrinology

## 2024-08-23 ENCOUNTER — Ambulatory Visit: Admitting: "Endocrinology

## 2024-08-23 VITALS — BP 110/80 | HR 78 | Ht 68.0 in | Wt 213.0 lb

## 2024-08-23 DIAGNOSIS — E7800 Pure hypercholesterolemia, unspecified: Secondary | ICD-10-CM

## 2024-08-23 DIAGNOSIS — Z4681 Encounter for fitting and adjustment of insulin pump: Secondary | ICD-10-CM | POA: Diagnosis not present

## 2024-08-23 DIAGNOSIS — E1065 Type 1 diabetes mellitus with hyperglycemia: Secondary | ICD-10-CM | POA: Diagnosis not present

## 2024-08-23 DIAGNOSIS — Z7985 Long-term (current) use of injectable non-insulin antidiabetic drugs: Secondary | ICD-10-CM

## 2024-08-23 DIAGNOSIS — Z9641 Presence of insulin pump (external) (internal): Secondary | ICD-10-CM

## 2024-08-23 LAB — POCT GLYCOSYLATED HEMOGLOBIN (HGB A1C): Hemoglobin A1C: 7.7 % — AB (ref 4.0–5.6)

## 2024-08-23 NOTE — Progress Notes (Signed)
 Outpatient Endocrinology Note Andre Birmingham, MD  08/24/24   Andre Brown December 29, 1980 [id number]  Referring Provider: Rexanne Ingle, MD Primary Care Provider: Rexanne Ingle, MD Reason for consultation: Subjective   Assessment & Plan  Diagnoses and all orders for this visit:  Type 1 diabetes mellitus with hyperglycemia (HCC) -     Microalbumin / creatinine urine ratio -     POCT glycosylated hemoglobin (Hb A1C)  Insulin  pump in place  Insulin  pump titration  Long-term (current) use of injectable non-insulin  antidiabetic drugs  Pure hypercholesterolemia   Diabetes complicated by hypoglycemia Hba1c goal less than 7.0, current Hba1c is  Lab Results  Component Value Date   HGBA1C 7.7 (A) 08/23/2024   HGBA1C 6.9 (A) 05/18/2024   HGBA1C 6.8 (A) 12/03/2023   Will recommend the following: Continue INSULIN  pump settings with HUMALOG  U-200 insulin : BASAL rate 0.6 at midnight and 0.7 at 6 AM BOLUS settings: Carb ratio 1:2, Sensitivity = 1: 65, target 120 12 AM to 6 AM and then 110 Instructed not to bolus within 1 hr of meals, to carb count correctly  Active insulin  5 hours  Patient undershoots carbs to prevent hypoglycemia (not accurate in carb counting, will need DM education if BG continued to be irregular) Mounjaro  15 mg weekly - for weight loss. No S/E  Has Tresiba  and novolog  as back up   No known contraindications to any of above medications Glucagon  - pt has it  Hyperlipidemia -Last LDL near goal: 86 -on rosuvastatin 10 mg every day, not interested in dose increment, reinforced compliance previously  -Follow low fat diet and exercise   -Blood pressure goal <140/90 - Microalbumin/creatinine at goal < 30 -not on ACE/ARB  -diet changes including salt restriction -limit eating outside -counseled BP targets per standards of diabetes care -Uncontrolled blood pressure can lead to retinopathy, nephropathy and cardiovascular and atherosclerotic heart  disease  Reviewed and counseled on: -A1C target -Blood sugar targets -Complications of uncontrolled diabetes  -Checking blood sugar before meals and bedtime and bring log next visit -All medications with mechanism of action and side effects -Hypoglycemia management: rule of 15's, Glucagon  Emergency Kit and medical alert ID -low-carb low-fat plate-method diet -At least 20 minutes of physical activity per day -Annual dilated retinal eye exam and foot exam -compliance and follow up needs -follow up as scheduled or earlier if problem gets worse  Call if blood sugar is less than 70 or consistently above 250    Take a 15 gm snack of carbohydrate at bedtime before you go to sleep if your blood sugar is less than 100.    If you are going to fast after midnight for a test or procedure, ask your physician for instructions on how to reduce/decrease your insulin  dose.    Call if blood sugar is less than 70 or consistently above 250  -Treating a low sugar by rule of 15  (15 gms of sugar every 15 min until sugar is more than 70) If you feel your sugar is low, test your sugar to be sure If your sugar is low (less than 70), then take 15 grams of a fast acting Carbohydrate (3-4 glucose tablets or glucose gel or 4 ounces of juice or regular soda) Recheck your sugar 15 min after treating low to make sure it is more than 70 If sugar is still less than 70, treat again with 15 grams of carbohydrate          Don't drive the hour of  hypoglycemia  If unconscious/unable to eat or drink by mouth, use glucagon  injection or nasal spray baqsimi  and call 911. Can repeat again in 15 min if still unconscious.  Return in about 3 months (around 11/21/2024) for labs today.   I have reviewed current medications, nurse's notes, allergies, vital signs, past medical and surgical history, family medical history, and social history for this encounter. Counseled patient on symptoms, examination findings, lab findings, imaging  results, treatment decisions and monitoring and prognosis. The patient understood the recommendations and agrees with the treatment plan. All questions regarding treatment plan were fully answered.  Andre Birmingham, MD  08/24/24   History of Present Illness Andre Brown is a 43 y.o. year old male who presents for follow up of Type 1 diabetes mellitus.  Hypothyroidism managed by PCP  Andre Brown was first diagnosed of diabetes in 1999 at 50.   Diabetes education +  Home diabetes regimen: Mounjaro  12.5mg /week INSULIN  pump settings with HUMALOG  U-200 insulin : Continue INSULIN  pump settings with HUMALOG  U-200 insulin : BASAL rate 0.6 at midnight and 0.7 at 6 AM BOLUS settings: Carb ratio 1:2, Sensitivity = 1: 80, target 120 12 AM to 6 AM and then 110 Active insulin  5 hours  Non-insulin  hypoglycemic drugs: Mounjaro  15 mg weekly  Prior history:   He was reportedly started on insulin  2002 He has been taking Lantus insulin  previously and subsequently Tresiba  Also in 2022 he was given Trulicity   PREVIOUS INSULIN  regimen: 50 units Tresiba  in the morning daily NOVOLOG  mealtime doses 12 units + carbohydrate ratio 1:5  COMPLICATIONS -  MI/Stroke -  retinopathy, last eye exam 03/2024 -  neuropathy -  nephropathy  BLOOD SUGAR DATA CGM interpretation: At today's visit, we reviewed her CGM downloads. The full report is scanned in the media. Reviewing the CGM trends, BG are  elevated after break fast and dinner and improve between 6 am -10am. 6pm could be normal-low.   Physical Exam  BP 110/80   Pulse 78   Ht 5' 8 (1.727 m)   Wt 213 lb (96.6 kg)   SpO2 98%   BMI 32.39 kg/m    Constitutional: well developed, well nourished Head: normocephalic, atraumatic Eyes: sclera anicteric, no redness Neck: supple Lungs: normal respiratory effort Neurology: alert and oriented Skin: dry, no appreciable rashes Musculoskeletal: no appreciable defects Psychiatric: normal mood and  affect Diabetic Foot Exam - Simple   No data filed      Current Medications Patient's Medications  New Prescriptions   No medications on file  Previous Medications   B-D ULTRAFINE III SHORT PEN 31G X 8 MM MISC    USE 4 TIMES A DAY   CLOBETASOL CREAM (TEMOVATE) 0.05 %    Apply 1 Application topically 2 (two) times daily.   CONTINUOUS GLUCOSE SENSOR (DEXCOM G6 SENSOR) MISC    3 each by Other route See admin instructions. Apply one sensor to body once every 10 days; E11.9   CONTINUOUS GLUCOSE TRANSMITTER (DEXCOM G6 TRANSMITTER) MISC    USE AS DIRECTED   DICLOFENAC  (VOLTAREN ) 75 MG EC TABLET    TAKE 1 TABLET (75 MG TOTAL) BY MOUTH 2 (TWO) TIMES DAILY BETWEEN MEALS AS NEEDED.   FUROSEMIDE (LASIX) 20 MG TABLET    Take 20 mg by mouth daily as needed.   GLUCAGON  (GLUCAGON  EMERGENCY) 1 MG INJECTION    Inject 1 mg into the muscle once as needed for up to 1 dose.   INSULIN  ASPART (NOVOLOG  FLEXPEN) 100 UNIT/ML FLEXPEN  Inject 35 Units into the skin 3 (three) times daily with meals.   INSULIN  DEGLUDEC (TRESIBA  FLEXTOUCH) 100 UNIT/ML FLEXTOUCH PEN    Inject 50 Units into the skin daily.   INSULIN  DISPOSABLE PUMP (OMNIPOD 5 DEXG7G6 PODS GEN 5) MISC    CHANGE POD EVERY 3 DAYS AS DIRECTED   INSULIN  DISPOSABLE PUMP (OMNIPOD 5 G6 INTRO, GEN 5,) KIT    Change pod every 3 days   INSULIN  LISPRO (HUMALOG  KWIKPEN) 200 UNIT/ML KWIKPEN    Inject up to 100 units via insulin  pump daily   KETOCONAZOLE  (NIZORAL ) 2 % CREAM    Apply 1 application topically daily.   LEVOTHYROXINE  (SYNTHROID ) 150 MCG TABLET    TAKE 1 TABLET BY MOUTH EVERY DAY   MELOXICAM  (MOBIC ) 15 MG TABLET    TAKE 1 TABLET (15 MG TOTAL) BY MOUTH DAILY.   METHOCARBAMOL  (ROBAXIN ) 500 MG TABLET    Take 1 tablet (500 mg total) by mouth 2 (two) times daily.   OFLOXACIN  (FLOXIN ) 0.3 % OTIC SOLUTION    Place 5 drops into both ears 2 (two) times daily. For 5 days   ROSUVASTATIN (CRESTOR) 10 MG TABLET    Take 10 mg by mouth at bedtime.   SILDENAFIL  (VIAGRA )  100 MG TABLET    TAKE 0.5-1 TABLETS (50-100 MG TOTAL) DAILY AS NEEDED FOR ERECTILE DYSFUNCTION. (6 PER 25 DAYS)   SYRINGE/NEEDLE, DISP, 30G X 1/2 1 ML MISC    Use to inject insulin  into to pump   TIRZEPATIDE  (MOUNJARO ) 15 MG/0.5ML PEN    Inject 15 mg into the skin once a week.   TRIAMCINOLONE  CREAM (KENALOG ) 0.1 %    Apply 1 application topically 2 (two) times daily.  Modified Medications   No medications on file  Discontinued Medications   No medications on file    Allergies Allergies  Allergen Reactions   Iodinated Contrast Media Hives and Itching    After CT contrast   Lisinopril Other (See Comments)   Loratadine Other (See Comments)   Losartan Potassium Other (See Comments)   Shellfish Allergy     Itchy throat    Past Medical History Past Medical History:  Diagnosis Date   Diabetes mellitus without complication (HCC)    History of cataract    Hypothyroidism    SOBOE (shortness of breath on exertion)    Swelling of both lower extremities    Thyroid  disease     Past Surgical History Past Surgical History:  Procedure Laterality Date   CATARACT EXTRACTION, BILATERAL  2021   WISDOM TOOTH EXTRACTION  2015    Family History family history includes Breast cancer (age of onset: 78) in his mother; Cancer in his maternal grandmother and maternal great-grandmother; Colon polyps in his maternal uncle; Pancreatic cancer in an other family member.  Social History Social History   Socioeconomic History   Marital status: Married    Spouse name: Coady Train   Number of children: 2   Years of education: Not on file   Highest education level: Not on file  Occupational History   Occupation: Production Manager   Occupation: animal nutritionist  Tobacco Use   Smoking status: Never   Smokeless tobacco: Never  Vaping Use   Vaping status: Never Used  Substance and Sexual Activity   Alcohol use: Yes    Comment: Very limited   Drug use: No   Sexual activity: Yes    Birth  control/protection: None    Comment: Last encounter: first week of Sept 2024,  Recieved oral sex.  Other Topics Concern   Not on file  Social History Narrative   Not on file   Social Drivers of Health   Financial Resource Strain: Not on file  Food Insecurity: Not on file  Transportation Needs: Not on file  Physical Activity: Not on file  Stress: Not on file  Social Connections: Not on file  Intimate Partner Violence: Not on file    Lab Results  Component Value Date   HGBA1C 7.7 (A) 08/23/2024   HGBA1C 6.9 (A) 05/18/2024   HGBA1C 6.8 (A) 12/03/2023   Lab Results  Component Value Date   CHOL 152 05/18/2024   Lab Results  Component Value Date   HDL 55 05/18/2024   Lab Results  Component Value Date   LDLCALC 86 05/18/2024   Lab Results  Component Value Date   TRIG 40 05/18/2024   Lab Results  Component Value Date   CHOLHDL 2.8 05/18/2024   Lab Results  Component Value Date   CREATININE 1.30 (H) 05/20/2024   Lab Results  Component Value Date   GFR 70.55 11/21/2022   Lab Results  Component Value Date   MICROALBUR 0.3 08/23/2024       Component Value Date/Time   NA 140 05/20/2024 1715   K 3.9 05/20/2024 1715   CL 103 05/20/2024 1715   CO2 30 11/21/2022 0908   GLUCOSE 104 (H) 05/20/2024 1715   BUN 11 05/20/2024 1715   CREATININE 1.30 (H) 05/20/2024 1715   CREATININE 1.00 08/05/2014 1434   CALCIUM 9.3 11/21/2022 0908   PROT 7.1 11/21/2022 0908   ALBUMIN 4.1 11/21/2022 0908   AST 25 11/21/2022 0908   ALT 22 11/21/2022 0908   ALKPHOS 67 11/21/2022 0908   BILITOT 0.5 11/21/2022 0908   GFRNONAA >60 09/18/2022 1615   GFRAA >60 09/22/2018 1505      Latest Ref Rng & Units 05/20/2024    5:15 PM 11/21/2022    9:08 AM 09/18/2022    4:15 PM  BMP  Glucose 70 - 99 mg/dL 895  877  844   BUN 6 - 20 mg/dL 11  12  13    Creatinine 0.61 - 1.24 mg/dL 8.69  8.74  8.70   Sodium 135 - 145 mmol/L 140  139  137   Potassium 3.5 - 5.1 mmol/L 3.9  4.2  4.6   Chloride 98  - 111 mmol/L 103  103  105   CO2 19 - 32 mEq/L  30  27   Calcium 8.4 - 10.5 mg/dL  9.3  9.1        Component Value Date/Time   WBC 7.0 05/20/2024 1707   RBC 4.86 05/20/2024 1707   HGB 14.3 05/20/2024 1715   HCT 42.0 05/20/2024 1715   PLT 224 05/20/2024 1707   MCV 86.8 05/20/2024 1707   MCV 86.5 08/05/2014 1437   MCH 30.2 05/20/2024 1707   MCHC 34.8 05/20/2024 1707   RDW 12.0 05/20/2024 1707   LYMPHSABS 1.1 09/18/2022 1615   MONOABS 0.7 09/18/2022 1615   EOSABS 0.0 09/18/2022 1615   BASOSABS 0.0 09/18/2022 1615     Parts of this note may have been dictated using voice recognition software. There may be variances in spelling and vocabulary which are unintentional. Not all errors are proofread. Please notify the dino if any discrepancies are noted or if the meaning of any statement is not clear.

## 2024-08-24 LAB — MICROALBUMIN / CREATININE URINE RATIO
Creatinine, Urine: 190 mg/dL (ref 20–320)
Microalb Creat Ratio: 2 mg/g{creat} (ref <30)
Microalb, Ur: 0.3 mg/dL

## 2024-09-18 DIAGNOSIS — N481 Balanitis: Secondary | ICD-10-CM | POA: Diagnosis not present

## 2024-09-18 DIAGNOSIS — Z20818 Contact with and (suspected) exposure to other bacterial communicable diseases: Secondary | ICD-10-CM | POA: Diagnosis not present

## 2024-09-18 DIAGNOSIS — R300 Dysuria: Secondary | ICD-10-CM | POA: Diagnosis not present

## 2024-09-18 DIAGNOSIS — Z2089 Contact with and (suspected) exposure to other communicable diseases: Secondary | ICD-10-CM | POA: Diagnosis not present

## 2024-10-11 ENCOUNTER — Other Ambulatory Visit: Payer: Self-pay | Admitting: "Endocrinology

## 2024-10-13 NOTE — Telephone Encounter (Signed)
Requested Prescriptions     Pending Prescriptions Disp Refills   . levothyroxine (SYNTHROID) 150 MCG tablet [Pharmacy Med Name: LEVOTHYROXINE 150 MCG TABLET] 90 tablet 1     Sig: TAKE 1 TABLET BY MOUTH EVERY DAY

## 2024-11-23 ENCOUNTER — Ambulatory Visit: Admitting: "Endocrinology
